# Patient Record
Sex: Male | Born: 1935 | Race: Black or African American | Hispanic: No | Marital: Married | Smoking: Former smoker
Health system: Southern US, Community
[De-identification: ages and names within clinical notes are randomized; demographics above are authoritative.]

## PROBLEM LIST (undated history)

## (undated) DIAGNOSIS — F329 Major depressive disorder, single episode, unspecified: Secondary | ICD-10-CM

## (undated) DIAGNOSIS — K219 Gastro-esophageal reflux disease without esophagitis: Secondary | ICD-10-CM

## (undated) DIAGNOSIS — F32A Depression, unspecified: Secondary | ICD-10-CM

## (undated) DIAGNOSIS — S0990XA Unspecified injury of head, initial encounter: Secondary | ICD-10-CM

## (undated) DIAGNOSIS — K08109 Complete loss of teeth, unspecified cause, unspecified class: Secondary | ICD-10-CM

## (undated) DIAGNOSIS — I10 Essential (primary) hypertension: Secondary | ICD-10-CM

## (undated) DIAGNOSIS — E7800 Pure hypercholesterolemia, unspecified: Secondary | ICD-10-CM

## (undated) DIAGNOSIS — E119 Type 2 diabetes mellitus without complications: Secondary | ICD-10-CM

## (undated) DIAGNOSIS — F419 Anxiety disorder, unspecified: Secondary | ICD-10-CM

## (undated) DIAGNOSIS — F0390 Unspecified dementia without behavioral disturbance: Secondary | ICD-10-CM

## (undated) DIAGNOSIS — R561 Post traumatic seizures: Secondary | ICD-10-CM

## (undated) DIAGNOSIS — D649 Anemia, unspecified: Secondary | ICD-10-CM

## (undated) DIAGNOSIS — Z972 Presence of dental prosthetic device (complete) (partial): Secondary | ICD-10-CM

## (undated) HISTORY — PX: JOINT REPLACEMENT: SHX530

## (undated) HISTORY — PX: APPENDECTOMY: SHX54

---

## 1961-04-30 HISTORY — PX: BACK SURGERY: SHX140

## 2005-08-07 ENCOUNTER — Ambulatory Visit: Payer: Self-pay | Admitting: Unknown Physician Specialty

## 2008-11-02 ENCOUNTER — Ambulatory Visit: Payer: Self-pay | Admitting: Unknown Physician Specialty

## 2008-12-14 ENCOUNTER — Ambulatory Visit: Payer: Self-pay | Admitting: Unknown Physician Specialty

## 2009-09-06 ENCOUNTER — Ambulatory Visit: Payer: Self-pay | Admitting: Ophthalmology

## 2012-03-24 DIAGNOSIS — N401 Enlarged prostate with lower urinary tract symptoms: Secondary | ICD-10-CM | POA: Insufficient documentation

## 2012-03-24 DIAGNOSIS — N400 Enlarged prostate without lower urinary tract symptoms: Secondary | ICD-10-CM | POA: Insufficient documentation

## 2012-03-24 DIAGNOSIS — N529 Male erectile dysfunction, unspecified: Secondary | ICD-10-CM | POA: Insufficient documentation

## 2012-10-15 ENCOUNTER — Ambulatory Visit: Payer: Self-pay

## 2013-04-30 DIAGNOSIS — S065XAA Traumatic subdural hemorrhage with loss of consciousness status unknown, initial encounter: Secondary | ICD-10-CM

## 2013-04-30 HISTORY — DX: Traumatic subdural hemorrhage with loss of consciousness status unknown, initial encounter: S06.5XAA

## 2013-07-07 ENCOUNTER — Emergency Department: Payer: Self-pay | Admitting: Emergency Medicine

## 2013-07-07 DIAGNOSIS — S065X9A Traumatic subdural hemorrhage with loss of consciousness of unspecified duration, initial encounter: Secondary | ICD-10-CM | POA: Insufficient documentation

## 2013-07-07 DIAGNOSIS — R569 Unspecified convulsions: Secondary | ICD-10-CM | POA: Insufficient documentation

## 2013-07-07 DIAGNOSIS — S065XAA Traumatic subdural hemorrhage with loss of consciousness status unknown, initial encounter: Secondary | ICD-10-CM | POA: Insufficient documentation

## 2013-07-07 LAB — CBC WITH DIFFERENTIAL/PLATELET
BASOS PCT: 0.7 %
Basophil #: 0.1 10*3/uL (ref 0.0–0.1)
EOS PCT: 2.5 %
Eosinophil #: 0.2 10*3/uL (ref 0.0–0.7)
HCT: 34.4 % — ABNORMAL LOW (ref 40.0–52.0)
HGB: 11.1 g/dL — ABNORMAL LOW (ref 13.0–18.0)
LYMPHS ABS: 1.7 10*3/uL (ref 1.0–3.6)
Lymphocyte %: 21.7 %
MCH: 30.8 pg (ref 26.0–34.0)
MCHC: 32.3 g/dL (ref 32.0–36.0)
MCV: 96 fL (ref 80–100)
MONO ABS: 0.8 x10 3/mm (ref 0.2–1.0)
Monocyte %: 9.9 %
Neutrophil #: 5.2 10*3/uL (ref 1.4–6.5)
Neutrophil %: 65.2 %
PLATELETS: 196 10*3/uL (ref 150–440)
RBC: 3.6 10*6/uL — AB (ref 4.40–5.90)
RDW: 13.5 % (ref 11.5–14.5)
WBC: 8 10*3/uL (ref 3.8–10.6)

## 2013-07-07 LAB — COMPREHENSIVE METABOLIC PANEL
ALBUMIN: 3.1 g/dL — AB (ref 3.4–5.0)
AST: 12 U/L — AB (ref 15–37)
Alkaline Phosphatase: 86 U/L
Anion Gap: 4 — ABNORMAL LOW (ref 7–16)
BUN: 15 mg/dL (ref 7–18)
Bilirubin,Total: 0.4 mg/dL (ref 0.2–1.0)
CHLORIDE: 103 mmol/L (ref 98–107)
Calcium, Total: 9.1 mg/dL (ref 8.5–10.1)
Co2: 33 mmol/L — ABNORMAL HIGH (ref 21–32)
Creatinine: 0.88 mg/dL (ref 0.60–1.30)
EGFR (African American): >60
EGFR (Non-African Amer.): >60
GLUCOSE: 104 mg/dL — AB (ref 65–99)
Osmolality: 281 (ref 275–301)
Potassium: 3 mmol/L — ABNORMAL LOW (ref 3.5–5.1)
SGPT (ALT): 14 U/L (ref 12–78)
Sodium: 140 mmol/L (ref 136–145)
Total Protein: 6.8 g/dL (ref 6.4–8.2)

## 2013-07-07 LAB — PROTIME-INR
INR: 1.1
Prothrombin Time: 13.6 secs (ref 11.5–14.7)

## 2013-07-09 DIAGNOSIS — M898X5 Other specified disorders of bone, thigh: Secondary | ICD-10-CM | POA: Insufficient documentation

## 2013-09-11 DIAGNOSIS — M169 Osteoarthritis of hip, unspecified: Secondary | ICD-10-CM | POA: Insufficient documentation

## 2014-01-25 DIAGNOSIS — Z87438 Personal history of other diseases of male genital organs: Secondary | ICD-10-CM | POA: Insufficient documentation

## 2014-01-25 DIAGNOSIS — R7302 Impaired glucose tolerance (oral): Secondary | ICD-10-CM | POA: Insufficient documentation

## 2014-01-25 DIAGNOSIS — K30 Functional dyspepsia: Secondary | ICD-10-CM | POA: Insufficient documentation

## 2014-09-08 ENCOUNTER — Encounter: Payer: Self-pay | Admitting: *Deleted

## 2014-09-13 NOTE — Discharge Instructions (Signed)
Follow-Up Appointment is: Thursday, May 19 @ 10:50 am   Cataract Surgery Care After Refer to this sheet in the next few weeks. These instructions provide you with information on caring for yourself after your procedure. Your caregiver may also give you more specific instructions. Your treatment has been planned according to current medical practices, but problems sometimes occur. Call your caregiver if you have any problems or questions after your procedure.  HOME CARE INSTRUCTIONS   Avoid strenuous activities as directed by your caregiver.  Ask your caregiver when you can resume driving.  Use eyedrops or other medicines to help healing and control pressure inside your eye as directed by your caregiver.  Only take over-the-counter or prescription medicines for pain, discomfort, or fever as directed by your caregiver.  Do not to touch or rub your eyes.  You may be instructed to use a protective shield during the first few days and nights after surgery. If not, wear sunglasses to protect your eyes. This is to protect the eye from pressure or from being accidentally bumped.  Keep the area around your eye clean and dry. Avoid swimming or allowing water to hit you directly in the face while showering. Keep soap and shampoo out of your eyes.  Do not bend or lift heavy objects. Bending increases pressure in the eye. You can walk, climb stairs, and do light household chores.  Do not put a contact lens into the eye that had surgery until your caregiver says it is okay to do so.  Ask your doctor when you can return to work. This will depend on the kind of work that you do. If you work in a dusty environment, you may be advised to wear protective eyewear for a period of time.  Ask your caregiver when it will be safe to engage in sexual activity.  Continue with your regular eye exams as directed by your caregiver. What to expect:  It is normal to feel itching and mild discomfort for a few days  after cataract surgery. Some fluid discharge is also common, and your eye may be sensitive to light and touch.  After 1 to 2 days, even moderate discomfort should disappear. In most cases, healing will take about 6 weeks.  If you received an intraocular lens (IOL), you may notice that colors are very bright or have a blue tinge. Also, if you have been in bright sunlight, everything may appear reddish for a few hours. If you see these color tinges, it is because your lens is clear and no longer cloudy. Within a few months after receiving an IOL, these extra colors should go away. When you have healed, you will probably need new glasses. SEEK MEDICAL CARE IF:   You have increased bruising around your eye.  You have discomfort not helped by medicine. SEEK IMMEDIATE MEDICAL CARE IF:   You have a fever.  You have a worsening or sudden vision loss.  You have redness, swelling, or increasing pain in the eye.  You have a thick discharge from the eye that had surgery. MAKE SURE YOU:  Understand these instructions.  Will watch your condition.  Will get help right away if you are not doing well or get worse. Document Released: 11/03/2004 Document Revised: 07/09/2011 Document Reviewed: 12/08/2010 [facility] Patient Information 2015 [place], [place]. This information is not intended to replace advice given to you by your health care provider. Make sure you discuss any questions you have with your health care provider. General Anesthesia,  Care After Refer to this sheet in the next few weeks. These instructions provide you with information on caring for yourself after your procedure. Your health care provider may also give you more specific instructions. Your treatment has been planned according to current medical practices, but problems sometimes occur. Call your health care provider if you have any problems or questions after your procedure. WHAT TO EXPECT AFTER THE PROCEDURE After the procedure, it  is typical to experience:  Sleepiness.  Nausea and vomiting. HOME CARE INSTRUCTIONS  For the first 24 hours after general anesthesia:  Have a responsible person with you.  Do not drive a car. If you are alone, do not take public transportation.  Do not drink alcohol.  Do not take medicine that has not been prescribed by your health care provider.  Do not sign important papers or make important decisions.  You may resume a normal diet and activities as directed by your health care provider.  Change bandages (dressings) as directed.  If you have questions or problems that seem related to general anesthesia, call the hospital and ask for the anesthetist or anesthesiologist on call. SEEK MEDICAL CARE IF:  You have nausea and vomiting that continue the day after anesthesia.  You develop a rash. SEEK IMMEDIATE MEDICAL CARE IF:   You have difficulty breathing.  You have chest pain.  You have any allergic problems. Document Released: 07/23/2000 Document Revised: 04/21/2013 Document Reviewed: 10/30/2012 [facility] Patient Information 2015 [place], [place]. This information is not intended to replace advice given to you by your health care provider. Make sure you discuss any questions you have with your health care provider.

## 2014-09-15 ENCOUNTER — Encounter
Admission: RE | Disposition: A | Discharge status: Home / Self Care | Payer: Self-pay | Source: Ambulatory Visit | Attending: Ophthalmology

## 2014-09-15 ENCOUNTER — Ambulatory Visit: Payer: Medicare PPO | Admitting: Anesthesiology

## 2014-09-15 ENCOUNTER — Ambulatory Visit
Admission: RE | Admit: 2014-09-15 | Discharge: 2014-09-15 | Disposition: A | Discharge status: Home / Self Care | Payer: Medicare PPO | Source: Ambulatory Visit | Attending: Ophthalmology | Admitting: Ophthalmology

## 2014-09-15 ENCOUNTER — Encounter: Payer: Self-pay | Admitting: Anesthesiology

## 2014-09-15 DIAGNOSIS — R569 Unspecified convulsions: Secondary | ICD-10-CM | POA: Insufficient documentation

## 2014-09-15 DIAGNOSIS — Z79899 Other long term (current) drug therapy: Secondary | ICD-10-CM | POA: Insufficient documentation

## 2014-09-15 DIAGNOSIS — I10 Essential (primary) hypertension: Secondary | ICD-10-CM | POA: Insufficient documentation

## 2014-09-15 DIAGNOSIS — K219 Gastro-esophageal reflux disease without esophagitis: Secondary | ICD-10-CM | POA: Diagnosis not present

## 2014-09-15 DIAGNOSIS — F329 Major depressive disorder, single episode, unspecified: Secondary | ICD-10-CM | POA: Diagnosis not present

## 2014-09-15 DIAGNOSIS — Z79891 Long term (current) use of opiate analgesic: Secondary | ICD-10-CM | POA: Insufficient documentation

## 2014-09-15 DIAGNOSIS — E780 Pure hypercholesterolemia: Secondary | ICD-10-CM | POA: Insufficient documentation

## 2014-09-15 DIAGNOSIS — H2512 Age-related nuclear cataract, left eye: Secondary | ICD-10-CM | POA: Insufficient documentation

## 2014-09-15 DIAGNOSIS — Z96652 Presence of left artificial knee joint: Secondary | ICD-10-CM | POA: Insufficient documentation

## 2014-09-15 DIAGNOSIS — M1990 Unspecified osteoarthritis, unspecified site: Secondary | ICD-10-CM | POA: Diagnosis not present

## 2014-09-15 DIAGNOSIS — Z86010 Personal history of colonic polyps: Secondary | ICD-10-CM | POA: Insufficient documentation

## 2014-09-15 DIAGNOSIS — H269 Unspecified cataract: Secondary | ICD-10-CM | POA: Diagnosis present

## 2014-09-15 DIAGNOSIS — Z87820 Personal history of traumatic brain injury: Secondary | ICD-10-CM | POA: Insufficient documentation

## 2014-09-15 DIAGNOSIS — E119 Type 2 diabetes mellitus without complications: Secondary | ICD-10-CM | POA: Insufficient documentation

## 2014-09-15 DIAGNOSIS — Z87891 Personal history of nicotine dependence: Secondary | ICD-10-CM | POA: Diagnosis not present

## 2014-09-15 HISTORY — DX: Presence of dental prosthetic device (complete) (partial): Z97.2

## 2014-09-15 HISTORY — DX: Essential (primary) hypertension: I10

## 2014-09-15 HISTORY — DX: Unspecified dementia, unspecified severity, without behavioral disturbance, psychotic disturbance, mood disturbance, and anxiety: F03.90

## 2014-09-15 HISTORY — DX: Anemia, unspecified: D64.9

## 2014-09-15 HISTORY — PX: CATARACT EXTRACTION W/PHACO: SHX586

## 2014-09-15 HISTORY — DX: Post traumatic seizures: R56.1

## 2014-09-15 HISTORY — DX: Complete loss of teeth, unspecified cause, unspecified class: K08.109

## 2014-09-15 HISTORY — DX: Anxiety disorder, unspecified: F41.9

## 2014-09-15 HISTORY — DX: Unspecified injury of head, initial encounter: S09.90XA

## 2014-09-15 LAB — GLUCOSE, CAPILLARY: GLUCOSE-CAPILLARY: 164 mg/dL — AB (ref 65–99)

## 2014-09-15 SURGERY — PHACOEMULSIFICATION, CATARACT, WITH IOL INSERTION
Anesthesia: Monitor Anesthesia Care | Laterality: Left | Wound class: Clean

## 2014-09-15 MED ORDER — CEFUROXIME OPHTHALMIC INJECTION 1 MG/0.1 ML
INJECTION | OPHTHALMIC | Status: DC | PRN
  Administered 2014-09-15: .3 mL via INTRACAMERAL

## 2014-09-15 MED ORDER — BSS IO SOLN
INTRAOCULAR | Status: DC | PRN
  Administered 2014-09-15: 15 mL via INTRAOCULAR
  Administered 2014-09-15: 500 mL via INTRAOCULAR

## 2014-09-15 MED ORDER — MIDAZOLAM HCL 2 MG/2ML IJ SOLN
INTRAMUSCULAR | Status: DC | PRN
  Administered 2014-09-15: 1 mg via INTRAVENOUS

## 2014-09-15 MED ORDER — CARBACHOL 0.01 % IO SOLN
INTRAOCULAR | Status: DC | PRN
  Administered 2014-09-15: .2 mL via INTRAOCULAR

## 2014-09-15 MED ORDER — TIMOLOL MALEATE 0.5 % OP SOLN
OPHTHALMIC | Status: DC | PRN
  Administered 2014-09-15: 1 [drp] via OPHTHALMIC

## 2014-09-15 MED ORDER — TETRACAINE HCL 0.5 % OP SOLN
1.0000 [drp] | Freq: Once | OPHTHALMIC | Status: AC
  Administered 2014-09-15: 1 [drp] via OPHTHALMIC

## 2014-09-15 MED ORDER — NA HYALUR & NA CHOND-NA HYALUR 0.4-0.35 ML IO KIT
PACK | INTRAOCULAR | Status: DC | PRN
  Administered 2014-09-15: 1 mL via INTRAOCULAR

## 2014-09-15 MED ORDER — ARMC OPHTHALMIC DILATING GEL
1.0000 "application " | OPHTHALMIC | Status: DC | PRN
  Administered 2014-09-15 (×2): 1 via OPHTHALMIC

## 2014-09-15 MED ORDER — EPINEPHRINE HCL 1 MG/ML IJ SOLN
INTRAMUSCULAR | Status: DC | PRN
  Administered 2014-09-15: 1 mg

## 2014-09-15 MED ORDER — ACETAMINOPHEN 325 MG PO TABS: 325.0000 mg | ORAL_TABLET | ORAL | Status: DC | PRN

## 2014-09-15 MED ORDER — FENTANYL CITRATE (PF) 100 MCG/2ML IJ SOLN
INTRAMUSCULAR | Status: DC | PRN
  Administered 2014-09-15: 50 ug via INTRAVENOUS

## 2014-09-15 MED ORDER — BRIMONIDINE TARTRATE 0.2 % OP SOLN
OPHTHALMIC | Status: DC | PRN
  Administered 2014-09-15: 1 [drp] via OPHTHALMIC

## 2014-09-15 MED ORDER — ACETAMINOPHEN 160 MG/5ML PO SOLN: 325.0000 mg | ORAL | Status: DC | PRN

## 2014-09-15 MED ORDER — POVIDONE-IODINE 5 % OP SOLN
1.0000 "application " | Freq: Once | OPHTHALMIC | Status: AC
  Administered 2014-09-15: 1 via OPHTHALMIC

## 2014-09-15 SURGICAL SUPPLY — 27 items
CANNULA ANT/CHMB 27G (MISCELLANEOUS) ×1 IMPLANT
CANNULA ANT/CHMB 27GA (MISCELLANEOUS) ×6 IMPLANT
GLOVE SURG LX 7.5 STRW (GLOVE) ×2
GLOVE SURG LX STRL 7.5 STRW (GLOVE) ×1 IMPLANT
GLOVE SURG TRIUMPH 8.0 PF LTX (GLOVE) ×3 IMPLANT
GOWN STRL REUS W/ TWL LRG LVL3 (GOWN DISPOSABLE) ×2 IMPLANT
GOWN STRL REUS W/TWL LRG LVL3 (GOWN DISPOSABLE) ×6
LENS IOL ACRSF IQ PC 19.0 (Intraocular Lens) IMPLANT
LENS IOL ACRYSOF IQ POST 19.0 (Intraocular Lens) ×3 IMPLANT
MARKER SKIN SURG W/RULER VIO (MISCELLANEOUS) ×3 IMPLANT
NDL FILTER BLUNT 18X1 1/2 (NEEDLE) ×1 IMPLANT
NDL RETROBULBAR .5 NSTRL (NEEDLE) IMPLANT
NEEDLE FILTER BLUNT 18X 1/2SAF (NEEDLE) ×4
NEEDLE FILTER BLUNT 18X1 1/2 (NEEDLE) ×2 IMPLANT
PACK CATARACT BRASINGTON (MISCELLANEOUS) ×3 IMPLANT
PACK EYE AFTER SURG (MISCELLANEOUS) ×3 IMPLANT
PACK OPTHALMIC (MISCELLANEOUS) ×3 IMPLANT
RING MALYGIN 7.0 (MISCELLANEOUS) IMPLANT
SUT ETHILON 10-0 CS-B-6CS-B-6 (SUTURE)
SUT VICRYL  9 0 (SUTURE)
SUT VICRYL 9 0 (SUTURE) IMPLANT
SUTURE EHLN 10-0 CS-B-6CS-B-6 (SUTURE) IMPLANT
SYR 3ML LL SCALE MARK (SYRINGE) ×5 IMPLANT
SYR 5ML LL (SYRINGE) IMPLANT
SYR TB 1ML LUER SLIP (SYRINGE) ×3 IMPLANT
WATER STERILE IRR 500ML POUR (IV SOLUTION) ×3 IMPLANT
WIPE NON LINTING 3.25X3.25 (MISCELLANEOUS) ×3 IMPLANT

## 2014-09-15 NOTE — Anesthesia Postprocedure Evaluation (Signed)
  Anesthesia Post-op Note  Patient: Tim Henry, Tim Henry  Procedure(s) Performed: Procedure(s) with comments: CATARACT EXTRACTION PHACO AND INTRAOCULAR LENS PLACEMENT (IOC) (Left) - DIABETIC PT WOULD LIKE TO HAVE ARRIVAL TIME LATE AM  Anesthesia type:MAC  Patient location: PACU  Post pain: Pain level controlled  Post assessment: Post-op Vital signs reviewed, Patient's Cardiovascular Status Stable, Respiratory Function Stable, Patent Airway and No signs of Nausea or vomiting  Post vital signs: Reviewed and stable  Last Vitals:  Filed Vitals:   09/15/14 1214  BP:   Pulse:   Temp: 36.5 C  Resp:     Level of consciousness: awake, alert  and patient cooperative  Complications: No apparent anesthesia complications

## 2014-09-15 NOTE — H&P (Signed)
  The History and Physical notes were scanned in.  The patient remains stable and unchanged from the H&P.   Previous H&P reviewed, patient examined, and there are no changes.  Tim Henry 09/15/2014 10:37 AM

## 2014-09-15 NOTE — Transfer of Care (Signed)
Immediate Anesthesia Transfer of Care Note  Patient: Tim Henry  Procedure(s) Performed: Procedure(s) with comments: CATARACT EXTRACTION PHACO AND INTRAOCULAR LENS PLACEMENT (IOC) (Left) - DIABETIC PT WOULD LIKE TO HAVE ARRIVAL TIME LATE AM  Patient Location: PACU  Anesthesia Type: MAC  Level of Consciousness: awake, alert  and patient cooperative  Airway and Oxygen Therapy: Patient Spontanous Breathing and Patient connected to supplemental oxygen  Post-op Assessment: Post-op Vital signs reviewed, Patient's Cardiovascular Status Stable, Respiratory Function Stable, Patent Airway and No signs of Nausea or vomiting  Post-op Vital Signs: Reviewed and stable  Complications: No apparent anesthesia complications

## 2014-09-15 NOTE — Anesthesia Preprocedure Evaluation (Signed)
Anesthesia Evaluation  Patient identified by MRN, date of birth, ID band  Reviewed: Allergy & Precautions, H&P , NPO status , Patient's Chart, lab work & pertinent test results  Airway Mallampati: II  TM Distance: >3 FB Neck ROM: full    Dental no notable dental hx. (+) Upper Dentures, Lower Dentures   Pulmonary former smoker,    Pulmonary exam normal       Cardiovascular hypertension, Rhythm:regular Rate:Normal     Neuro/Psych Seizures -,     GI/Hepatic   Endo/Other    Renal/GU      Musculoskeletal   Abdominal   Peds  Hematology   Anesthesia Other Findings   Reproductive/Obstetrics                             Anesthesia Physical Anesthesia Plan  ASA: II  Anesthesia Plan: MAC   Post-op Pain Management:    Induction:   Airway Management Planned:   Additional Equipment:   Intra-op Plan:   Post-operative Plan:   Informed Consent: I have reviewed the patients History and Physical, chart, labs and discussed the procedure including the risks, benefits and alternatives for the proposed anesthesia with the patient or authorized representative who has indicated his/her understanding and acceptance.     Plan Discussed with: CRNA  Anesthesia Plan Comments:         Anesthesia Quick Evaluation

## 2014-09-15 NOTE — Op Note (Signed)
OPERATIVE NOTE  Tim Henry [id number] 09/15/2014   PREOPERATIVE DIAGNOSIS:  Nuclear sclerotic cataract left eye. H25.12   POSTOPERATIVE DIAGNOSIS:    Nuclear sclerotic cataract left eye.     PROCEDURE:  Phacoemusification with posterior chamber intraocular lens placement of the left eye   LENS:   Implant Name Type Inv. Item Serial No. Manufacturer Lot No. LRB No. Used  IMPLANT LENS - [id number] Intraocular Lens IMPLANT LENS [id number] 169 [employer]CON   Left 1     [id number] 22.0 diopter PCIOL   ULTRASOUND TIME: 13 of 1 minutes 20 seconds, CDE 10.8  SURGEON:  Deirdre Evenerhadwick R. Sweta Halseth, MD   ANESTHESIA:  Topical with tetracaine drops and 2% Xylocaine jelly.   COMPLICATIONS:  None.   DESCRIPTION OF PROCEDURE:  The patient was identified in the holding room and transported to the operating room and placed in the supine position under the operating microscope.  The left eye was identified as the operative eye and it was prepped and draped in the usual sterile ophthalmic fashion.   A 1 millimeter clear-corneal paracentesis was made at the 1:30 position.  The anterior chamber was filled with Viscoat viscoelastic.  A 2.4 millimeter keratome was used to make a near-clear corneal incision at the 10:30 position.  .  A curvilinear capsulorrhexis was made with a cystotome and capsulorrhexis forceps.  Balanced salt solution was used to hydrodissect and hydrodelineate the nucleus.   Phacoemulsification was then used in stop and chop fashion to remove the lens nucleus and epinucleus.  The remaining cortex was then removed using the irrigation and aspiration handpiece. Provisc was then placed into the capsular bag to distend it for lens placement.  A lens was then injected into the capsular bag.  The remaining viscoelastic was aspirated.   Wounds were hydrated with balanced salt solution.  The anterior chamber was inflated to a physiologic pressure with balanced salt solution. Miostat was placed into the anterior  chamber.  No wound leaks were noted. Cefuroxime 0.1 ml of a 10mg /ml solution was injected into the anterior chamber for a dose of 1 mg of intracameral antibiotic at the completion of the case.   Timolol and Brimonidine drops were applied to the eye.  The patient was taken to the recovery room in stable condition without complications of anesthesia or surgery.  [name] 09/15/2014, 12:11 PM

## 2014-09-16 ENCOUNTER — Encounter: Payer: Self-pay | Admitting: Ophthalmology

## 2015-01-11 IMAGING — CT CT HEAD WITHOUT CONTRAST
3 of 5 series · 14 of 47 positions shown, 16 images · non-contrast
Comparison: none

[Series 6: sagittal bone · sagittal · 0.37mm/px · 3 of 76 slices shown]
[im 26/76  brain]
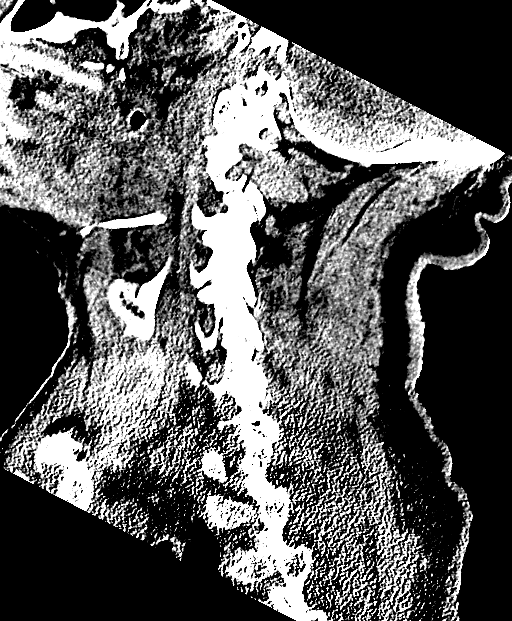
[im 38/76  brain]
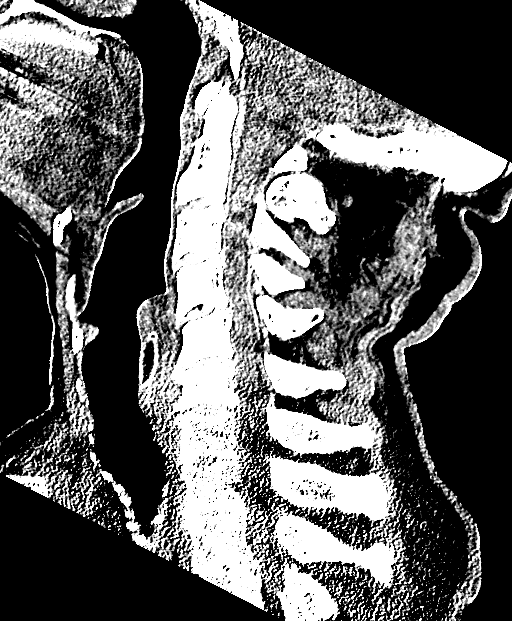
[im 51/76  brain]
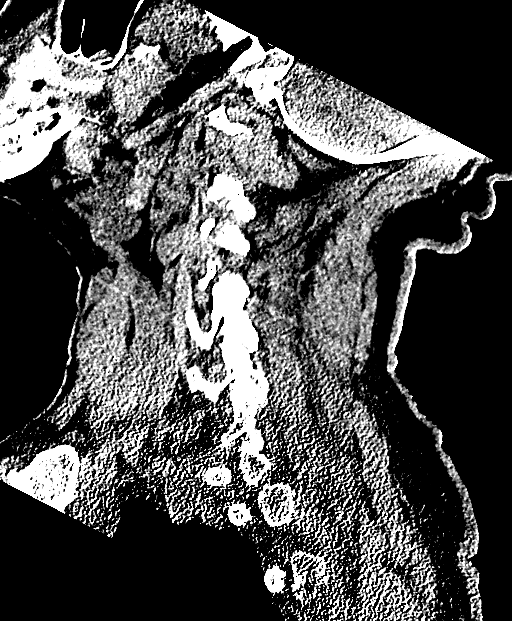

[Series 7: coronal bone · coronal · 0.37mm/px · 3 of 52 slices shown]
[im 18/52  brain]
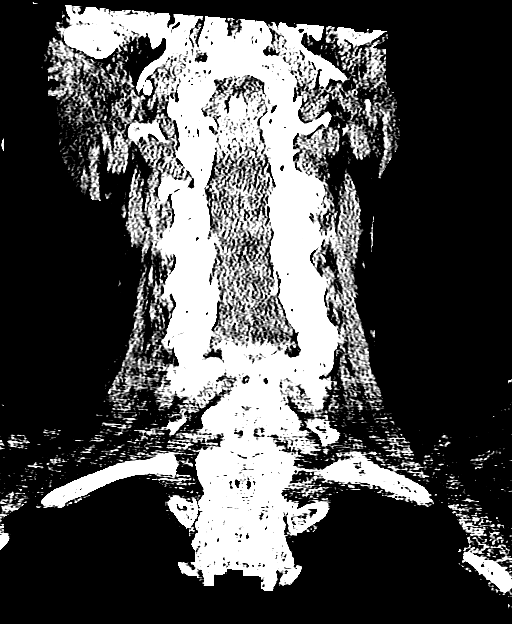
[im 23/52  brain]
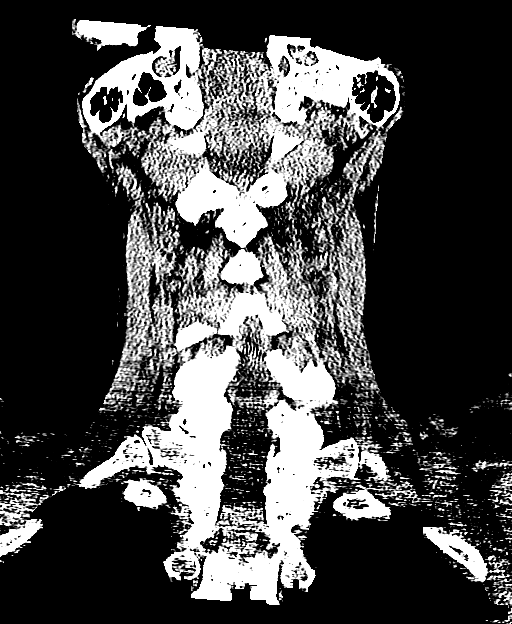
[im 29/52  brain]
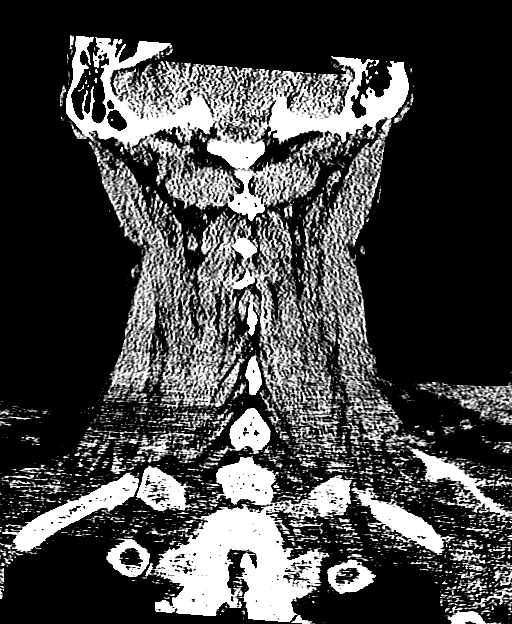

[Series 8: axial · axial · 0.31mm/px · z∈[-216,-62]mm · 8 of 105 slices shown, 10 images]
[im 9/105  brain]
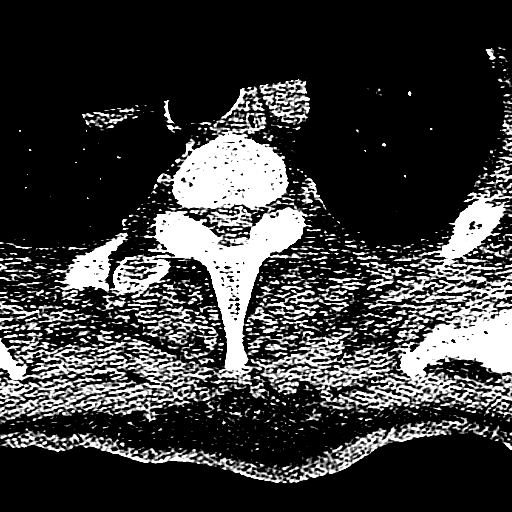
[im 9/105  bone]
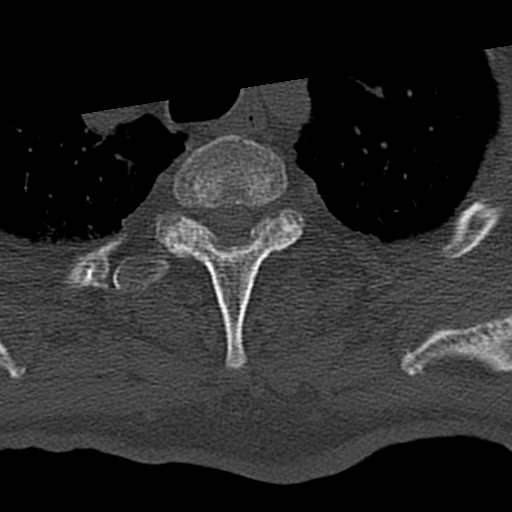
[im 25/105  brain]
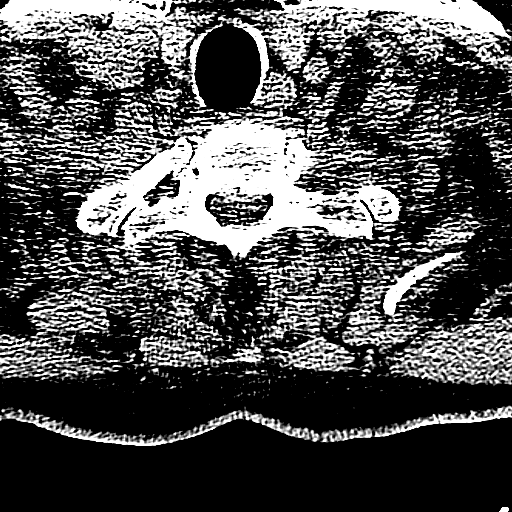
[im 33/105  brain]
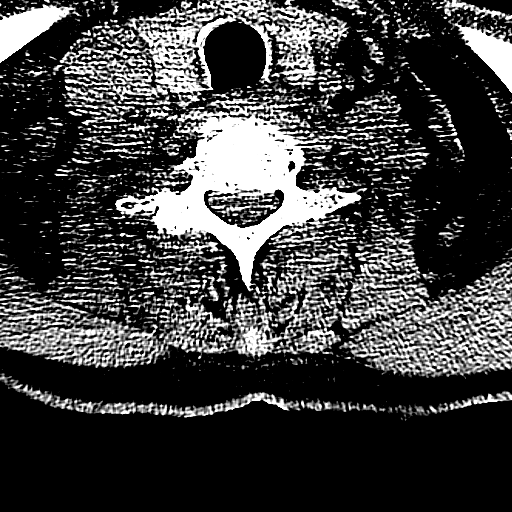
[im 49/105  brain]
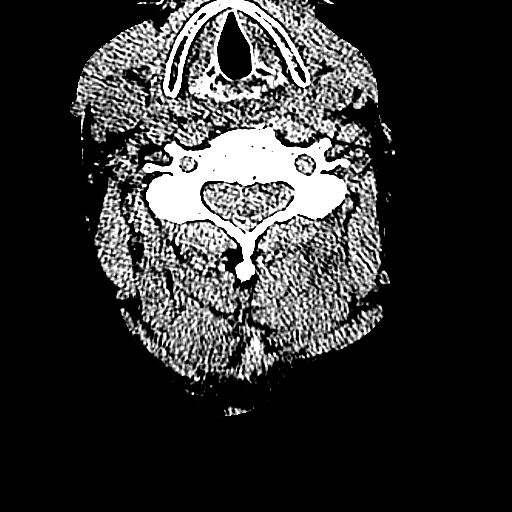
[im 57/105  brain]
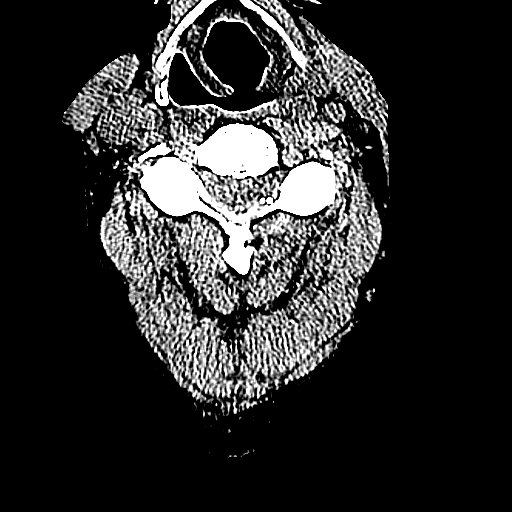
[im 57/105  bone]
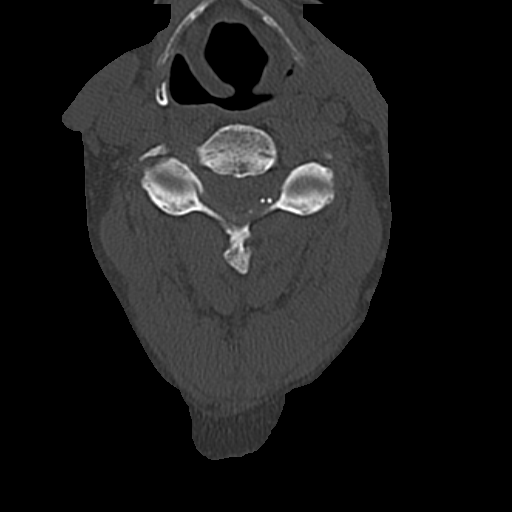
[im 73/105  brain]
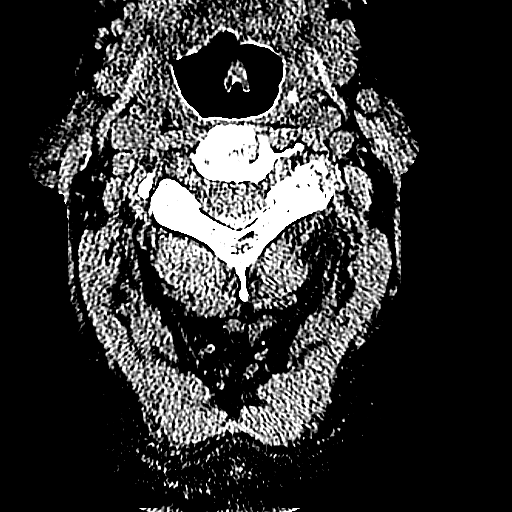
[im 81/105  brain]
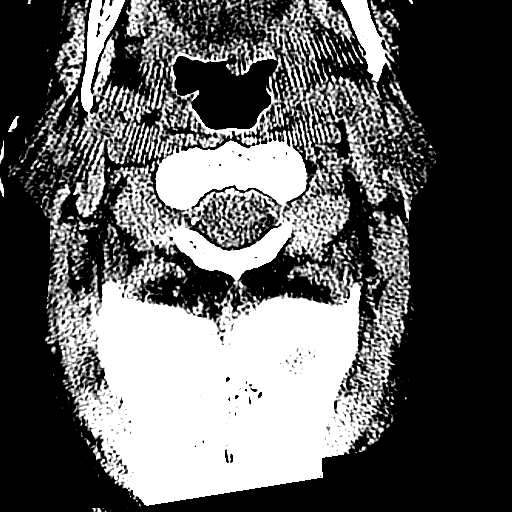
[im 97/105  brain]
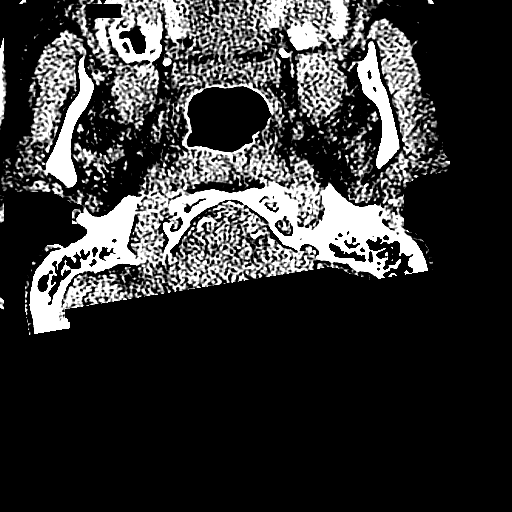

[14 of 47 positions shown; findings below may reference images not displayed]

CLINICAL DATA
Fall 3 weeks ago, healing head laceration, headache, neck pain

EXAM
CT HEAD WITHOUT CONTRAST

CT CERVICAL SPINE WITHOUT CONTRAST

TECHNIQUE
Multidetector CT imaging of the head and cervical spine was
performed following the standard protocol without intravenous
contrast. Multiplanar CT image reconstructions of the cervical spine
were also generated.

COMPARISON
None.

FINDINGS
CT HEAD FINDINGS

Moderate subacute to chronic subdural hematoma overlying the right
hemisphere, measuring up to 22 mm in thickness (series 2/image 15).

Underlying mass effect with 9 mm leftward midline shift.

No CT evidence of acute infarction.

Basal cisterns are patent.

Cerebral volume is within normal limits.  No ventriculomegaly.

The visualized paranasal sinuses are essentially clear. The mastoid
air cells are unopacified.

No evidence of calvarial fracture.

CT CERVICAL SPINE FINDINGS

Straightening of the cervical spine.

No evidence of fracture or dislocation. Vertebral body heights are
maintained. Dens appears intact.

No prevertebral soft tissue swelling.

Mild to moderate multilevel degenerative changes, most prominent at
C5-6.

Visualized thyroid is unremarkable.

Visualized lung apices are notable for paraseptal emphysematous
changes.

IMPRESSION
Moderate subacute to chronic subdural hematoma overlying the right
hemisphere, measuring up to 22 mm in thickness.

Underlying mass effect with 9 mm leftward midline shift. Basal
cisterns are patent.

No evidence of traumatic injury to the cervical spine. Mild to
moderate degenerative changes.

Critical value/emergent results were called by telephone at the time
of interpretation on 07/07/2013 at [DATE] to Dr. ELIE ROGER LISETTE , who
verbally acknowledged these results.

SIGNATURE

## 2015-11-19 DIAGNOSIS — R3129 Other microscopic hematuria: Secondary | ICD-10-CM | POA: Insufficient documentation

## 2016-01-18 ENCOUNTER — Encounter: Payer: Self-pay | Admitting: *Deleted

## 2016-01-20 NOTE — Discharge Instructions (Signed)

## 2016-01-23 ENCOUNTER — Encounter
Admission: RE | Disposition: A | Discharge status: Home / Self Care | Payer: Self-pay | Source: Ambulatory Visit | Attending: Ophthalmology

## 2016-01-23 ENCOUNTER — Ambulatory Visit: Payer: Medicare Other | Admitting: Anesthesiology

## 2016-01-23 ENCOUNTER — Ambulatory Visit
Admission: RE | Admit: 2016-01-23 | Discharge: 2016-01-23 | Disposition: A | Discharge status: Home / Self Care | Payer: Medicare Other | Source: Ambulatory Visit | Attending: Ophthalmology | Admitting: Ophthalmology

## 2016-01-23 ENCOUNTER — Encounter: Payer: Self-pay | Admitting: *Deleted

## 2016-01-23 DIAGNOSIS — Z79899 Other long term (current) drug therapy: Secondary | ICD-10-CM | POA: Insufficient documentation

## 2016-01-23 DIAGNOSIS — I10 Essential (primary) hypertension: Secondary | ICD-10-CM | POA: Diagnosis not present

## 2016-01-23 DIAGNOSIS — M1990 Unspecified osteoarthritis, unspecified site: Secondary | ICD-10-CM | POA: Insufficient documentation

## 2016-01-23 DIAGNOSIS — Z86010 Personal history of colonic polyps: Secondary | ICD-10-CM | POA: Diagnosis not present

## 2016-01-23 DIAGNOSIS — Z87820 Personal history of traumatic brain injury: Secondary | ICD-10-CM | POA: Diagnosis not present

## 2016-01-23 DIAGNOSIS — Z9049 Acquired absence of other specified parts of digestive tract: Secondary | ICD-10-CM | POA: Diagnosis not present

## 2016-01-23 DIAGNOSIS — F329 Major depressive disorder, single episode, unspecified: Secondary | ICD-10-CM | POA: Insufficient documentation

## 2016-01-23 DIAGNOSIS — K589 Irritable bowel syndrome without diarrhea: Secondary | ICD-10-CM | POA: Diagnosis not present

## 2016-01-23 DIAGNOSIS — Z9889 Other specified postprocedural states: Secondary | ICD-10-CM | POA: Insufficient documentation

## 2016-01-23 DIAGNOSIS — Z96652 Presence of left artificial knee joint: Secondary | ICD-10-CM | POA: Insufficient documentation

## 2016-01-23 DIAGNOSIS — K219 Gastro-esophageal reflux disease without esophagitis: Secondary | ICD-10-CM | POA: Diagnosis not present

## 2016-01-23 DIAGNOSIS — E7800 Pure hypercholesterolemia, unspecified: Secondary | ICD-10-CM | POA: Insufficient documentation

## 2016-01-23 DIAGNOSIS — Z87891 Personal history of nicotine dependence: Secondary | ICD-10-CM | POA: Diagnosis not present

## 2016-01-23 DIAGNOSIS — H401113 Primary open-angle glaucoma, right eye, severe stage: Secondary | ICD-10-CM | POA: Diagnosis present

## 2016-01-23 DIAGNOSIS — E119 Type 2 diabetes mellitus without complications: Secondary | ICD-10-CM | POA: Insufficient documentation

## 2016-01-23 HISTORY — DX: Gastro-esophageal reflux disease without esophagitis: K21.9

## 2016-01-23 HISTORY — DX: Major depressive disorder, single episode, unspecified: F32.9

## 2016-01-23 HISTORY — DX: Pure hypercholesterolemia, unspecified: E78.00

## 2016-01-23 HISTORY — DX: Depression, unspecified: F32.A

## 2016-01-23 HISTORY — PX: PHOTOCOAGULATION WITH LASER: SHX6027

## 2016-01-23 HISTORY — DX: Type 2 diabetes mellitus without complications: E11.9

## 2016-01-23 LAB — GLUCOSE, CAPILLARY
GLUCOSE-CAPILLARY: 154 mg/dL — AB (ref 65–99)
GLUCOSE-CAPILLARY: 172 mg/dL — AB (ref 65–99)

## 2016-01-23 SURGERY — PHOTOCOAGULATION, EYE, USING LASER
Anesthesia: Monitor Anesthesia Care | Laterality: Right | Wound class: Clean Contaminated

## 2016-01-23 MED ORDER — MIDAZOLAM HCL 2 MG/2ML IJ SOLN
INTRAMUSCULAR | Status: DC | PRN
  Administered 2016-01-23 (×2): 1 mg via INTRAVENOUS

## 2016-01-23 MED ORDER — ALFENTANIL 500 MCG/ML IJ INJ
INJECTION | INTRAMUSCULAR | Status: DC | PRN
  Administered 2016-01-23 (×2): 300 ug via INTRAVENOUS
  Administered 2016-01-23: 200 ug via INTRAVENOUS
  Administered 2016-01-23 (×4): 300 ug via INTRAVENOUS

## 2016-01-23 SURGICAL SUPPLY — 12 items
BANDAGE EYE OVAL (MISCELLANEOUS) ×6 IMPLANT
DEVICE G-PROBE SGL USE (Laser) IMPLANT
DEVICE MICRO PULS P3 SGL USE (Laser) ×2 IMPLANT
G-PROBE SGL USE (Laser)
GAUZE SPONGE 4X4 12PLY STRL (GAUZE/BANDAGES/DRESSINGS) ×3 IMPLANT
NDL FILTER BLUNT 18X1 1/2 (NEEDLE) ×1 IMPLANT
NDL RETROBULBAR .5 NSTRL (NEEDLE) ×3 IMPLANT
NEEDLE FILTER BLUNT 18X 1/2SAF (NEEDLE) ×2
NEEDLE FILTER BLUNT 18X1 1/2 (NEEDLE) ×1 IMPLANT
SYRINGE 10CC LL (SYRINGE) ×3 IMPLANT
WATER STERILE IRR 250ML POUR (IV SOLUTION) ×3 IMPLANT
WATER STERILE IRR 500ML POUR (IV SOLUTION) IMPLANT

## 2016-01-23 NOTE — Anesthesia Preprocedure Evaluation (Signed)
Anesthesia Evaluation  Patient identified by MRN, date of birth, ID band  Reviewed: Allergy & Precautions, H&P , NPO status , Patient's Chart, lab work & pertinent test results  Airway Mallampati: II  TM Distance: >3 FB Neck ROM: full    Dental no notable dental hx. (+) Upper Dentures, Lower Dentures   Pulmonary former smoker,    Pulmonary exam normal        Cardiovascular hypertension, Pt. on medications  Rhythm:regular Rate:Normal     Neuro/Psych Seizures -,     GI/Hepatic GERD  Medicated and Controlled,  Endo/Other  diabetes  Renal/GU      Musculoskeletal   Abdominal   Peds  Hematology   Anesthesia Other Findings   Reproductive/Obstetrics                             Anesthesia Physical  Anesthesia Plan  ASA: II  Anesthesia Plan: MAC   Post-op Pain Management:    Induction: Intravenous  Airway Management Planned: Natural Airway  Additional Equipment:   Intra-op Plan:   Post-operative Plan:   Informed Consent: I have reviewed the patients History and Physical, chart, labs and discussed the procedure including the risks, benefits and alternatives for the proposed anesthesia with the patient or authorized representative who has indicated his/her understanding and acceptance.     Plan Discussed with: CRNA  Anesthesia Plan Comments:         Anesthesia Quick Evaluation

## 2016-01-23 NOTE — H&P (Signed)
H+P reviewed and is up to date, please see paper chart.  

## 2016-01-23 NOTE — Anesthesia Postprocedure Evaluation (Signed)
Anesthesia Post Note  Patient: Tim Henry, Tim Henry  Procedure(s) Performed: Procedure(s) (LRB): PHOTOCOAGULATION WITH LASER (Right)  Patient location during evaluation: PACU Anesthesia Type: MAC Level of consciousness: awake and alert Pain management: pain level controlled Vital Signs Assessment: post-procedure vital signs reviewed and stable Respiratory status: spontaneous breathing, nonlabored ventilation, respiratory function stable and patient connected to nasal cannula oxygen Cardiovascular status: stable and blood pressure returned to baseline Anesthetic complications: no Comments: Family will have an extra person at home to help transfer pt from car to house. D/w surgeon, pt and pt's son. Pt's son comfortable taking pt home.    Tim Henry, Tim Henry

## 2016-01-23 NOTE — OR Nursing (Signed)
A micropulse probe was applied to each hemilimbus with the following settings: , 31.3% duty cycle, 120 seconds.Right eye.

## 2016-01-23 NOTE — Transfer of Care (Signed)
Immediate Anesthesia Transfer of Care Note  Patient: Tim Henry  Procedure(s) Performed: Procedure(s) with comments: PHOTOCOAGULATION WITH LASER (Right) - DIABETIC - oral meds RIGHT Requests arrival 10AM or after  Patient Location: PACU  Anesthesia Type: MAC  Level of Consciousness: awake, alert  and patient cooperative  Airway and Oxygen Therapy: Patient Spontanous Breathing and Patient connected to supplemental oxygen  Post-op Assessment: Post-op Vital signs reviewed, Patient's Cardiovascular Status Stable, Respiratory Function Stable, Patent Airway and No signs of Nausea or vomiting  Post-op Vital Signs: Reviewed and stable  Complications: No apparent anesthesia complications

## 2016-01-23 NOTE — Op Note (Signed)
DATE OF SURGERY: 01/23/2016  PREOPERATIVE DIAGNOSES: Severe stage primary open angle glaucoma, right eye  POSTOPERATIVE DIAGNOSES: Same  PROCEDURES PERFORMED: Transscleral diode cyclophotocoagulation, right eye  SURGEON: Devin GoingAnita P. Vin, M.D.  ANESTHESIA: MAC  COMPLICATIONS: None.  INDICATIONS FOR PROCEDURE: Tim Henry is a 82 y.o. year-old male with uncontrolled primary open angle glaucoma. The risks and benefits of glaucoma surgery were discussed with the patient, and he consented for a diode laser surgery.  PROCEDURE IN DETAIL: The eye for surgery was verified during the time-out procedure in the operating room. A micropulse probe was applied to each hemilimbus with the following settings: 2000mW, 31.3% duty cycle, 120 seconds. The eye was pressure patched closed. The patient tolerated the procedure well and was transferred to the Post-operative Care Unit in stable condition.

## 2016-01-25 ENCOUNTER — Encounter: Payer: Self-pay | Admitting: Ophthalmology

## 2017-02-07 DIAGNOSIS — R7303 Prediabetes: Secondary | ICD-10-CM | POA: Insufficient documentation

## 2017-02-13 DIAGNOSIS — R519 Headache, unspecified: Secondary | ICD-10-CM | POA: Insufficient documentation

## 2017-02-13 DIAGNOSIS — R2681 Unsteadiness on feet: Secondary | ICD-10-CM | POA: Insufficient documentation

## 2017-02-13 DIAGNOSIS — R51 Headache: Secondary | ICD-10-CM

## 2017-02-13 DIAGNOSIS — R413 Other amnesia: Secondary | ICD-10-CM | POA: Insufficient documentation

## 2017-03-01 ENCOUNTER — Encounter (INDEPENDENT_AMBULATORY_CARE_PROVIDER_SITE_OTHER): Payer: Self-pay | Admitting: Vascular Surgery

## 2017-03-01 ENCOUNTER — Ambulatory Visit (INDEPENDENT_AMBULATORY_CARE_PROVIDER_SITE_OTHER): Payer: Medicare Other | Admitting: Vascular Surgery

## 2017-03-01 VITALS — BP 148/74 | HR 87 | Resp 16 | Ht 71.0 in | Wt 242.0 lb

## 2017-03-01 DIAGNOSIS — I10 Essential (primary) hypertension: Secondary | ICD-10-CM | POA: Diagnosis not present

## 2017-03-01 DIAGNOSIS — R600 Localized edema: Secondary | ICD-10-CM | POA: Diagnosis not present

## 2017-03-01 DIAGNOSIS — M79604 Pain in right leg: Secondary | ICD-10-CM

## 2017-03-01 DIAGNOSIS — E785 Hyperlipidemia, unspecified: Secondary | ICD-10-CM

## 2017-03-01 DIAGNOSIS — E118 Type 2 diabetes mellitus with unspecified complications: Secondary | ICD-10-CM

## 2017-03-01 DIAGNOSIS — M79605 Pain in left leg: Secondary | ICD-10-CM

## 2017-03-01 DIAGNOSIS — L03119 Cellulitis of unspecified part of limb: Secondary | ICD-10-CM

## 2017-03-01 MED ORDER — DOXYCYCLINE HYCLATE 100 MG PO CAPS: 100.0000 mg | ORAL_CAPSULE | Freq: Every day | ORAL | 0 refills | Status: DC

## 2017-03-01 MED ORDER — DOXYCYCLINE HYCLATE 100 MG PO CAPS: 100.0000 mg | ORAL_CAPSULE | Freq: Every day | ORAL | 0 refills | Status: AC

## 2017-03-01 NOTE — Progress Notes (Signed)
Subjective:    Patient ID: Tim Henry, male    DOB: 1927-05-05, 83 y.o.   MRN: [id number] Chief Complaint  Patient presents with  . New Patient (Initial Visit)    leg pain and swelling   Presents as a new patient self-referred for evaluation of bilateral lower extremity edema. Patient with a long-standing history of edema to the bilateral lower extremities which has progressively worsened over the last few years. Seen with daughter. Patient with some dementia - history is obtained from both the patient and the daughter. The patient experiences worsening edema as the day progresses or with standing for long periods of time. The patient notes a discomfort associated with the edema. His discomfort has also progressed to the point he is unable to function on a daily basis without significant pain. The patient has been prone to recurrent cellulitis. The patient notes ulcer development to the lateral aspect of his right calf. This has been present for a few weeks and is slow to heal. At this time, the patient does not engage in conservative therapy. The patient denies any surgery or trauma to the lower extremity. Patient denies any DVT history. Patient denies any fever, nausea or vomiting.   Review of Systems  Constitutional: Negative.   HENT: Negative.   Eyes: Negative.   Respiratory: Negative.   Cardiovascular: Positive for leg swelling.       Lower extremity pain  Gastrointestinal: Negative.   Endocrine: Negative.   Genitourinary: Negative.   Musculoskeletal: Negative.   Skin: Positive for color change and wound.  Allergic/Immunologic: Negative.   Neurological: Negative.   Hematological: Negative.   Psychiatric/Behavioral: Negative.       Objective:   Physical Exam  Constitutional: He is oriented to person, place, and time. He appears well-developed and well-nourished.  Patient is very pleasant however he is forgetful at times  HENT:  Head: Normocephalic and atraumatic.  Eyes:  Pupils are equal, round, and reactive to light. Conjunctivae are normal.  Neck: Normal range of motion.  Cardiovascular: Normal rate, regular rhythm, normal heart sounds and intact distal pulses.   Pulses:      Radial pulses are 2+ on the right side, and 2+ on the left side.  Unable to palpate pedal pulses due to body habitus and edema  Pulmonary/Chest: Effort normal.  Musculoskeletal: Normal range of motion. He exhibits edema (moderate bilateral nonpitting edema noted).  Neurological: He is alert and oriented to person, place, and time.  Skin:     Right lower extremity: there are 2 shallow ulcerations noted to the lateral aspect of the right calf. One measuring 2cm x 2cm the other measuring 1cm x 2cm. They are noninfected. There is no drainage. The patient has cellulitis to the bilateral calves. There is moderate to severe stasis dermatitis noted to the bilateral calves. There are no skin changes.  Psychiatric: He has a normal mood and affect. His behavior is normal. Judgment and thought content normal.  Vitals reviewed.  BP (!) 148/74 (BP Location: Right Arm)   Pulse 87   Resp 16   Ht 5\' 11"  (1.803 m)   Wt 242 lb (109.8 kg)   BMI 33.75 kg/m   Past Medical History:  Diagnosis Date  . Anemia    in past  . Anxiety   . Dementia    able to sign own papers  . Depression   . Diabetes mellitus without complication (HCC)   . Full dentures    upper and lower  .  GERD (gastroesophageal reflux disease)   . Head trauma    admitted to [facility], "pint of blood removed from head", no deficits after  . Hypercholesteremia   . Hypertension   . 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 Marital status: Married    Spouse name: N/A  . Number of children: N/A  . Years of education: N/A   Occupational History  . Not on file.   Social History Main Topics  . Smoking status: Former Smoker    Quit date: 04/30/1966  . Smokeless  tobacco: Never Used  . Alcohol use No  . Drug use: Unknown  . Sexual activity: Not on file   Other Topics Concern  . Not on file   Social History Narrative  . No narrative on file   Past Surgical History:  Procedure Laterality Date  . APPENDECTOMY     childhood  . BACK SURGERY  1963  . CATARACT EXTRACTION W/PHACO Left 09/15/2014   Procedure: CATARACT EXTRACTION PHACO AND INTRAOCULAR LENS PLACEMENT (IOC);  Surgeon: Lockie Mola, MD;  Location: [facility];  Service: Ophthalmology;  Laterality: Left;  DIABETIC PT WOULD LIKE TO HAVE ARRIVAL TIME LATE AM  . JOINT REPLACEMENT Left    knee  . PHOTOCOAGULATION WITH LASER Right 01/23/2016   Procedure: PHOTOCOAGULATION WITH LASER;  Surgeon: Sherald Hess, MD;  Location: [facility];  Service: Ophthalmology;  Laterality: Right;  DIABETIC - oral meds RIGHT Requests arrival 10AM or after   No family history on file.  No Known Allergies     Assessment & Plan:  Presents as a new patient self-referred for evaluation of bilateral lower extremity edema. Patient with a long-standing history of edema to the bilateral lower extremities which has progressively worsened over the last few years. Seen with daughter. Patient with some dementia - history is obtained from both the patient and the daughter. The patient experiences worsening edema as the day progresses or with standing for long periods of time. The patient notes a discomfort associated with the edema. His discomfort has also progressed to the point he is unable to function on a daily basis without significant pain. The patient has been prone to recurrent cellulitis. The patient notes ulcer development to the lateral aspect of his right calf. This has been present for a few weeks and is slow to heal. At this time, the patient does not engage in conservative therapy. The patient denies any surgery or trauma to the lower extremity. Patient denies any DVT history.  Patient denies any fever, nausea or vomiting.  1. Bilateral lower extremity edema - New Due to the patient's diagnosis of mild dementia I recommend placing him in bilateral zinc oxide 3 layer Unna boots for approximately one month with weekly changes. Hopefully this will gain control of his edema and subsequently assist in healing with the right calf ulcerations. The patient will continue to be prone to recurrent cellulitis unless the edema to his lower extremity is controlled We had a long discussion about appropriate elevation as being heart level or higher The patient is to follow up in one month to assess his progress with Unna boot therapy.  - VAS [place] LOWER EXTREMITY VENOUS REFLUX; Future - Ambulatory referral to Home Health  2. Lower extremity pain, bilateral - New The patient presents today with a slow to heal ulceration to the right calf. The patient has multiple risk factors  for peripheral artery disease Hard to palpate pedal pulses on exam I will bring the patient back to undergo an bilateral ABI o assess for any contributing peripheral artery disease  - VAS [place] ABI WITH/WO TBI; Future - Ambulatory referral to Home Health  3. Cellulitis of lower extremity, unspecified laterality - New Patient presents today with bilateral cellulitis He denies any fever, nausea or vomiting. Doxycycline 100mg  one tab by mouth daily #10  If patient should experience any fever, nausea vomiting increased redness or pain to the lower extremity he is to seek medical attention  - Ambulatory referral to Home Health  4. Essential hypertension - Stable Encouraged good control as its slows the progression of atherosclerotic disease  5. Hyperlipidemia, unspecified hyperlipidemia type - Stable Encouraged good control as its slows the progression of atherosclerotic disease  6. Type 2 diabetes mellitus with complication, unspecified whether long term insulin use (HCC) - Stable Encouraged good control as  its slows the progression of atherosclerotic disease  Current Outpatient Prescriptions on File Prior to Visit  Medication Sig Dispense Refill  . ALPRAZolam (XANAX) 0.5 MG tablet Take 0.5 mg by mouth 2 (two) times daily. 1/2 tab, 1 PM    . Cholecalciferol (VITAMIN D-3 PO) Take by mouth daily. 10 AM    . furosemide (LASIX) 20 MG tablet Take 20 mg by mouth daily.    Marland Kitchen gabapentin (NEURONTIN) 300 MG capsule Take 300 mg by mouth 2 (two) times daily. 1 PM, 11 PM    . HYDROcodone-acetaminophen (NORCO/VICODIN) 5-325 MG per tablet Take 1 tablet by mouth every 6 (six) hours as needed for moderate pain. 1 PM, 11PM    . metFORMIN (GLUCOPHAGE) 500 MG tablet Take 1,000 mg by mouth 2 (two) times daily with a meal. 1 PM, 11 PM    . nortriptyline (PAMELOR) 25 MG capsule Take 25 mg by mouth at bedtime. 11 PM    . omeprazole (PRILOSEC) 20 MG capsule Take 20 mg by mouth daily. 6 PM    . potassium chloride (K-DUR) 10 MEQ tablet Take 10 mEq by mouth daily.    . sertraline (ZOLOFT) 100 MG tablet Take 100 mg by mouth daily. 6 PM    . simvastatin (ZOCOR) 10 MG tablet Take 10 mg by mouth daily. 10 AM    . sitaGLIPtin (JANUVIA) 100 MG tablet Take 100 mg by mouth daily.    . sucralfate (CARAFATE) 1 G tablet Take 1 g by mouth 2 (two) times daily. 10 AM, 11 PM    . tamsulosin (FLOMAX) 0.4 MG CAPS capsule Take 0.4 mg by mouth. 1 PM    . valsartan (DIOVAN) 40 MG tablet Take 40 mg by mouth daily. 10 AM    . verapamil (COVERA HS) 240 MG (CO) 24 hr tablet Take 240 mg by mouth daily. 6 PM     No current facility-administered medications on file prior to visit.    There are no Patient Instructions on file for this visit. No Follow-up on file.  Sojourner Behringer A Maor Meckel, PA-C

## 2017-03-05 ENCOUNTER — Encounter (INDEPENDENT_AMBULATORY_CARE_PROVIDER_SITE_OTHER): Payer: Self-pay | Admitting: Vascular Surgery

## 2017-04-10 ENCOUNTER — Ambulatory Visit (INDEPENDENT_AMBULATORY_CARE_PROVIDER_SITE_OTHER): Payer: Medicare Other | Admitting: Vascular Surgery

## 2017-04-10 ENCOUNTER — Ambulatory Visit (INDEPENDENT_AMBULATORY_CARE_PROVIDER_SITE_OTHER): Payer: Medicare Other

## 2017-04-10 ENCOUNTER — Encounter (INDEPENDENT_AMBULATORY_CARE_PROVIDER_SITE_OTHER): Payer: Medicare Other

## 2017-04-10 ENCOUNTER — Encounter (INDEPENDENT_AMBULATORY_CARE_PROVIDER_SITE_OTHER): Payer: Self-pay | Admitting: Vascular Surgery

## 2017-04-10 VITALS — BP 171/83 | HR 82 | Resp 16 | Wt 235.0 lb

## 2017-04-10 DIAGNOSIS — R600 Localized edema: Secondary | ICD-10-CM | POA: Diagnosis not present

## 2017-04-10 DIAGNOSIS — M79605 Pain in left leg: Secondary | ICD-10-CM | POA: Diagnosis not present

## 2017-04-10 DIAGNOSIS — L03119 Cellulitis of unspecified part of limb: Secondary | ICD-10-CM | POA: Diagnosis not present

## 2017-04-10 DIAGNOSIS — M79604 Pain in right leg: Secondary | ICD-10-CM | POA: Diagnosis not present

## 2017-04-10 DIAGNOSIS — I890 Lymphedema, not elsewhere classified: Secondary | ICD-10-CM

## 2017-04-10 NOTE — Progress Notes (Signed)
Subjective:    Patient ID: Tim Henry, male    DOB: 03/23/1928, 83 y.o.   MRN: [id number] Chief Complaint  Patient presents with  . Follow-up    1mth abi,bil reflux   Patient presents to review vascular studies.  The patient was last seen on March 01, 2017 and evaluation of bilateral lower extremity pain, swelling and cellulitis.  At the time, the patient was treated with bilateral Unna wraps which were placed by visiting nursing services.  The patient's Unna wraps have been stopped and he has transitioned into compression stockings.  The patient is seen with his daughter.  The patient notes his swelling has improved somewhat however he continues to experience edema and discomfort to the lower extremity.  His discomfort has progressed to the point he is unable to function on a daily basis and his symptoms have become lifestyle limiting.  The patient underwent a bilateral ABI which was notable for no significant lower extremity arterial disease.  The bilateral toe brachial indices are normal.  The patient underwent a bilateral lower extremity venous reflux exam which was notable for incompetence of the bilateral lower extremities.  No evidence of deep or superficial vein thrombosis in the bilateral lower extremity.  The patient denies any recurrent bouts of cellulitis since his last visit.  The patient denies any fever, nausea or vomiting.  The patient denies any recurrent ulceration or weeping since his last visit.   Review of Systems  Constitutional: Negative.   HENT: Negative.   Eyes: Negative.   Respiratory: Negative.   Cardiovascular: Positive for leg swelling.       Lower extremity pain  Gastrointestinal: Negative.   Endocrine: Negative.   Genitourinary: Negative.   Musculoskeletal: Negative.   Skin: Negative.   Allergic/Immunologic: Negative.   Neurological: Negative.   Hematological: Negative.   Psychiatric/Behavioral: Negative.       Objective:   Physical Exam    Constitutional: He is oriented to person, place, and time. He appears well-developed and well-nourished. No distress.  HENT:  Head: Normocephalic and atraumatic.  Eyes: Conjunctivae are normal. Pupils are equal, round, and reactive to light.  Neck: Normal range of motion.  Cardiovascular: Normal rate, regular rhythm, normal heart sounds and intact distal pulses.  Pulses:      Radial pulses are 2+ on the right side, and 2+ on the left side.  Unable to palpate bilateral pedal pulses however his bilateral feet are warm  Pulmonary/Chest: Effort normal and breath sounds normal.  Musculoskeletal: Normal range of motion. He exhibits edema (Moderate 1+ pitting edema noted bilaterally).  Neurological: He is alert and oriented to person, place, and time.  Skin: He is not diaphoretic.  The patient's cellulitis and ulcerations have resolved bilaterally.  There is skin thickening noted bilaterally.  There is moderate stasis dermatitis noted bilaterally  Psychiatric: He has a normal mood and affect. His behavior is normal. Judgment and thought content normal.  Vitals reviewed.  BP (!) 171/83 (BP Location: Left Arm)   Pulse 82   Resp 16   Wt 235 lb (106.6 kg)   BMI 32.78 kg/m   Past Medical History:  Diagnosis Date  . Anemia    in past  . Anxiety   . Dementia    able to sign own papers  . Depression   . Diabetes mellitus without complication (HCC)   . Full dentures    upper and lower  . GERD (gastroesophageal reflux disease)   . Head trauma  [facility], "pint of blood removed from head", no deficits after  . Hypercholesteremia   . Hypertension   . 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 Marital status: Married    Spouse name: Not on file  . Number of children: Not on file  . Years of education: Not on file  . Highest education level: Not on file  Social Needs  . Financial resource strain: Not on file  .  Food insecurity - worry: Not on file  . Food insecurity - inability: Not on file  . Transportation needs - medical: Not on file  . Transportation needs - non-medical: Not on file  Occupational History  . Not on file  Tobacco Use  . Smoking status: Former Smoker    Last attempt to quit: 04/30/1966    Years since quitting: 50.9  . Smokeless tobacco: Never Used  Substance and Sexual Activity  . Alcohol use: No  . Drug use: No  . Sexual activity: Not on file  Other Topics Concern  . Not on file  Social History Narrative  . Not on file   Past Surgical History:  Procedure Laterality Date  . APPENDECTOMY     childhood  . BACK SURGERY  1963  . CATARACT EXTRACTION W/PHACO Left 09/15/2014   Procedure: CATARACT EXTRACTION PHACO AND INTRAOCULAR LENS PLACEMENT (IOC);  Surgeon: Lockie Molahadwick Brasington, MD;  [facility];  Service: Ophthalmology;  Laterality: Left;  DIABETIC PT WOULD LIKE TO HAVE ARRIVAL TIME LATE AM  . JOINT REPLACEMENT Left    knee  . PHOTOCOAGULATION WITH LASER Right 01/23/2016   Procedure: PHOTOCOAGULATION WITH LASER;  Surgeon: Sherald HessAnita Prakash Vin-Parikh, MD;  [facility];  Service: Ophthalmology;  Laterality: Right;  DIABETIC - oral meds RIGHT Requests arrival 10AM or after   Family History  Problem Relation Age of Onset  . Heart attack Mother   . Diabetes Mother   . Heart attack Father   . Diabetes Sister    No Known Allergies     Assessment & Plan:  Patient presents to review vascular studies.  The patient was last seen on March 01, 2017 and evaluation of bilateral lower extremity pain, swelling and cellulitis.  At the time, the patient was treated with bilateral Unna wraps which were placed by visiting nursing services.  The patient's Unna wraps have been stopped and he has transitioned into compression stockings.  The patient is seen with his daughter.  The patient notes his swelling has improved somewhat however he continues to  experience edema and discomfort to the lower extremity.  His discomfort has progressed to the point he is unable to function on a daily basis and his symptoms have become lifestyle limiting.  The patient underwent a bilateral ABI which was notable for no significant lower extremity arterial disease.  The bilateral toe brachial indices are normal.  The patient underwent a bilateral lower extremity venous reflux exam which was notable for incompetence of the bilateral lower extremities.  No evidence of deep or superficial vein thrombosis in the bilateral lower extremity.  The patient denies any recurrent bouts of cellulitis since his last visit.  The patient denies any fever, nausea or vomiting.  The patient denies any recurrent ulceration or weeping since his last visit.  1. Lymphedema - New Despite conservative treatment including exercise, elevation, bilateral Unna boot therapy and class I compression stockings the patient  still presents with stage II lymphedema The patient would greatly benefit from the added therapy of a lymphedema pump The patient is to continue engaging in conservative therapy while applied for his lymphedema pump including wearing medical grade 1 compression stockings, elevation and exercise. The patient is to call our office if he notices a worsening in his bilateral lower extremity edema The daughter and the patient is in agreement  2. Cellulitis of lower extremity, unspecified laterality - Stable The patient's cellulitis diagnosed last visit has resolved The patient has not experienced another bout of cellulitis since his last visit. The patient understands if his lower extremity edema is not controlled he will be at risk for recurrent bouts of cellulitis He can prevent this by engaging in conservative therapy and we will apply for a lymphedema pump The patient and his daughter who was present during this examination are in agreement  3. Bilateral lower extremity edema -  Stable Patient has a past medical history of osteoarthritis and degenerative joint disease. The patient needs a right total knee replacement The patient had a normal ABI and I do not feel his bilateral lower extremity discomfort is solely from arterial or venous disease  Current Outpatient Medications on File Prior to Visit  Medication Sig Dispense Refill  . ALPRAZolam (XANAX) 0.5 MG tablet Take 0.5 mg by mouth 2 (two) times daily. 1/2 tab, 1 PM    . atropine 1 % ophthalmic solution atropine 1 % eye drops    . Cholecalciferol (VITAMIN D-3 PO) Take by mouth daily. 10 AM    . Difluprednate (DUREZOL) 0.05 % EMUL     . dorzolamide-timolol (COSOPT) 22.3-6.8 MG/ML ophthalmic solution dorzolamide 22.3 mg-timolol 6.8 mg/mL eye drops    . ferrous sulfate 325 (65 FE) MG tablet Take 325 mg by mouth daily with breakfast.    . furosemide (LASIX) 20 MG tablet Take 20 mg by mouth daily.    Marland Kitchen gabapentin (NEURONTIN) 300 MG capsule Take 300 mg by mouth 2 (two) times daily. 1 PM, 11 PM    . HYDROcodone-acetaminophen (NORCO/VICODIN) 5-325 MG per tablet Take 1 tablet by mouth every 6 (six) hours as needed for moderate pain. 1 PM, 11PM    . latanoprost (XALATAN) 0.005 % ophthalmic solution 1 drop at bedtime.    . metFORMIN (GLUCOPHAGE) 500 MG tablet Take 1,000 mg by mouth 2 (two) times daily with a meal. 1 PM, 11 PM    . Multiple Vitamin (MULTIVITAMIN) capsule Take 1 capsule by mouth daily.    . nefazodone (SERZONE) 100 MG tablet Take by mouth.    . nortriptyline (PAMELOR) 25 MG capsule Take 25 mg by mouth at bedtime. 11 PM    . omeprazole (PRILOSEC) 20 MG capsule Take 20 mg by mouth daily. 6 PM    . potassium chloride (K-DUR) 10 MEQ tablet Take 10 mEq by mouth daily.    . sertraline (ZOLOFT) 100 MG tablet Take 100 mg by mouth daily. 6 PM    . simvastatin (ZOCOR) 10 MG tablet Take 10 mg by mouth daily. 10 AM    . sitaGLIPtin (JANUVIA) 100 MG tablet Take 100 mg by mouth daily.    . sucralfate (CARAFATE) 1 G  tablet Take 1 g by mouth 2 (two) times daily. 10 AM, 11 PM    . tamsulosin (FLOMAX) 0.4 MG CAPS capsule Take 0.4 mg by mouth. 1 PM    . valsartan (DIOVAN) 40 MG tablet Take 40 mg by mouth daily. 10 AM    .  verapamil (COVERA HS) 240 MG (CO) 24 hr tablet Take 240 mg by mouth daily. 6 PM     No current facility-administered medications on file prior to visit.     There are no Patient Instructions on file for this visit. No Follow-up on file.   KIMBERLY A STEGMAYER, PA-C

## 2017-04-12 ENCOUNTER — Ambulatory Visit: Payer: Medicare Other | Admitting: Urology

## 2017-04-12 DIAGNOSIS — N401 Enlarged prostate with lower urinary tract symptoms: Secondary | ICD-10-CM | POA: Diagnosis not present

## 2017-04-12 MED ORDER — FINASTERIDE 5 MG PO TABS: 5.0000 mg | ORAL_TABLET | Freq: Every day | ORAL | 11 refills | Status: DC

## 2017-04-12 MED ORDER — TAMSULOSIN HCL 0.4 MG PO CAPS: 0.4000 mg | ORAL_CAPSULE | Freq: Every day | ORAL | 11 refills | Status: DC

## 2017-04-12 NOTE — Progress Notes (Signed)
04/12/2017 2:48 PM   Tim Henry [birth year] [id number]  Referring provider: Aniceto BossSettle, Paul C, MD [address] [place], [place] [id number]  Chief Complaint  Patient presents with  . Benign Prostatic Hypertrophy    HPI: 83 year old male presents for follow-up of BPH.  I have seen him since 2015 for lower urinary tract symptoms which significantly improved on tamsulosin.  [facility] and [facility] 2017 for microhematuria.  Renal ultrasound showed bilateral simple cysts.  Cystoscopy remarkable for BPH with trilobar enlargement and prominent hypervascularity.  Since his last visit he has stable lower urinary tract symptoms.  He does have periodic episodes of straining to urinate and urinary hesitancy along with frequency and urgency.  He denies dysuria or gross hematuria.  He has no flank, abdominal, pelvic or scrotal pain.   PMH: Past Medical History:  Diagnosis Date  . Anemia    in past  . Anxiety   . Dementia    able to sign own papers  . Depression   . Diabetes mellitus without complication (HCC)   . Full dentures    upper and lower  . GERD (gastroesophageal reflux disease)   . Head trauma    [facility], "pint of blood removed from head", no deficits after  . Hypercholesteremia   . Hypertension   . 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 APPENDECTOMY     childhood  . BACK SURGERY  1963  . CATARACT EXTRACTION W/PHACO Left 09/15/2014   Procedure: CATARACT EXTRACTION PHACO AND INTRAOCULAR LENS PLACEMENT (IOC);  Surgeon: Lockie Molahadwick Brasington, MD;  [facility];  Service: Ophthalmology;  Laterality: Left;  DIABETIC PT WOULD LIKE TO HAVE ARRIVAL TIME LATE AM  . JOINT REPLACEMENT Left    knee  . PHOTOCOAGULATION WITH LASER Right 01/23/2016   Procedure: PHOTOCOAGULATION WITH LASER;  Surgeon: Sherald HessAnita Prakash Vin-Parikh, MD;  [facility];  Service: Ophthalmology;  Laterality: Right;  DIABETIC - oral meds RIGHT Requests arrival 10AM or after    Home Medications:  Allergies as of 04/12/2017   No Known Allergies     Medication List        Accurate as of 04/12/17  2:48 PM. Always use your most recent med list.          ALPRAZolam 0.5 MG tablet Commonly known as:  XANAX Take 0.5 mg by mouth 2 (two) times daily. 1/2 tab, 1 PM   atropine 1 % ophthalmic solution atropine 1 % eye drops   dorzolamide-timolol 22.3-6.8 MG/ML ophthalmic solution Commonly known as:  COSOPT dorzolamide 22.3 mg-timolol 6.8 mg/mL eye drops   DUREZOL 0.05 % Emul Generic drug:  Difluprednate   ferrous sulfate 325 (65 FE) MG tablet Take 325 mg by mouth daily with breakfast.   furosemide 20 MG tablet Commonly known as:  LASIX Take 20 mg by mouth daily.   gabapentin 300 MG capsule Commonly known as:  NEURONTIN Take 300 mg by mouth 2 (two) times daily. 1 PM, 11 PM   HYDROcodone-acetaminophen 5-325 MG tablet Commonly known as:  NORCO/VICODIN Take 1 tablet by mouth every 6 (six) hours as needed for moderate pain. 1 PM, 11PM   latanoprost 0.005 % ophthalmic solution Commonly known as:  XALATAN 1 drop at bedtime.   metFORMIN 500 MG tablet Commonly known as:  GLUCOPHAGE Take 1,000 mg by mouth 2 (two)  times daily with a meal. 1 PM, 11 PM   multivitamin capsule Take 1 capsule by mouth daily.   nefazodone 100 MG tablet Commonly known as:  SERZONE Take by mouth.   nortriptyline 25 MG capsule Commonly known as:  PAMELOR Take 25 mg by mouth at bedtime. 11 PM   omeprazole 20 MG capsule Commonly known as:  PRILOSEC Take 20 mg by mouth daily. 6 PM   potassium chloride 10 MEQ tablet Commonly known as:  K-DUR Take 10 mEq by mouth daily.   sertraline 100 MG tablet Commonly known as:  ZOLOFT Take 100 mg by mouth daily. 6 PM   simvastatin 10 MG tablet Commonly known as:  ZOCOR Take 10 mg by mouth daily. 10 AM     sitaGLIPtin 100 MG tablet Commonly known as:  JANUVIA Take 100 mg by mouth daily.   sucralfate 1 g tablet Commonly known as:  CARAFATE Take 1 g by mouth 2 (two) times daily. 10 AM, 11 PM   tamsulosin 0.4 MG Caps capsule Commonly known as:  FLOMAX Take 0.4 mg by mouth. 1 PM   valsartan 40 MG tablet Commonly known as:  DIOVAN Take 40 mg by mouth daily. 10 AM   verapamil 240 MG (CO) 24 hr tablet Commonly known as:  COVERA HS Take 240 mg by mouth daily. 6 PM   VITAMIN D-3 PO Take by mouth daily. 10 AM       Allergies: No Known Allergies  Family History: Family History  Problem Relation Age of Onset  . Heart attack Mother   . Diabetes Mother   . Heart attack Father   . Diabetes Sister     Social History:  reports that he quit smoking about 50 years ago. he has never used smokeless tobacco. He reports that he does not drink alcohol or use drugs.  ROS: UROLOGY Frequent Urination?: Yes Hard to postpone urination?: Yes Burning/pain with urination?: No Get up at night to urinate?: Yes Leakage of urine?: No Urine stream starts and stops?: No Trouble starting stream?: Yes Do you have to strain to urinate?: Yes Blood in urine?: No Urinary tract infection?: No Sexually transmitted disease?: No Injury to kidneys or bladder?: No Painful intercourse?: No Weak stream?: Yes Erection problems?: Yes Penile pain?: No  Gastrointestinal Nausea?: No Vomiting?: No Indigestion/heartburn?: Yes Diarrhea?: No Constipation?: No  Constitutional Fever: No Night sweats?: No Weight loss?: No Fatigue?: No  Skin Skin rash/lesions?: Yes Itching?: Yes  Eyes Blurred vision?: No Double vision?: No  Ears/Nose/Throat Sore throat?: No Sinus problems?: No  Hematologic/Lymphatic Swollen glands?: No Easy bruising?: No  Cardiovascular Leg swelling?: Yes Chest pain?: No  Respiratory Cough?: Yes Shortness of breath?: No  Endocrine Excessive thirst?:  No  Musculoskeletal Back pain?: Yes Joint pain?: Yes  Neurological Headaches?: Yes Dizziness?: Yes  Psychologic Depression?: No Anxiety?: Yes  Physical Exam: There were no vitals taken for this visit.  Constitutional:  Alert and oriented, No acute distress. HEENT: [place] AT, moist mucus membranes.  Trachea midline, no masses. Cardiovascular: No clubbing, cyanosis, or edema. Respiratory: Normal respiratory effort, no increased work of breathing. GI: Abdomen is soft, nontender, nondistended, no abdominal masses GU: No CVA tenderness.  Skin: No rashes, bruises or suspicious lesions. Lymph: No cervical or inguinal adenopathy. Neurologic: Grossly intact, no focal deficits, moving all 4 extremities. Psychiatric: Normal mood and affect.  Laboratory Data: Lab Results  Component Value Date   WBC 8.0 07/07/2013   HGB 11.1 (L) 07/07/2013   HCT  34.4 (L) 07/07/2013   MCV 96 07/07/2013   PLT 196 07/07/2013     Assessment & Plan:    1. Benign prostatic hyperplasia with lower urinary tract symptoms, symptom details unspecified Stable lower urinary tract symptoms.  He has periodic increased symptoms of hesitancy, decreased stream and frequency/urgency.  Discussed adding a 5-ARI medication.  He did want to try this medication and was informed it will take at least 6 months to determine efficacy.  His tamsulosin was refilled and Rx finasteride sent to his pharmacy.    Riki AltesScott C Stoioff, MD  [facility] [address], [address] [place], [place] [id number] [phone]

## 2017-04-15 ENCOUNTER — Encounter (INDEPENDENT_AMBULATORY_CARE_PROVIDER_SITE_OTHER): Payer: Medicare Other

## 2017-04-15 ENCOUNTER — Ambulatory Visit (INDEPENDENT_AMBULATORY_CARE_PROVIDER_SITE_OTHER): Payer: Medicare Other | Admitting: Vascular Surgery

## 2017-07-10 ENCOUNTER — Ambulatory Visit (INDEPENDENT_AMBULATORY_CARE_PROVIDER_SITE_OTHER): Payer: Medicare Other | Admitting: Vascular Surgery

## 2017-11-14 ENCOUNTER — Encounter: Payer: Self-pay | Admitting: Urology

## 2017-11-14 ENCOUNTER — Other Ambulatory Visit: Payer: Self-pay | Admitting: Urology

## 2017-11-14 ENCOUNTER — Encounter

## 2017-11-14 ENCOUNTER — Ambulatory Visit: Payer: Medicare Other | Admitting: Urology

## 2017-11-14 VITALS — BP 122/70 | HR 83 | Resp 16 | Ht 71.0 in | Wt 218.6 lb

## 2017-11-14 DIAGNOSIS — R3915 Urgency of urination: Secondary | ICD-10-CM | POA: Diagnosis not present

## 2017-11-14 DIAGNOSIS — N401 Enlarged prostate with lower urinary tract symptoms: Secondary | ICD-10-CM

## 2017-11-14 DIAGNOSIS — R351 Nocturia: Secondary | ICD-10-CM

## 2017-11-14 DIAGNOSIS — R350 Frequency of micturition: Secondary | ICD-10-CM

## 2017-11-14 LAB — MICROSCOPIC EXAMINATION

## 2017-11-14 LAB — URINALYSIS, COMPLETE
BILIRUBIN UA: NEGATIVE
GLUCOSE, UA: NEGATIVE
Ketones, UA: NEGATIVE
Leukocytes, UA: NEGATIVE
Nitrite, UA: NEGATIVE
PH UA: 7 (ref 5.0–7.5)
Protein, UA: NEGATIVE
RBC UA: NEGATIVE
Specific Gravity, UA: 1.02 (ref 1.005–1.030)
UUROB: 0.2 mg/dL (ref 0.2–1.0)

## 2017-11-14 NOTE — Progress Notes (Signed)
11/14/2017 1:39 PM   Tim Henry 1927/06/03 [id number]  Referring provider: Aniceto Boss, MD [address] [place], [place] [address]  Chief Complaint  Patient presents with  . Urinary Incontinence    HPI: 84 year old male with a long history of BPH and lower urinary tract symptoms.  Refer to my prior note of 04/12/2017 for a clinical summary.  He had been on tamsulosin for several years and finasteride was added at that visit however he presents today with his daughter and she is not sure he is taking that medication.  Over the past few months he has had increased urinary frequency, urgency and nocturia x2-3.  He will have wetting accidents at nighttime.  He denies dysuria or gross hematuria.   PMH: Past Medical History:  Diagnosis Date  . Anemia    in past  . Anxiety   . Dementia    able to sign own papers  . Depression   . Diabetes mellitus without complication (HCC)   . Full dentures    upper and lower  . GERD (gastroesophageal reflux disease)   . Head trauma    admitted to [facility], "pint of blood removed from head", no deficits after  . Hypercholesteremia   . Hypertension   . 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 APPENDECTOMY     childhood  . BACK SURGERY  1963  . CATARACT EXTRACTION W/PHACO Left 09/15/2014   Procedure: CATARACT EXTRACTION PHACO AND INTRAOCULAR LENS PLACEMENT (IOC);  Surgeon: Lockie Mola, MD;  [facility];  Service: Ophthalmology;  Laterality: Left;  DIABETIC PT WOULD LIKE TO HAVE ARRIVAL TIME LATE AM  . JOINT REPLACEMENT Left    knee  . PHOTOCOAGULATION WITH LASER Right 01/23/2016   Procedure: PHOTOCOAGULATION WITH LASER;  Surgeon: Sherald Hess, MD;  [facility];  Service: Ophthalmology;  Laterality: Right;  DIABETIC - oral meds RIGHT Requests arrival 10AM or after    Home Medications:    Allergies as of 11/14/2017   No Known Allergies     Medication List        Accurate as of 11/14/17  1:39 PM. Always use your most recent med list.          ALPRAZolam 0.5 MG tablet Commonly known as:  XANAX Take 0.5 mg by mouth 2 (two) times daily. 1/2 tab, 1 PM   atropine 1 % ophthalmic solution atropine 1 % eye drops   dorzolamide-timolol 22.3-6.8 MG/ML ophthalmic solution Commonly known as:  COSOPT dorzolamide 22.3 mg-timolol 6.8 mg/mL eye drops   DUREZOL 0.05 % Emul Generic drug:  Difluprednate   ferrous sulfate 325 (65 FE) MG tablet Take 325 mg by mouth daily with breakfast.   finasteride 5 MG tablet Commonly known as:  PROSCAR Take 1 tablet (5 mg total) by mouth daily.   furosemide 20 MG tablet Commonly known as:  LASIX Take 20 mg by mouth daily.   gabapentin 300 MG capsule Commonly known as:  NEURONTIN Take 300 mg by mouth 2 (two) times daily. 1 PM, 11 PM   HYDROcodone-acetaminophen 5-325 MG tablet Commonly known as:  NORCO/VICODIN Take 1 tablet by mouth every 6 (six) hours as needed for moderate pain. 1 PM, 11PM   latanoprost 0.005 % ophthalmic solution Commonly known as:  XALATAN 1 drop at bedtime.   metFORMIN 500 MG tablet Commonly known as:  GLUCOPHAGE Take 1,000 mg by mouth 2 (two) times daily with a meal. 1 PM, 11 PM   multivitamin capsule Take 1 capsule by mouth daily.   nefazodone 100 MG tablet Commonly known as:  SERZONE Take by mouth.   nortriptyline 25 MG capsule Commonly known as:  PAMELOR Take 25 mg by mouth at bedtime. 11 PM   omeprazole 20 MG capsule Commonly known as:  PRILOSEC Take 20 mg by mouth daily. 6 PM   potassium chloride 10 MEQ tablet Commonly known as:  K-DUR Take 10 mEq by mouth daily.   sertraline 100 MG tablet Commonly known as:  ZOLOFT Take 100 mg by mouth daily. 6 PM   simvastatin 10 MG tablet Commonly known as:  ZOCOR Take 10 mg by mouth daily. 10 AM   sitaGLIPtin 100 MG tablet Commonly known as:   JANUVIA Take 100 mg by mouth daily.   sucralfate 1 g tablet Commonly known as:  CARAFATE Take 1 g by mouth 2 (two) times daily. 10 AM, 11 PM   tamsulosin 0.4 MG Caps capsule Commonly known as:  FLOMAX Take 1 capsule (0.4 mg total) by mouth daily after breakfast. 1 PM   valsartan 40 MG tablet Commonly known as:  DIOVAN Take 40 mg by mouth daily. 10 AM   verapamil 240 MG (CO) 24 hr tablet Commonly known as:  COVERA HS Take 240 mg by mouth daily. 6 PM   VITAMIN D-3 PO Take by mouth daily. 10 AM       Allergies: No Known Allergies  Family History: Family History  Problem Relation Age of Onset  . Heart attack Mother   . Diabetes Mother   . Heart attack Father   . Diabetes Sister     Social History:  reports that he quit smoking about 51 years ago. He has never used smokeless tobacco. He reports that he does not drink alcohol or use drugs.  ROS: UROLOGY Frequent Urination?: Yes Hard to postpone urination?: No Burning/pain with urination?: No Get up at night to urinate?: Yes Leakage of urine?: No Urine stream starts and stops?: No Trouble starting stream?: No Do you have to strain to urinate?: No Blood in urine?: No Urinary tract infection?: No Sexually transmitted disease?: No Injury to kidneys or bladder?: No Painful intercourse?: No Weak stream?: No Erection problems?: No Penile pain?: No  Gastrointestinal Nausea?: No Vomiting?: No Indigestion/heartburn?: No Diarrhea?: Yes Constipation?: No  Constitutional Fever: No Night sweats?: No Weight loss?: No Fatigue?: No  Skin Skin rash/lesions?: No Itching?: No  Eyes Blurred vision?: Yes Double vision?: No  Ears/Nose/Throat Sore throat?: No Sinus problems?: No  Hematologic/Lymphatic Swollen glands?: No Easy bruising?: No  Cardiovascular Leg swelling?: Yes Chest pain?: No  Respiratory Cough?: Yes Shortness of breath?: No  Endocrine Excessive thirst?: No  Musculoskeletal Back pain?:  Yes Joint pain?: No  Neurological Headaches?: Yes Dizziness?: No  Psychologic Depression?: No Anxiety?: Yes  Physical Exam: BP 122/70   Pulse 83   Resp 16   Ht 5\' 11"  (1.803 m)   Wt 218 lb 9.6 oz (99.2 kg)   SpO2 96%   BMI 30.49 kg/m   Constitutional:  Alert and oriented, No acute distress. HEENT: [place] AT, moist mucus membranes.  Trachea midline, no masses. Cardiovascular: No clubbing, cyanosis, or edema. Respiratory: Normal respiratory effort, no increased work of breathing. GI: Abdomen is soft, nontender, nondistended, no abdominal masses GU: No CVA tenderness Lymph: No cervical or inguinal lymphadenopathy. Skin: No rashes, bruises or  suspicious lesions. Neurologic: Grossly intact, no focal deficits, moving all 4 extremities. Psychiatric: Normal mood and affect.  Laboratory Data:  Urinalysis Dipstick/microscopy negative  Assessment & Plan:   84 year old male with lower urinary tract symptoms.  His daughter will check to see if he is taking finasteride.  He is most bothersome symptoms at present are storage related.  Will give a trial of Myrbetriq 25 mg daily.  He will call back regarding efficacy.  Follow-up 6 months.   Riki Altes, MD  [facility] [address], [address] [place], [place] [address] [phone]

## 2017-11-15 ENCOUNTER — Encounter: Payer: Self-pay | Admitting: Urology

## 2017-11-15 MED ORDER — MIRABEGRON ER 25 MG PO TB24: 25.0000 mg | ORAL_TABLET | Freq: Every day | ORAL | 0 refills | Status: DC

## 2017-12-13 ENCOUNTER — Encounter (INDEPENDENT_AMBULATORY_CARE_PROVIDER_SITE_OTHER): Payer: Self-pay | Admitting: Nurse Practitioner

## 2017-12-13 ENCOUNTER — Ambulatory Visit (INDEPENDENT_AMBULATORY_CARE_PROVIDER_SITE_OTHER): Payer: Medicare Other | Admitting: Nurse Practitioner

## 2017-12-13 VITALS — BP 134/73 | HR 88 | Resp 14 | Ht 70.0 in | Wt 218.0 lb

## 2017-12-13 DIAGNOSIS — E118 Type 2 diabetes mellitus with unspecified complications: Secondary | ICD-10-CM | POA: Diagnosis not present

## 2017-12-13 DIAGNOSIS — L97921 Non-pressure chronic ulcer of unspecified part of left lower leg limited to breakdown of skin: Secondary | ICD-10-CM

## 2017-12-13 DIAGNOSIS — I890 Lymphedema, not elsewhere classified: Secondary | ICD-10-CM

## 2017-12-13 DIAGNOSIS — L97911 Non-pressure chronic ulcer of unspecified part of right lower leg limited to breakdown of skin: Secondary | ICD-10-CM

## 2017-12-13 DIAGNOSIS — I10 Essential (primary) hypertension: Secondary | ICD-10-CM | POA: Diagnosis not present

## 2017-12-15 ENCOUNTER — Encounter (INDEPENDENT_AMBULATORY_CARE_PROVIDER_SITE_OTHER): Payer: Self-pay | Admitting: Nurse Practitioner

## 2017-12-15 DIAGNOSIS — L97921 Non-pressure chronic ulcer of unspecified part of left lower leg limited to breakdown of skin: Secondary | ICD-10-CM

## 2017-12-15 DIAGNOSIS — L97911 Non-pressure chronic ulcer of unspecified part of right lower leg limited to breakdown of skin: Secondary | ICD-10-CM | POA: Insufficient documentation

## 2017-12-15 NOTE — Progress Notes (Signed)
Subjective:    Patient ID: Tim Henry, male    DOB: 1927/05/03, 84 y.o.   MRN: [id number] Chief Complaint  Patient presents with  . Follow-up    Sores on both legs    HPI  Tim Henry is an 84 year old man who presents today for bilateral lower extremity swelling as well as multiple blisters on his legs.  In some areas there are weeping of the blisters.  Patient's daughter states that this happened within the last few weeks and that they have  Tried to use new skin bandages on his wounds.  The patient has a lymphedema pump, which he endorses using however he is not using compression socks when out of the lymphedema pump.  The swelling is better in the morning when the patient first wakes up and it begins to get progressively worse as the day goes on and the patient is in dependent positions, as well as the weeping from the wound continues to get worse as well.  Constitutional: [] Weight loss  [] Fever  [] Chills Cardiac: [] Chest pain   [] Chest pressure   [] Palpitations   [] Shortness of breath when laying flat   [] Shortness of breath with exertion. Vascular:  [] Pain in legs with walking   [] Pain in legs with standing  [] History of DVT   [] Phlebitis   [x] Swelling in legs   [] Varicose veins   [x] Non-healing ulcers Pulmonary:   [] Uses home oxygen   [] Productive cough   [] Hemoptysis   [] Wheeze  [] COPD   [] Asthma Neurologic:  [] Dizziness   [] Seizures   [] History of stroke   [] History of TIA  [] Aphasia   [] Vissual changes   [] Weakness or numbness in arm   [] Weakness or numbness in leg Musculoskeletal:   [] Joint swelling   [] Joint pain   [] Low back pain Hematologic:  [] Easy bruising  [] Easy bleeding   [] Hypercoagulable state   [] Anemic Gastrointestinal:  [] Diarrhea   [] Vomiting  [] Gastroesophageal reflux/heartburn   [] Difficulty swallowing. Genitourinary:  [] Chronic kidney disease   [] Difficult urination  [] Frequent urination   [] Blood in urine Skin:  [] Rashes   [x] Ulcers  Psychological:   [] History of anxiety   []  History of major depression.     Objective:   Physical Exam  BP 134/73 (BP Location: Right Arm, Patient Position: Sitting)   Pulse 88   Resp 14   Ht 5\' 10"  (1.778 m)   Wt 218 lb (98.9 kg)   BMI 31.28 kg/m   Past Medical History:  Diagnosis Date  . Anemia    in past  . Anxiety   . Dementia    able to sign own papers  . Depression   . Diabetes mellitus without complication (HCC)   . Full dentures    upper and lower  . GERD (gastroesophageal reflux disease)   . Head trauma    admitted to [facility], "pint of blood removed from head", no deficits after  . Hypercholesteremia   . Hypertension   . 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)    none since release from hosp     Gen: WD/WN, NAD Head: [place]/AT, No temporalis wasting.  Ear/Nose/Throat: Hearing grossly intact, nares w/o erythema or drainage Eyes: PER, EOMI, sclera nonicteric.  Neck: Supple, no masses.  No bruit or JVD.  Pulmonary:  Good air movement, clear to auscultation bilaterally, no use of accessory muscles.  Cardiac: RRR Vascular:  Multiple blisters with small ulcerations on bilateral lower extremities.  2+ pitting edema bilaterally Vessel Right Left  PT palpable palpable  Gastrointestinal: soft, non-distended. No guarding/no peritoneal signs.  Musculoskeletal: M/S 5/5 throughout.  No deformity or atrophy.  Neurologic: Pain and light touch intact in extremities.  Symmetrical.  Speech is fluent. Motor exam as listed above. Psychiatric: Judgment intact, Mood & affect appropriate for pt's clinical situation. Dermatologic: No Venous rashes but ulcers noted.  No changes consistent with cellulitis. Lymph : No Cervical lymphadenopathy, lichenification and skin changes of chronic lymphedema.   Social History   Socioeconomic History  . Marital status: Married    Spouse name: Not on file  . Number of children: Not on file  . Years of education: Not on file  . Highest education level: Not on file    Occupational History  . Not on file  Social Needs  . Financial resource strain: Not on file  . Food insecurity:    Worry: Not on file    Inability: Not on file  . Transportation needs:    Medical: Not on file    Non-medical: Not on file  Tobacco Use  . Smoking status: Former Smoker    Last attempt to quit: 04/30/1966    Years since quitting: 51.6  . Smokeless tobacco: Never Used  Substance and Sexual Activity  . Alcohol use: No  . Drug use: No  . Sexual activity: Not on file  Lifestyle  . Physical activity:    Days per week: Not on file    Minutes per session: Not on file  . Stress: Not on file  Relationships  . Social connections:    Talks on phone: Not on file    Gets together: Not on file    Attends religious service: Not on file    Active member of club or organization: Not on file    Attends meetings of clubs or organizations: Not on file    Relationship status: Not on file  . Intimate partner violence:    Fear of current or ex partner: Not on file    Emotionally abused: Not on file    Physically abused: Not on file    Forced sexual activity: Not on file  Other Topics Concern  . Not on file  Social History Narrative  . Not on file    Past Surgical History:  Procedure Laterality Date  . APPENDECTOMY     childhood  . BACK SURGERY  1963  . CATARACT EXTRACTION W/PHACO Left 09/15/2014   Procedure: CATARACT EXTRACTION PHACO AND INTRAOCULAR LENS PLACEMENT (IOC);  Surgeon: Lockie Molahadwick Brasington, MD;  [facility];  Service: Ophthalmology;  Laterality: Left;  DIABETIC PT WOULD LIKE TO HAVE ARRIVAL TIME LATE AM  . JOINT REPLACEMENT Left    knee  . PHOTOCOAGULATION WITH LASER Right 01/23/2016   Procedure: PHOTOCOAGULATION WITH LASER;  Surgeon: Sherald HessAnita Prakash Vin-Parikh, MD;  [facility];  Service: Ophthalmology;  Laterality: Right;  DIABETIC - oral meds RIGHT Requests arrival 10AM or after    Family History  Problem Relation Age of  Onset  . Heart attack Mother   . Diabetes Mother   . Heart attack Father   . Diabetes Sister     No Known Allergies     Assessment & Plan:  1. Lymphedema  No surgery or intervention at this point in time.    I have had a long discussion with the patient regarding venous insufficiency and why it  causes symptoms, specifically venous ulceration . I have discussed with the patient the chronic skin changes that accompany venous insufficiency and  the long term sequela such as infection and recurring  ulceration.  Patient will be placed in [employer] which will be changed weekly drainage permitting.  Tim Henry and his daughter have already arranged for home health to come and change his Unna boots weekly.  They will follow-up with [place] in 4 weeks to assess whether or not he can transition to medical grade 1 compression stockings.  In addition, behavioral modification including several periods of elevation of the lower extremities during the day will be continued. Achieving a position with the ankles at heart level was stressed to the patient  The patient is instructed to begin routine exercise, especially walking on a daily basis.  The use of the lymphedema pump is also stressed.  The patient should use it at least 1 to 2 hours daily, while in [employer].  Once the patient transitions to medical grade 1 compression stockings he should continue to wear those stockings as well as use the lymphedema pump.  He was also stressed that he should not sleep in the compression stockings.     2. Ulcers of both lower extremities, limited to breakdown of skin (HCC) See above  3. Essential hypertension Continue antihypertensive medications as already ordered, these medications have been reviewed and there are no changes at this time.   4. Type 2 diabetes mellitus with complication, unspecified whether long term insulin use (HCC) Continue hypoglycemic medications as already ordered, these medications have  been reviewed and there are no changes at this time.  Hgb A1C to be monitored as already arranged by primary service    Current Outpatient Medications on File Prior to Visit  Medication Sig Dispense Refill  . ALPRAZolam (XANAX) 0.5 MG tablet Take 0.5 mg by mouth 2 (two) times daily. 1/2 tab, 1 PM    . atropine 1 % ophthalmic solution atropine 1 % eye drops    . carvedilol (COREG) 6.25 MG tablet   10  . Cholecalciferol (VITAMIN D-3 PO) Take by mouth daily. 10 AM    . Difluprednate (DUREZOL) 0.05 % EMUL     . dorzolamide-timolol (COSOPT) 22.3-6.8 MG/ML ophthalmic solution dorzolamide 22.3 mg-timolol 6.8 mg/mL eye drops    . ferrous sulfate 325 (65 FE) MG tablet Take 325 mg by mouth daily with breakfast.    . finasteride (PROSCAR) 5 MG tablet Take 1 tablet (5 mg total) by mouth daily. 30 tablet 11  . furosemide (LASIX) 20 MG tablet Take 20 mg by mouth daily.    Marland Kitchen gabapentin (NEURONTIN) 300 MG capsule Take 300 mg by mouth 2 (two) times daily. 1 PM, 11 PM    . HYDROcodone-acetaminophen (NORCO/VICODIN) 5-325 MG per tablet Take 1 tablet by mouth every 6 (six) hours as needed for moderate pain. 1 PM, 11PM    . latanoprost (XALATAN) 0.005 % ophthalmic solution 1 drop at bedtime.    . metFORMIN (GLUCOPHAGE) 500 MG tablet Take 1,000 mg by mouth 2 (two) times daily with a meal. 1 PM, 11 PM    . mirabegron ER (MYRBETRIQ) 25 MG TB24 tablet Take 1 tablet (25 mg total) by mouth daily for 28 days. 28 tablet 0  . Multiple Vitamin (MULTIVITAMIN) capsule Take 1 capsule by mouth daily.    . nefazodone (SERZONE) 100 MG tablet Take by mouth.    . nortriptyline (PAMELOR) 25 MG capsule Take 25 mg by mouth at bedtime. 11 PM    . omeprazole (PRILOSEC) 20 MG capsule Take 20 mg by mouth daily. 6 PM    .  potassium chloride (K-DUR) 10 MEQ tablet Take 10 mEq by mouth daily.    . sertraline (ZOLOFT) 100 MG tablet Take 100 mg by mouth daily. 6 PM    . simvastatin (ZOCOR) 10 MG tablet Take 10 mg by mouth daily. 10 AM    .  sitaGLIPtin (JANUVIA) 100 MG tablet Take 100 mg by mouth daily.    . sucralfate (CARAFATE) 1 G tablet Take 1 g by mouth 2 (two) times daily. 10 AM, 11 PM    . tamsulosin (FLOMAX) 0.4 MG CAPS capsule Take 1 capsule (0.4 mg total) by mouth daily after breakfast. 1 PM 30 capsule 11  . telmisartan (MICARDIS) 40 MG tablet   5  . triamcinolone ointment (KENALOG) 0.1 % triamcinolone acetonide 0.1 % topical ointment    . valsartan (DIOVAN) 40 MG tablet Take 40 mg by mouth daily. 10 AM    . verapamil (COVERA HS) 240 MG (CO) 24 hr tablet Take 240 mg by mouth daily. 6 PM     No current facility-administered medications on file prior to visit.     There are no Patient Instructions on file for this visit. No follow-ups on file.   Georgiana SpinnerFallon E Wretha Laris, NP

## 2017-12-24 ENCOUNTER — Telehealth (INDEPENDENT_AMBULATORY_CARE_PROVIDER_SITE_OTHER): Payer: Self-pay | Admitting: Vascular Surgery

## 2017-12-24 NOTE — Telephone Encounter (Signed)
I left a message for Tim Henry to return my call pertaining to the pt order

## 2018-01-10 ENCOUNTER — Ambulatory Visit (INDEPENDENT_AMBULATORY_CARE_PROVIDER_SITE_OTHER): Payer: Medicare Other | Admitting: Vascular Surgery

## 2018-01-10 ENCOUNTER — Encounter (INDEPENDENT_AMBULATORY_CARE_PROVIDER_SITE_OTHER): Payer: Self-pay | Admitting: Vascular Surgery

## 2018-01-10 VITALS — BP 126/72 | HR 99 | Resp 15 | Ht 70.0 in | Wt 222.0 lb

## 2018-01-10 DIAGNOSIS — E118 Type 2 diabetes mellitus with unspecified complications: Secondary | ICD-10-CM | POA: Diagnosis not present

## 2018-01-10 DIAGNOSIS — I10 Essential (primary) hypertension: Secondary | ICD-10-CM

## 2018-01-10 DIAGNOSIS — E785 Hyperlipidemia, unspecified: Secondary | ICD-10-CM | POA: Diagnosis not present

## 2018-01-10 DIAGNOSIS — I890 Lymphedema, not elsewhere classified: Secondary | ICD-10-CM | POA: Diagnosis not present

## 2018-01-10 NOTE — Assessment & Plan Note (Signed)
blood pressure control important in reducing the progression of atherosclerotic disease. On appropriate oral medications.  

## 2018-01-10 NOTE — Progress Notes (Signed)
MRN : [id number]  Tim Henry is a 84 y.o. ([birth year]) male who presents with chief complaint of  Chief Complaint  Patient presents with  . Follow-up    4 week Unna boot check  .  History of Present Illness: Patient returns today in follow up of leg swelling.  His leg swelling is better but he still has persistent ulcerations bilaterally.  It looks like the home health nurse had only a single layer of zinc oxide on this week and that was a bit dry.  His legs are not hurting.  No fevers or chills.  Current Outpatient Medications  Medication Sig Dispense Refill  . ALPRAZolam (XANAX) 0.5 MG tablet Take 0.5 mg by mouth 2 (two) times daily. 1/2 tab, 1 PM    . atropine 1 % ophthalmic solution atropine 1 % eye drops    . carvedilol (COREG) 6.25 MG tablet   10  . Cholecalciferol (VITAMIN D-3 PO) Take by mouth daily. 10 AM    . Difluprednate (DUREZOL) 0.05 % EMUL     . dorzolamide-timolol (COSOPT) 22.3-6.8 MG/ML ophthalmic solution dorzolamide 22.3 mg-timolol 6.8 mg/mL eye drops    . ferrous sulfate 325 (65 FE) MG tablet Take 325 mg by mouth daily with breakfast.    . finasteride (PROSCAR) 5 MG tablet Take 1 tablet (5 mg total) by mouth daily. 30 tablet 11  . furosemide (LASIX) 20 MG tablet Take 20 mg by mouth daily.    Marland Kitchen. gabapentin (NEURONTIN) 300 MG capsule Take 300 mg by mouth 2 (two) times daily. 1 PM, 11 PM    . HYDROcodone-acetaminophen (NORCO/VICODIN) 5-325 MG per tablet Take 1 tablet by mouth every 6 (six) hours as needed for moderate pain. 1 PM, 11PM    . latanoprost (XALATAN) 0.005 % ophthalmic solution 1 drop at bedtime.    . metFORMIN (GLUCOPHAGE) 500 MG tablet Take 1,000 mg by mouth 2 (two) times daily with a meal. 1 PM, 11 PM    . mirabegron ER (MYRBETRIQ) 25 MG TB24 tablet Take 1 tablet (25 mg total) by mouth daily for 28 days. 28 tablet 0  . Multiple Vitamin (MULTIVITAMIN) capsule Take 1 capsule by mouth daily.    . nefazodone (SERZONE) 100 MG tablet Take by mouth.    .  nortriptyline (PAMELOR) 25 MG capsule Take 25 mg by mouth at bedtime. 11 PM    . omeprazole (PRILOSEC) 20 MG capsule Take 20 mg by mouth daily. 6 PM    . potassium chloride (K-DUR) 10 MEQ tablet Take 10 mEq by mouth daily.    . sertraline (ZOLOFT) 100 MG tablet Take 100 mg by mouth daily. 6 PM    . simvastatin (ZOCOR) 10 MG tablet Take 10 mg by mouth daily. 10 AM    . sitaGLIPtin (JANUVIA) 100 MG tablet Take 100 mg by mouth daily.    . sucralfate (CARAFATE) 1 G tablet Take 1 g by mouth 2 (two) times daily. 10 AM, 11 PM    . tamsulosin (FLOMAX) 0.4 MG CAPS capsule Take 1 capsule (0.4 mg total) by mouth daily after breakfast. 1 PM 30 capsule 11  . telmisartan (MICARDIS) 40 MG tablet   5  . triamcinolone ointment (KENALOG) 0.1 % triamcinolone acetonide 0.1 % topical ointment    . valsartan (DIOVAN) 40 MG tablet Take 40 mg by mouth daily. 10 AM    . verapamil (COVERA HS) 240 MG (CO) 24 hr tablet Take 240 mg by mouth daily. 6 PM  No current facility-administered medications for this visit.     Past Medical History:  Diagnosis Date  . Anemia    in past  . Anxiety   . Dementia    able to sign own papers  . Depression   . Diabetes mellitus without complication (HCC)   . Full dentures    upper and lower  . GERD (gastroesophageal reflux disease)   . Head trauma    admitted to [facility], "pint of blood removed from head", no deficits after  . Hypercholesteremia   . Hypertension   . 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 APPENDECTOMY     childhood  . BACK SURGERY  1963  . CATARACT EXTRACTION W/PHACO Left 09/15/2014   Procedure: CATARACT EXTRACTION PHACO AND INTRAOCULAR LENS PLACEMENT (IOC);  Surgeon: Lockie Mola, MD;  Location: [facility];  Service: Ophthalmology;  Laterality: Left;  DIABETIC PT WOULD LIKE TO HAVE ARRIVAL TIME LATE AM  . JOINT REPLACEMENT Left    knee  . PHOTOCOAGULATION  WITH LASER Right 01/23/2016   Procedure: PHOTOCOAGULATION WITH LASER;  Surgeon: Sherald Hess, MD;  [facility];  Service: Ophthalmology;  Laterality: Right;  DIABETIC - oral meds RIGHT Requests arrival 10AM or after    Social History Social History   Tobacco Use  . Smoking status: Former Smoker    Last attempt to quit: 04/30/1966    Years since quitting: 51.7  . Smokeless tobacco: Never Used  Substance Use Topics  . Alcohol use: No  . Drug use: No     Family History Family History  Problem Relation Age of Onset  . Heart attack Mother   . Diabetes Mother   . Heart attack Father   . Diabetes Sister      No Known Allergies   REVIEW OF SYSTEMS (Negative unless checked)  Constitutional: [] Weight loss  [] Fever  [] Chills Cardiac: [] Chest pain   [] Chest pressure   [] Palpitations   [] Shortness of breath when laying flat   [] Shortness of breath at rest   [] Shortness of breath with exertion. Vascular:  [] Pain in legs with walking   [] Pain in legs at rest   [] Pain in legs when laying flat   [] Claudication   [] Pain in feet when walking  [] Pain in feet at rest  [] Pain in feet when laying flat   [] History of DVT   [] Phlebitis   [x] Swelling in legs   [] Varicose veins   [x] Non-healing ulcers Pulmonary:   [] Uses home oxygen   [] Productive cough   [] Hemoptysis   [] Wheeze  [] COPD   [] Asthma Neurologic:  [] Dizziness  [] Blackouts   [] Seizures   [] History of stroke   [] History of TIA  [] Aphasia   [] Temporary blindness   [] Dysphagia   [] Weakness or numbness in arms   [] Weakness or numbness in legs Musculoskeletal:  [x] Arthritis   [] Joint swelling   [] Joint pain   [] Low back pain Hematologic:  [] Easy bruising  [] Easy bleeding   [] Hypercoagulable state   [] Anemic   Gastrointestinal:  [] Blood in stool   [] Vomiting blood  [] Gastroesophageal reflux/heartburn   [] Abdominal pain Genitourinary:  [x] Chronic kidney disease   [] Difficult urination  [] Frequent urination  [] Burning  with urination   [] Hematuria Skin:  [] Rashes   [x] Ulcers   [x] Wounds Psychological:  [] History of anxiety   []  History of major depression.  Physical Examination  BP 126/72 (BP Location: Right  Arm, Patient Position: Sitting)   Pulse 99   Resp 15   Ht 5\' 10"  (1.778 m)   Wt 222 lb (100.7 kg)   BMI 31.85 kg/m  Gen:  WD/WN, NAD.  Appears younger than stated age Head: [place]/AT, No temporalis wasting. Ear/Nose/Throat: Hearing grossly intact, nares w/o erythema or drainage Eyes: Conjunctiva clear. Sclera non-icteric Neck: Supple.  Trachea midline Pulmonary:  Good air movement, no use of accessory muscles.  Cardiac: RRR, no JVD Vascular:  Vessel Right Left  Radial Palpable Palpable                          PT  1+ palpable  1+ palpable  DP Palpable  1+ palpable    Musculoskeletal: M/S 5/5 throughout.  No deformity or atrophy.  Mild bilateral lower extremity edema. Neurologic: Sensation grossly intact in extremities.  Symmetrical.  Speech is fluent.  Psychiatric: Judgment intact, Mood & affect appropriate for pt's clinical situation. Dermatologic: Several small superficial ulcerations are present in the calves bilaterally.       Labs Recent Results (from the past 2160 hour(s))  Urinalysis, Complete     Status: None   Collection Time: 11/14/17 12:00 AM  Result Value Ref Range   Specific Gravity, UA 1.020 1.005 - 1.030   pH, UA 7.0 5.0 - 7.5   Color, UA Yellow Yellow   Appearance Ur Clear Clear   Leukocytes, UA Negative Negative   Protein, UA Negative Negative/Trace   Glucose, UA Negative Negative   Ketones, UA Negative Negative   RBC, UA Negative Negative   Bilirubin, UA Negative Negative   Urobilinogen, Ur 0.2 0.2 - 1.0 mg/dL   Nitrite, UA Negative Negative   Microscopic Examination See below:   Microscopic Examination     Status: Abnormal   Collection Time: 11/14/17 12:00 AM  Result Value Ref Range   WBC, UA 0-5 0 - 5 /hpf   RBC, UA 0-2 0 - 2 /hpf   Epithelial  Cells (non renal) 0-10 0 - 10 /hpf   Renal Epithel, UA 0-10 None seen /hpf   Bacteria, UA Few (A) None seen/Few    Radiology No results found.  Assessment/Plan  Essential hypertension blood pressure control important in reducing the progression of atherosclerotic disease. On appropriate oral medications.   Type 2 diabetes mellitus with complication (HCC) blood glucose control important in reducing the progression of atherosclerotic disease. Also, involved in wound healing. On appropriate medications.   Hyperlipidemia lipid control important in reducing the progression of atherosclerotic disease. Continue statin therapy   Lymphedema Continue weekly Unna boots until the skin is healed.  The swelling is under better control.  These are being done by home health.  3 layer Unna boot was placed bilaterally today.  I will see him back in about a month.    Festus Barren, MD  01/10/2018 3:54 PM    This note was created with Dragon medical transcription system.  Any errors from dictation are purely unintentional

## 2018-01-10 NOTE — Assessment & Plan Note (Signed)
blood glucose control important in reducing the progression of atherosclerotic disease. Also, involved in wound healing. On appropriate medications.  

## 2018-01-10 NOTE — Assessment & Plan Note (Signed)
Continue weekly Unna boots until the skin is healed.  The swelling is under better control.  These are being done by home health.  3 layer Unna boot was placed bilaterally today.  I will see him back in about a month.

## 2018-01-10 NOTE — Assessment & Plan Note (Signed)
lipid control important in reducing the progression of atherosclerotic disease. Continue statin therapy  

## 2018-02-07 ENCOUNTER — Ambulatory Visit (INDEPENDENT_AMBULATORY_CARE_PROVIDER_SITE_OTHER): Payer: Medicare Other | Admitting: Vascular Surgery

## 2018-02-07 ENCOUNTER — Encounter (INDEPENDENT_AMBULATORY_CARE_PROVIDER_SITE_OTHER): Payer: Self-pay | Admitting: Vascular Surgery

## 2018-02-07 VITALS — BP 139/72 | HR 87 | Resp 17 | Ht 72.0 in | Wt 224.0 lb

## 2018-02-07 DIAGNOSIS — I10 Essential (primary) hypertension: Secondary | ICD-10-CM

## 2018-02-07 DIAGNOSIS — E785 Hyperlipidemia, unspecified: Secondary | ICD-10-CM | POA: Diagnosis not present

## 2018-02-07 DIAGNOSIS — L97911 Non-pressure chronic ulcer of unspecified part of right lower leg limited to breakdown of skin: Secondary | ICD-10-CM

## 2018-02-07 DIAGNOSIS — I890 Lymphedema, not elsewhere classified: Secondary | ICD-10-CM | POA: Diagnosis not present

## 2018-02-07 DIAGNOSIS — L97921 Non-pressure chronic ulcer of unspecified part of left lower leg limited to breakdown of skin: Secondary | ICD-10-CM

## 2018-02-07 DIAGNOSIS — E118 Type 2 diabetes mellitus with unspecified complications: Secondary | ICD-10-CM | POA: Diagnosis not present

## 2018-02-07 DIAGNOSIS — Z87891 Personal history of nicotine dependence: Secondary | ICD-10-CM

## 2018-02-07 NOTE — Assessment & Plan Note (Signed)
The patient has persistent ulcerations.  Although they are improving, the skin is not yet intact and I do not think that coming out of the Unna boots is a good idea.  As such, 3 layer Unna boot's were placed bilaterally today.  These will be changed weekly by home health.  We will plan on checking a venous reflux study when he returns in 3 to 4 weeks.

## 2018-02-07 NOTE — Progress Notes (Signed)
MRN : [id number]  Tim Henry is a 84 y.o. (25-Dec-1927) male who presents with chief complaint of  Chief Complaint  Patient presents with  . Follow-up    4 week f/u  .  History of Present Illness: Patient returns today in follow up of leg swelling and ulceration.  The swelling and ulceration have gradually improved although he still has some shallow ulcerations on the right medial calf and lower leg.  The left leg has some scabs that are almost healed.  The swelling is down some mild in each lower extremity.  No fevers or chills.  No signs of infection.  Current Outpatient Medications  Medication Sig Dispense Refill  . ALPRAZolam (XANAX) 0.5 MG tablet Take 0.5 mg by mouth 2 (two) times daily. 1/2 tab, 1 PM    . atropine 1 % ophthalmic solution atropine 1 % eye drops    . carvedilol (COREG) 6.25 MG tablet   10  . Cholecalciferol (VITAMIN D-3 PO) Take by mouth daily. 10 AM    . Difluprednate (DUREZOL) 0.05 % EMUL     . dorzolamide-timolol (COSOPT) 22.3-6.8 MG/ML ophthalmic solution dorzolamide 22.3 mg-timolol 6.8 mg/mL eye drops    . ferrous sulfate 325 (65 FE) MG tablet Take 325 mg by mouth daily with breakfast.    . finasteride (PROSCAR) 5 MG tablet Take 1 tablet (5 mg total) by mouth daily. 30 tablet 11  . furosemide (LASIX) 20 MG tablet Take 20 mg by mouth daily.    Marland Kitchen gabapentin (NEURONTIN) 300 MG capsule Take 300 mg by mouth 2 (two) times daily. 1 PM, 11 PM    . HYDROcodone-acetaminophen (NORCO/VICODIN) 5-325 MG per tablet Take 1 tablet by mouth every 6 (six) hours as needed for moderate pain. 1 PM, 11PM    . latanoprost (XALATAN) 0.005 % ophthalmic solution 1 drop at bedtime.    . metFORMIN (GLUCOPHAGE) 500 MG tablet Take 1,000 mg by mouth 2 (two) times daily with a meal. 1 PM, 11 PM    . mirabegron ER (MYRBETRIQ) 25 MG TB24 tablet Take 1 tablet (25 mg total) by mouth daily for 28 days. 28 tablet 0  . Multiple Vitamin (MULTIVITAMIN) capsule Take 1 capsule by mouth daily.    .  nefazodone (SERZONE) 100 MG tablet Take by mouth.    . nortriptyline (PAMELOR) 25 MG capsule Take 25 mg by mouth at bedtime. 11 PM    . omeprazole (PRILOSEC) 20 MG capsule Take 20 mg by mouth daily. 6 PM    . potassium chloride (K-DUR) 10 MEQ tablet Take 10 mEq by mouth daily.    . sertraline (ZOLOFT) 100 MG tablet Take 100 mg by mouth daily. 6 PM    . simvastatin (ZOCOR) 10 MG tablet Take 10 mg by mouth daily. 10 AM    . sitaGLIPtin (JANUVIA) 100 MG tablet Take 100 mg by mouth daily.    . sucralfate (CARAFATE) 1 G tablet Take 1 g by mouth 2 (two) times daily. 10 AM, 11 PM    . tamsulosin (FLOMAX) 0.4 MG CAPS capsule Take 1 capsule (0.4 mg total) by mouth daily after breakfast. 1 PM 30 capsule 11  . telmisartan (MICARDIS) 40 MG tablet   5  . triamcinolone ointment (KENALOG) 0.1 % triamcinolone acetonide 0.1 % topical ointment    . valsartan (DIOVAN) 40 MG tablet Take 40 mg by mouth daily. 10 AM    . verapamil (COVERA HS) 240 MG (CO) 24 hr tablet Take 240 mg  by mouth daily. 6 PM     No current facility-administered medications for this visit.     Past Medical History:  Diagnosis Date  . Anemia    in past  . Anxiety   . Dementia (HCC)    able to sign own papers  . Depression   . Diabetes mellitus without complication (HCC)   . Full dentures    upper and lower  . GERD (gastroesophageal reflux disease)   . Head trauma    admitted to [facility], "pint of blood removed from head", no deficits after  . Hypercholesteremia   . Hypertension   . 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 APPENDECTOMY     childhood  . BACK SURGERY  1963  . CATARACT EXTRACTION W/PHACO Left 09/15/2014   Procedure: CATARACT EXTRACTION PHACO AND INTRAOCULAR LENS PLACEMENT (IOC);  Surgeon: Lockie Mola, MD;  Location: [facility];  Service: Ophthalmology;  Laterality: Left;  DIABETIC PT WOULD LIKE TO HAVE ARRIVAL TIME LATE  AM  . JOINT REPLACEMENT Left    knee  . PHOTOCOAGULATION WITH LASER Right 01/23/2016   Procedure: PHOTOCOAGULATION WITH LASER;  Surgeon: Sherald Hess, MD;  Location: [facility];  Service: Ophthalmology;  Laterality: Right;  DIABETIC - oral meds RIGHT Requests arrival 10AM or after    Social History        Tobacco Use  . Smoking status: Former Smoker    Last attempt to quit: 04/30/1966    Years since quitting: 51.7  . Smokeless tobacco: Never Used  Substance Use Topics  . Alcohol use: No  . Drug use: No     Family History      Family History  Problem Relation Age of Onset  . Heart attack Mother   . Diabetes Mother   . Heart attack Father   . Diabetes Sister      No Known Allergies   REVIEW OF SYSTEMS (Negative unless checked)  Constitutional: [] Weight loss  [] Fever  [] Chills Cardiac: [] Chest pain   [] Chest pressure   [] Palpitations   [] Shortness of breath when laying flat   [] Shortness of breath at rest   [] Shortness of breath with exertion. Vascular:  [] Pain in legs with walking   [] Pain in legs at rest   [] Pain in legs when laying flat   [] Claudication   [] Pain in feet when walking  [] Pain in feet at rest  [] Pain in feet when laying flat   [] History of DVT   [] Phlebitis   [x] Swelling in legs   [] Varicose veins   [x] Non-healing ulcers Pulmonary:   [] Uses home oxygen   [] Productive cough   [] Hemoptysis   [] Wheeze  [] COPD   [] Asthma Neurologic:  [] Dizziness  [] Blackouts   [] Seizures   [] History of stroke   [] History of TIA  [] Aphasia   [] Temporary blindness   [] Dysphagia   [] Weakness or numbness in arms   [] Weakness or numbness in legs Musculoskeletal:  [x] Arthritis   [] Joint swelling   [] Joint pain   [] Low back pain Hematologic:  [] Easy bruising  [] Easy bleeding   [] Hypercoagulable state   [] Anemic   Gastrointestinal:  [] Blood in stool   [] Vomiting blood  [] Gastroesophageal reflux/heartburn   [] Abdominal pain Genitourinary:  [x] Chronic  kidney disease   [] Difficult urination  [] Frequent urination  [] Burning with urination   [] Hematuria Skin:  [] Rashes   [x] Ulcers   [x] Wounds Psychological:  [] History  of anxiety   []  History of major depression.    Physical Examination  BP 139/72 (BP Location: Right Arm, Patient Position: Sitting)   Pulse 87   Resp 17   Ht 6' (1.829 m)   Wt 224 lb (101.6 kg)   BMI 30.38 kg/m  Gen:  WD/WN, NAD.  Appears younger than stated age Head: [place]/AT, No temporalis wasting. Ear/Nose/Throat: Hearing grossly intact, nares w/o erythema or drainage Eyes: Conjunctiva clear. Sclera non-icteric Neck: Supple.  Trachea midline Pulmonary:  Good air movement, no use of accessory muscles.  Cardiac: RRR, no JVD Vascular:  Vessel Right Left  Radial Palpable Palpable                                    Musculoskeletal: M/S 5/5 throughout.  No deformity or atrophy.  Shallow ulceration larger on the right medial calf and lower leg area with only scabs present now on the left.  Mild bilateral lower extremity edema. Neurologic: Sensation grossly intact in extremities.  Symmetrical.  Speech is fluent.  Psychiatric: Judgment intact, Mood & affect appropriate for pt's clinical situation. Dermatologic: Calf and ankle ulcerations as above.       Labs Recent Results (from the past 2160 hour(s))  Urinalysis, Complete     Status: None   Collection Time: 11/14/17 12:00 AM  Result Value Ref Range   Specific Gravity, UA 1.020 1.005 - 1.030   pH, UA 7.0 5.0 - 7.5   Color, UA Yellow Yellow   Appearance Ur Clear Clear   Leukocytes, UA Negative Negative   Protein, UA Negative Negative/Trace   Glucose, UA Negative Negative   Ketones, UA Negative Negative   RBC, UA Negative Negative   Bilirubin, UA Negative Negative   Urobilinogen, Ur 0.2 0.2 - 1.0 mg/dL   Nitrite, UA Negative Negative   Microscopic Examination See below:   Microscopic Examination     Status: Abnormal   Collection Time: 11/14/17 12:00  AM  Result Value Ref Range   WBC, UA 0-5 0 - 5 /hpf   RBC, UA 0-2 0 - 2 /hpf   Epithelial Cells (non renal) 0-10 0 - 10 /hpf   Renal Epithel, UA 0-10 None seen /hpf   Bacteria, UA Few (A) None seen/Few    Radiology No results found.  Assessment/Plan Essential hypertension blood pressure control important in reducing the progression of atherosclerotic disease. On appropriate oral medications.   Type 2 diabetes mellitus with complication (HCC) blood glucose control important in reducing the progression of atherosclerotic disease. Also, involved in wound healing. On appropriate medications.   Hyperlipidemia lipid control important in reducing the progression of atherosclerotic disease. Continue statin therapy  Lymphedema From chronic scarring of lymphatic channels. Compression and elevation.   Ulcers of both lower extremities, limited to breakdown of skin [facility]) The patient has persistent ulcerations.  Although they are improving, the skin is not yet intact and I do not think that coming out of the Unna boots is a good idea.  As such, 3 layer Unna boot's were placed bilaterally today.  These will be changed weekly by home health.  We will plan on checking a venous reflux study when he returns in 3 to 4 weeks.    Festus Barren, MD  02/07/2018 4:50 PM    This note was created with Dragon medical transcription system.  Any errors from dictation are purely unintentional

## 2018-02-07 NOTE — Assessment & Plan Note (Signed)
From chronic scarring of lymphatic channels. Compression and elevation.

## 2018-02-11 ENCOUNTER — Other Ambulatory Visit: Payer: Self-pay | Admitting: Family Medicine

## 2018-02-11 MED ORDER — FINASTERIDE 5 MG PO TABS: 5.0000 mg | ORAL_TABLET | Freq: Every day | ORAL | 11 refills | Status: DC

## 2018-02-28 ENCOUNTER — Ambulatory Visit (INDEPENDENT_AMBULATORY_CARE_PROVIDER_SITE_OTHER): Payer: Medicare Other | Admitting: Nurse Practitioner

## 2018-02-28 ENCOUNTER — Ambulatory Visit (INDEPENDENT_AMBULATORY_CARE_PROVIDER_SITE_OTHER): Payer: Medicare Other

## 2018-02-28 ENCOUNTER — Encounter (INDEPENDENT_AMBULATORY_CARE_PROVIDER_SITE_OTHER): Payer: Self-pay | Admitting: Nurse Practitioner

## 2018-02-28 VITALS — BP 150/83 | HR 86 | Resp 16 | Ht 72.0 in | Wt 226.0 lb

## 2018-02-28 DIAGNOSIS — L97911 Non-pressure chronic ulcer of unspecified part of right lower leg limited to breakdown of skin: Secondary | ICD-10-CM

## 2018-02-28 DIAGNOSIS — L03119 Cellulitis of unspecified part of limb: Secondary | ICD-10-CM | POA: Diagnosis not present

## 2018-02-28 DIAGNOSIS — E118 Type 2 diabetes mellitus with unspecified complications: Secondary | ICD-10-CM

## 2018-02-28 DIAGNOSIS — L97921 Non-pressure chronic ulcer of unspecified part of left lower leg limited to breakdown of skin: Secondary | ICD-10-CM

## 2018-02-28 DIAGNOSIS — I10 Essential (primary) hypertension: Secondary | ICD-10-CM

## 2018-02-28 DIAGNOSIS — I890 Lymphedema, not elsewhere classified: Secondary | ICD-10-CM

## 2018-02-28 DIAGNOSIS — Z87891 Personal history of nicotine dependence: Secondary | ICD-10-CM

## 2018-02-28 MED ORDER — DOXYCYCLINE HYCLATE 100 MG PO CAPS: 100.0000 mg | ORAL_CAPSULE | Freq: Two times a day (BID) | ORAL | 0 refills | Status: DC

## 2018-03-04 ENCOUNTER — Encounter (INDEPENDENT_AMBULATORY_CARE_PROVIDER_SITE_OTHER): Payer: Self-pay | Admitting: Nurse Practitioner

## 2018-03-04 NOTE — Progress Notes (Signed)
Subjective:    Patient ID: Tim Henry, male    DOB: 1928-01-06, 84 y.o.   MRN: [id number] Chief Complaint  Patient presents with  . Follow-up    3-4wk bil venous reflux     HPI  Tim Henry is a 84 y.o. male Patient is seen for follow up evaluation of leg pain and swelling associated with venous ulceration. The patient was recently seen here and started on Unna boot therapy.  The swelling abruptly became much worse bilaterally and is associated with pain and discoloration. The pain and swelling worsens with prolonged dependency and improves with elevation.  The patient notes that in the morning the legs are better but the leg symptoms worsened throughout the course of the day. The patient has also noted a progressive worsening of the discoloration in the ankle and shin area.   The patient notes that an ulcer has developed acutely without specific trauma and since it occurred it has been very slow to heal.  There is a moderate amount of drainage associated with the open area.  The wound is also very painful.   The patient states that they have been elevating as much as possible. The patient denies any recent changes in medications.  The patient denies a history of DVT or PE. There is no prior history of phlebitis. There is no history of primary lymphedema.  No SOB or increased cough.  No sputum production.  No recent episodes of CHF exacerbation.  Today Tim Henry underwent a bilateral lower extremity venous reflux study which found reflux in the common femoral vein in the right lower extremity as well as in the distal right saphenous vein of the right lower extremity.  The left lower extremity had reflux of the great saphenous vein.  There is no evidence of DVT bilaterally. .  There is evidence consisting with a chronic superficial venous thrombosis in these right small saphenous vein. Past Medical History:  Diagnosis Date  . Anemia    in past  . Anxiety   . Dementia (HCC)      able to sign own papers  . Depression   . Diabetes mellitus without complication (HCC)   . Full dentures    upper and lower  . GERD (gastroesophageal reflux disease)   . Head trauma    admitted to [facility], "pint of blood removed from head", no deficits after  . Hypercholesteremia   . Hypertension   . 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 APPENDECTOMY     childhood  . BACK SURGERY  1963  . CATARACT EXTRACTION W/PHACO Left 09/15/2014   Procedure: CATARACT EXTRACTION PHACO AND INTRAOCULAR LENS PLACEMENT (IOC);  Surgeon: Lockie Mola, MD;  Location: [facility];  Service: Ophthalmology;  Laterality: Left;  DIABETIC PT WOULD LIKE TO HAVE ARRIVAL TIME LATE AM  . JOINT REPLACEMENT Left    knee  . PHOTOCOAGULATION WITH LASER Right 01/23/2016   Procedure: PHOTOCOAGULATION WITH LASER;  Surgeon: Sherald Hess, MD;  Location: [facility];  Service: Ophthalmology;  Laterality: Right;  DIABETIC - oral meds RIGHT Requests arrival 10AM or after    Social History   Socioeconomic History  . Marital status: Married    Spouse name: Not on file  . Number of children: Not on file  . Years of education: Not on file  . Highest education level: Not on file  Occupational History  . Not on file  Social Needs  . Financial resource strain: Not on file  . Food insecurity:    Worry: Not on file    Inability: Not on file  . Transportation needs:    Medical: Not on file    Non-medical: Not on file  Tobacco Use  . Smoking status: Former Smoker    Last attempt to quit: 04/30/1966    Years since quitting: 51.8  . Smokeless tobacco: Never Used  Substance and Sexual Activity  . Alcohol use: No  . Drug use: No  . Sexual activity: Not on file  Lifestyle  . Physical activity:    Days per week: Not on file    Minutes per session: Not on file  . Stress: Not on file  Relationships  .  Social connections:    Talks on phone: Not on file    Gets together: Not on file    Attends religious service: Not on file    Active member of club or organization: Not on file    Attends meetings of clubs or organizations: Not on file    Relationship status: Not on file  . Intimate partner violence:    Fear of current or ex partner: Not on file    Emotionally abused: Not on file    Physically abused: Not on file    Forced sexual activity: Not on file  Other Topics Concern  . Not on file  Social History Narrative  . Not on file    Family History  Problem Relation Age of Onset  . Heart attack Mother   . Diabetes Mother   . Heart attack Father   . Diabetes Sister     No Known Allergies   Review of Systems   Review of Systems: Negative Unless Checked Constitutional: [] Weight loss  [] Fever  [] Chills Cardiac: [] Chest pain   []  Atrial Fibrillation  [] Palpitations   [] Shortness of breath when laying flat   [] Shortness of breath with exertion. Vascular:  [] Pain in legs with walking   [] Pain in legs with standing  [] History of DVT   [] Phlebitis   [] Swelling in legs   [] Varicose veins   [x] Non-healing ulcers Pulmonary:   [] Uses home oxygen   [] Productive cough   [] Hemoptysis   [] Wheeze  [] COPD   [] Asthma Neurologic:  [] Dizziness   [] Seizures   [] History of stroke   [] History of TIA  [] Aphasia   [] Vissual changes   [] Weakness or numbness in arm   [] Weakness or numbness in leg Musculoskeletal:   [] Joint swelling   [] Joint pain   [x] Low back pain  []  History of Knee Replacement Hematologic:  [] Easy bruising  [] Easy bleeding   [] Hypercoagulable state   [] Anemic Gastrointestinal:  [] Diarrhea   [] Vomiting  [] Gastroesophageal reflux/heartburn   [] Difficulty swallowing. Genitourinary:  [x] Chronic kidney disease   [] Difficult urination  [] Anuric   [] Blood in urine Skin:  [] Rashes   [x] Ulcers  Psychological:  [] History of anxiety   []  History of major depression  []  Memory Difficulties       Objective:   Physical Exam  BP (!) 150/83 (BP Location: Right Arm)   Pulse 86   Resp 16   Ht 6' (1.829 m)   Wt 226 lb (102.5 kg)   BMI 30.65 kg/m   Gen: WD/WN, NAD Head: [place]/AT, No temporalis wasting.  Ear/Nose/Throat: Hearing grossly intact, nares w/o erythema or drainage Eyes: PER, EOMI, sclera nonicteric.  Neck: Supple, no masses.  No JVD.  Pulmonary:  Good air movement, no use of accessory muscles.  Cardiac: RRR Vascular:  Bilateral ulcerations.  Erythematous bilaterally. Vessel Right Left  Radial Palpable Palpable   Gastrointestinal: soft, non-distended. No guarding/no peritoneal signs.  Musculoskeletal: M/S 5/5 throughout.  No deformity or atrophy.  Neurologic: Pain and light touch intact in extremities.  Symmetrical.  Speech is fluent. Motor exam as listed above. Psychiatric: Judgment intact, Mood & affect appropriate for pt's clinical situation. Dermatologic: No Venous rashes.  Ulcerations of calf and ankle bilaterally. No changes consistent with cellulitis. Lymph : No Cervical lymphadenopathy, no lichenification or skin changes of chronic lymphedema.      Assessment & Plan:   1. Lymphedema Today Tim Henry underwent a bilateral lower extremity venous reflux study which found reflux in the common femoral vein in the right lower extremity as well as in the distal right saphenous vein of the right lower extremity.  The left lower extremity had reflux of the great saphenous vein.  There is no evidence of DVT bilaterally. .  There is evidence consisting with a chronic superficial venous thrombosis in these right small saphenous vein.  Recommend  I have reviewed my previous  discussion with the patient regarding  varicose veins and why they cause symptoms. Patient will continue  wearing graduated compression stockings class 1 on a daily basis, beginning first thing in the morning and removing them in the evening.    In addition, behavioral modification including elevation  during the day was again discussed and this will continue.  The patient has utilized over the counter pain medications and has been exercising.  However, at this time conservative therapy has not alleviated the patient's symptoms of leg pain and swelling  Recommend: laser ablation of the right and  left great saphenous veins to eliminate the symptoms of pain and swelling of the lower extremities caused by the severe superficial venous reflux disease.   Endovenous laser ablation of the left lower extremity should take place first due to extensive venous ulceration.  2. Cellulitis of lower extremity, unspecified laterality Patient's bilateral lower extremities appear very erythematous as well as painful to the touch.  Will do short course of doxycycline to treat possible recurrent cellulitis.  - doxycycline (VIBRAMYCIN) 100 MG capsule; Take 1 capsule (100 mg total) by mouth 2 (two) times daily.  Dispense: 20 capsule; Refill: 0  3. Ulcers of both lower extremities, limited to breakdown of skin (HCC) Despite Unna wraps the patient continues to have ulcerations of both lower extremities.  This also may be a component of method of wrapping due to it being done by home health.  We will contact the home health agency to ensure that they have the correct materials as well as the correct technique.  We will continue to have him placed in in wraps.  4. Essential hypertension Continue antihypertensive medications as already ordered, these medications have been reviewed and there are no changes at this time.   5. Type 2 diabetes mellitus with complication (HCC) Continue hypoglycemic medications as already ordered, these medications have been reviewed and there are no changes at this time.  Hgb A1C to be monitored as already arranged by primary service      Current Outpatient Medications on File Prior to Visit  Medication Sig Dispense Refill  . ALPRAZolam (XANAX) 0.5 MG tablet Take 0.5 mg by mouth  2 (two) times daily. 1/2 tab, 1 PM    . atropine 1 % ophthalmic solution atropine 1 % eye drops    .  carvedilol (COREG) 6.25 MG tablet   10  . Cholecalciferol (VITAMIN D-3 PO) Take by mouth daily. 10 AM    . Difluprednate (DUREZOL) 0.05 % EMUL     . dorzolamide-timolol (COSOPT) 22.3-6.8 MG/ML ophthalmic solution dorzolamide 22.3 mg-timolol 6.8 mg/mL eye drops    . ferrous sulfate 325 (65 FE) MG tablet Take 325 mg by mouth daily with breakfast.    . finasteride (PROSCAR) 5 MG tablet Take 1 tablet (5 mg total) by mouth daily. 30 tablet 11  . furosemide (LASIX) 20 MG tablet Take 20 mg by mouth daily.    Marland Kitchen gabapentin (NEURONTIN) 300 MG capsule Take 300 mg by mouth 2 (two) times daily. 1 PM, 11 PM    . HYDROcodone-acetaminophen (NORCO/VICODIN) 5-325 MG per tablet Take 1 tablet by mouth every 6 (six) hours as needed for moderate pain. 1 PM, 11PM    . latanoprost (XALATAN) 0.005 % ophthalmic solution 1 drop at bedtime.    . metFORMIN (GLUCOPHAGE) 500 MG tablet Take 1,000 mg by mouth 2 (two) times daily with a meal. 1 PM, 11 PM    . Multiple Vitamin (MULTIVITAMIN) capsule Take 1 capsule by mouth daily.    . nefazodone (SERZONE) 100 MG tablet Take by mouth.    . nortriptyline (PAMELOR) 25 MG capsule Take 25 mg by mouth at bedtime. 11 PM    . omeprazole (PRILOSEC) 20 MG capsule Take 20 mg by mouth daily. 6 PM    . potassium chloride (K-DUR) 10 MEQ tablet Take 10 mEq by mouth daily.    . sertraline (ZOLOFT) 100 MG tablet Take 100 mg by mouth daily. 6 PM    . simvastatin (ZOCOR) 10 MG tablet Take 10 mg by mouth daily. 10 AM    . sitaGLIPtin (JANUVIA) 100 MG tablet Take 100 mg by mouth daily.    . sucralfate (CARAFATE) 1 G tablet Take 1 g by mouth 2 (two) times daily. 10 AM, 11 PM    . tamsulosin (FLOMAX) 0.4 MG CAPS capsule Take 1 capsule (0.4 mg total) by mouth daily after breakfast. 1 PM 30 capsule 11  . telmisartan (MICARDIS) 40 MG tablet   5  . triamcinolone ointment (KENALOG) 0.1 % triamcinolone  acetonide 0.1 % topical ointment    . valsartan (DIOVAN) 40 MG tablet Take 40 mg by mouth daily. 10 AM    . verapamil (COVERA HS) 240 MG (CO) 24 hr tablet Take 240 mg by mouth daily. 6 PM    . mirabegron ER (MYRBETRIQ) 25 MG TB24 tablet Take 1 tablet (25 mg total) by mouth daily for 28 days. 28 tablet 0   No current facility-administered medications on file prior to visit.     There are no Patient Instructions on file for this visit. No follow-ups on file.   Georgiana Spinner, NP  This note was completed with Office manager.  Any errors are purely unintentional.

## 2018-03-07 ENCOUNTER — Telehealth (INDEPENDENT_AMBULATORY_CARE_PROVIDER_SITE_OTHER): Payer: Self-pay

## 2018-03-07 NOTE — Telephone Encounter (Signed)
Nurse called from [facility] to ask if you would like to proceed with doing the Unna boots on this patient, as she was told by the son that his last ultrasound showed that he has decreased blood flow to his legs and that this causes skin tears?

## 2018-03-07 NOTE — Telephone Encounter (Signed)
Called the nurse back to let her know to continue the Unna boot per Dr. Wyn Quaker. She is going to call the patient to have him to make a follow appointment one month form the last visit that he had here, since he is not scheduled at this time.

## 2018-04-15 ENCOUNTER — Ambulatory Visit: Payer: Medicare Other | Admitting: Urology

## 2018-04-15 ENCOUNTER — Encounter: Payer: Self-pay | Admitting: Urology

## 2018-04-15 VITALS — BP 122/69 | HR 86 | Ht 71.0 in | Wt 224.5 lb

## 2018-04-15 DIAGNOSIS — M76829 Posterior tibial tendinitis, unspecified leg: Secondary | ICD-10-CM | POA: Insufficient documentation

## 2018-04-15 DIAGNOSIS — R350 Frequency of micturition: Secondary | ICD-10-CM | POA: Diagnosis not present

## 2018-04-15 DIAGNOSIS — E1142 Type 2 diabetes mellitus with diabetic polyneuropathy: Secondary | ICD-10-CM | POA: Insufficient documentation

## 2018-04-15 DIAGNOSIS — N401 Enlarged prostate with lower urinary tract symptoms: Secondary | ICD-10-CM | POA: Diagnosis not present

## 2018-04-15 DIAGNOSIS — M2010 Hallux valgus (acquired), unspecified foot: Secondary | ICD-10-CM | POA: Insufficient documentation

## 2018-04-15 DIAGNOSIS — B351 Tinea unguium: Secondary | ICD-10-CM | POA: Insufficient documentation

## 2018-04-15 NOTE — Progress Notes (Signed)
04/15/2018 3:55 PM   Tim Henry 12-Nov-1927 [id number]  Referring provider: Aniceto Boss, MD [address] [place], [place] [address]  Chief Complaint  Patient presents with  . Benign Prostatic Hypertrophy   Urologic history: 1.  BPH with lower urinary tract symptoms  -Followed since 2015; initially treated with tamsulosin with significant improvement in his symptoms  -Finasteride added for worsening LUTS  -Myrbetriq 25 mg added July 2019 for storage related symptoms  2.  Microhematuria evaluation September 2017  -Renal ultrasound bilateral simple cysts  -Cystoscopy with trilobar enlargement/prominent hypervascularity   HPI: 84 year old male presents for a 28-month follow-up.  He was last seen July 2019.  He still complains of urgency with occasional episodes of urge incontinence but does feel the Myrbetriq has helped the symptoms.  He is tolerating this medication without side effects.  Denies dysuria, gross hematuria.  His only other complaint was laxity of his scrotum   PMH: Past Medical History:  Diagnosis Date  . Anemia    in past  . Anxiety   . Dementia (HCC)    able to sign own papers  . Depression   . Diabetes mellitus without complication (HCC)   . Full dentures    upper and lower  . GERD (gastroesophageal reflux disease)   . Head trauma    admitted to [facility], "pint of blood removed from head", no deficits after  . Hypercholesteremia   . Hypertension   . 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 APPENDECTOMY     childhood  . BACK SURGERY  1963  . CATARACT EXTRACTION W/PHACO Left 09/15/2014   Procedure: CATARACT EXTRACTION PHACO AND INTRAOCULAR LENS PLACEMENT (IOC);  Surgeon: Lockie Mola, MD;  Location: [facility];  Service: Ophthalmology;  Laterality: Left;  DIABETIC PT WOULD LIKE TO HAVE ARRIVAL TIME LATE AM  . JOINT REPLACEMENT Left    knee  . PHOTOCOAGULATION WITH LASER Right 01/23/2016   Procedure: PHOTOCOAGULATION WITH LASER;  Surgeon: Sherald Hess, MD;  Location: [facility];  Service: Ophthalmology;  Laterality: Right;  DIABETIC - oral meds RIGHT Requests arrival 10AM or after    Home Medications:  Allergies as of 04/15/2018   No Known Allergies     Medication List       Accurate as of April 15, 2018  3:55 PM. Always use your most recent med list.        ALPRAZolam 0.5 MG tablet Commonly known as:  XANAX Take 0.5 mg by mouth 2 (two) times daily. 1/2 tab, 1 PM   atropine 1 % ophthalmic solution atropine 1 % eye drops   carvedilol 6.25 MG tablet Commonly known as:  COREG   dorzolamide-timolol 22.3-6.8 MG/ML ophthalmic solution Commonly known as:  COSOPT dorzolamide 22.3 mg-timolol 6.8 mg/mL eye drops   doxycycline 100 MG capsule Commonly known as:  VIBRAMYCIN Take 1 capsule (100 mg total) by mouth 2 (two) times daily.   DUREZOL 0.05 % Emul Generic drug:  Difluprednate   ferrous sulfate 325 (65 FE) MG tablet Take 325 mg by mouth daily with breakfast.   finasteride 5 MG tablet Commonly known as:  PROSCAR Take 1 tablet (5 mg total) by mouth daily.   furosemide 20 MG tablet Commonly known as:  LASIX Take 20 mg by mouth daily.   gabapentin 300 MG capsule Commonly known as:  NEURONTIN Take 300 mg  by mouth 2 (two) times daily. 1 PM, 11 PM   HYDROcodone-acetaminophen 5-325 MG tablet Commonly known as:  NORCO/VICODIN Take 1 tablet by mouth every 6 (six) hours as needed for moderate pain. 1 PM, 11PM   latanoprost 0.005 % ophthalmic solution Commonly known as:  XALATAN 1 drop at bedtime.   metFORMIN 500 MG tablet Commonly known as:  GLUCOPHAGE Take 1,000 mg by mouth 2 (two) times daily with a meal. 1 PM, 11 PM   mirabegron ER 25 MG Tb24 tablet Commonly known as:  MYRBETRIQ Take 1 tablet (25 mg total) by mouth daily for 28 days.   multivitamin capsule Take 1  capsule by mouth daily.   nefazodone 100 MG tablet Commonly known as:  SERZONE Take by mouth.   omeprazole 40 MG capsule Commonly known as:  PRILOSEC   potassium chloride 10 MEQ tablet Commonly known as:  K-DUR Take 10 mEq by mouth daily.   sertraline 100 MG tablet Commonly known as:  ZOLOFT Take 100 mg by mouth daily. 6 PM   simvastatin 10 MG tablet Commonly known as:  ZOCOR Take 10 mg by mouth daily. 10 AM   sitaGLIPtin 100 MG tablet Commonly known as:  JANUVIA Take 100 mg by mouth daily.   sucralfate 1 g tablet Commonly known as:  CARAFATE Take 1 g by mouth 2 (two) times daily. 10 AM, 11 PM   tamsulosin 0.4 MG Caps capsule Commonly known as:  FLOMAX Take 1 capsule (0.4 mg total) by mouth daily after breakfast. 1 PM   telmisartan 40 MG tablet Commonly known as:  MICARDIS   triamcinolone ointment 0.1 % Commonly known as:  KENALOG triamcinolone acetonide 0.1 % topical ointment   verapamil 240 MG (CO) 24 hr tablet Commonly known as:  COVERA HS Take 240 mg by mouth daily. 6 PM   Vitamin D (Ergocalciferol) 1.25 MG (50000 UT) Caps capsule Commonly known as:  DRISDOL   VITAMIN D-3 PO Take by mouth daily. 10 AM       Allergies: No Known Allergies  Family History: Family History  Problem Relation Age of Onset  . Heart attack Mother   . Diabetes Mother   . Heart attack Father   . Diabetes Sister     Social History:  reports that he quit smoking about 51 years ago. He has never used smokeless tobacco. He reports that he does not drink alcohol or use drugs.  ROS: UROLOGY Frequent Urination?: Yes Hard to postpone urination?: Yes Burning/pain with urination?: No Get up at night to urinate?: Yes Leakage of urine?: Yes Urine stream starts and stops?: No Trouble starting stream?: No Do you have to strain to urinate?: No Blood in urine?: No Urinary tract infection?: No Sexually transmitted disease?: No Injury to kidneys or bladder?: No Painful  intercourse?: No Weak stream?: No Erection problems?: Yes Penile pain?: No  Gastrointestinal Nausea?: No Vomiting?: No Indigestion/heartburn?: No Diarrhea?: Yes Constipation?: No  Constitutional Fever: No Night sweats?: No Weight loss?: No Fatigue?: No  Skin Skin rash/lesions?: No Itching?: No  Eyes Blurred vision?: No Double vision?: No  Ears/Nose/Throat Sore throat?: No Sinus problems?: No  Hematologic/Lymphatic Swollen glands?: No Easy bruising?: No  Cardiovascular Leg swelling?: Yes Chest pain?: No  Respiratory Cough?: No Shortness of breath?: No  Endocrine Excessive thirst?: No  Musculoskeletal Back pain?: Yes Joint pain?: No  Neurological Headaches?: No Dizziness?: No  Psychologic Depression?: No Anxiety?: Yes  Physical Exam: BP 122/69 (BP Location: Left Arm, Patient Position: Sitting,  Cuff Size: Large)   Pulse 86   Ht 5\' 11"  (1.803 m)   Wt 224 lb 8 oz (101.8 kg)   BMI 31.31 kg/m   Constitutional:  Alert and oriented, No acute distress. HEENT: [place] AT, moist mucus membranes.  Trachea midline, no masses. Cardiovascular: No clubbing, cyanosis, or edema. Respiratory: Normal respiratory effort, no increased work of breathing. GI: Abdomen is soft, nontender, nondistended, no abdominal masses GU: No CVA tenderness Lymph: No cervical or inguinal lymphadenopathy. Skin: No rashes, bruises or suspicious lesions. Neurologic: Grossly intact, no focal deficits, moving all 4 extremities. Psychiatric: Normal mood and affect.  Assessment & Plan:   Stable lower urinary tract symptoms on tamsulosin and finasteride.  His storage related symptoms have improved on Myrbetriq 25 mg however still persist.  Will increase to 25 mg.  Recommended scrotal support or supportive underwear for his scrotal complaints.   Return in about 1 year (around 04/16/2019) for Recheck.  Riki Altes, MD  [facility] [address] [place], [place] [address] [phone]

## 2018-04-16 ENCOUNTER — Encounter: Payer: Self-pay | Admitting: Urology

## 2018-04-18 ENCOUNTER — Other Ambulatory Visit: Payer: Self-pay | Admitting: Urology

## 2018-04-18 MED ORDER — MIRABEGRON ER 25 MG PO TB24: 25.0000 mg | ORAL_TABLET | Freq: Every day | ORAL | 0 refills | Status: DC

## 2018-04-18 MED ORDER — TAMSULOSIN HCL 0.4 MG PO CAPS: 0.4000 mg | ORAL_CAPSULE | Freq: Every day | ORAL | 11 refills | Status: DC

## 2018-04-18 NOTE — Telephone Encounter (Signed)
Pt was seen Tuesday, daughter had to leave to go to her appt, pt stated he was supposed to get refills but the pharmacy doesn't have anything. Please [facility]. Please advise.

## 2018-05-13 DIAGNOSIS — R200 Anesthesia of skin: Secondary | ICD-10-CM | POA: Insufficient documentation

## 2018-05-19 ENCOUNTER — Encounter (INDEPENDENT_AMBULATORY_CARE_PROVIDER_SITE_OTHER): Payer: Self-pay | Admitting: Nurse Practitioner

## 2018-05-19 ENCOUNTER — Other Ambulatory Visit: Payer: Self-pay

## 2018-05-19 ENCOUNTER — Ambulatory Visit (INDEPENDENT_AMBULATORY_CARE_PROVIDER_SITE_OTHER): Payer: Medicare Other | Admitting: Nurse Practitioner

## 2018-05-19 VITALS — BP 129/76 | HR 87 | Resp 17 | Ht 71.0 in | Wt 224.0 lb

## 2018-05-19 DIAGNOSIS — E118 Type 2 diabetes mellitus with unspecified complications: Secondary | ICD-10-CM

## 2018-05-19 DIAGNOSIS — I890 Lymphedema, not elsewhere classified: Secondary | ICD-10-CM | POA: Diagnosis not present

## 2018-05-19 DIAGNOSIS — I10 Essential (primary) hypertension: Secondary | ICD-10-CM

## 2018-05-23 ENCOUNTER — Encounter (INDEPENDENT_AMBULATORY_CARE_PROVIDER_SITE_OTHER): Payer: Self-pay | Admitting: Nurse Practitioner

## 2018-05-23 NOTE — Progress Notes (Signed)
Subjective:    Patient ID: Tim Henry, male    DOB: [birth year], 85 y.o.   MRN: [id number] Chief Complaint  Patient presents with  . Follow-up    leakage on right LE    HPI  Tim Henry is a 85 y.o. male that presents today for concern for leakage on his right lower extremity.  He appears to have several small areas that appear to be more like a skin tear is rather than ulcerations.  He has minimal swelling at this time.  He does not really appear to have leaking that is present.  He has been getting weekly Unna wraps done by home health.  His son is concerned about the skin tear-like wounds.  He feels that the wound wraps are making them worse.  He thinks that a air with topical treatment may be better for these.  He would like to try to go without the wound wraps for some time.  The patient denies any fever, chills, nausea, vomiting or diarrhea.  He denies any chest pain or shortness of breath.  He denies any TIA-like symptoms.  He has an upcoming endovenous laser procedure on 05/30/2017.  Past Medical History:  Diagnosis Date  . Anemia    in past  . Anxiety   . Dementia (HCC)    able to sign own papers  . Depression   . Diabetes mellitus without complication (HCC)   . Full dentures    upper and lower  . GERD (gastroesophageal reflux disease)   . Head trauma    admitted to [facility], "pint of blood removed from head", no deficits after  . Hypercholesteremia   . Hypertension   . 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 APPENDECTOMY     childhood  . BACK SURGERY  1963  . CATARACT EXTRACTION W/PHACO Left 09/15/2014   Procedure: CATARACT EXTRACTION PHACO AND INTRAOCULAR LENS PLACEMENT (IOC);  Surgeon: Lockie Mola, MD;  Location: [facility];  Service: Ophthalmology;  Laterality: Left;  DIABETIC PT WOULD LIKE TO HAVE ARRIVAL TIME LATE AM  . JOINT REPLACEMENT Left    knee  .  PHOTOCOAGULATION WITH LASER Right 01/23/2016   Procedure: PHOTOCOAGULATION WITH LASER;  Surgeon: Sherald Hess, MD;  Location: [facility];  Service: Ophthalmology;  Laterality: Right;  DIABETIC - oral meds RIGHT Requests arrival 10AM or after    Social History   Socioeconomic History  . Marital status: Married    Spouse name: Not on file  . Number of children: Not on file  . Years of education: Not on file  . Highest education level: Not on file  Occupational History  . Not on file  Social Needs  . Financial resource strain: Not on file  . Food insecurity:    Worry: Not on file    Inability: Not on file  . Transportation needs:    Medical: Not on file    Non-medical: Not on file  Tobacco Use  . Smoking status: Former Smoker    Last attempt to quit: 04/30/1966    Years since quitting: 52.0  . Smokeless tobacco: Never Used  Substance and Sexual Activity  . Alcohol use: No  . Drug use: No  . Sexual activity: Not on file  Lifestyle  . Physical activity:    Days per week: Not on file    Minutes per session: Not  on file  . Stress: Not on file  Relationships  . Social connections:    Talks on phone: Not on file    Gets together: Not on file    Attends religious service: Not on file    Active member of club or organization: Not on file    Attends meetings of clubs or organizations: Not on file    Relationship status: Not on file  . Intimate partner violence:    Fear of current or ex partner: Not on file    Emotionally abused: Not on file    Physically abused: Not on file    Forced sexual activity: Not on file  Other Topics Concern  . Not on file  Social History Narrative  . Not on file    Family History  Problem Relation Age of Onset  . Heart attack Mother   . Diabetes Mother   . Heart attack Father   . Diabetes Sister     No Known Allergies   Review of Systems   Review of Systems: Negative Unless Checked Constitutional: [] Weight loss   [] Fever  [] Chills Cardiac: [] Chest pain   []  Atrial Fibrillation  [] Palpitations   [] Shortness of breath when laying flat   [] Shortness of breath with exertion. [] Shortness of breath at rest Vascular:  [] Pain in legs with walking   [] Pain in legs with standing [] Pain in legs when laying flat   [] Claudication    [] Pain in feet when laying flat    [] History of DVT   [] Phlebitis   [x] Swelling in legs   [] Varicose veins   [] Non-healing ulcers Pulmonary:   [] Uses home oxygen   [] Productive cough   [] Hemoptysis   [] Wheeze  [] COPD   [] Asthma Neurologic:  [] Dizziness   [] Seizures  [] Blackouts [] History of stroke   [] History of TIA  [] Aphasia   [] Temporary Blindness   [] Weakness or numbness in arm   [] Weakness or numbness in leg Musculoskeletal:   [x] Joint swelling   [] Joint pain   [] Low back pain  []  History of Knee Replacement [] Arthritis [] back Surgeries  []  Spinal Stenosis    Hematologic:  [] Easy bruising  [] Easy bleeding   [] Hypercoagulable state   [] Anemic Gastrointestinal:  [] Diarrhea   [] Vomiting  [] Gastroesophageal reflux/heartburn   [] Difficulty swallowing. [] Abdominal pain Genitourinary:  [] Chronic kidney disease   [] Difficult urination  [] Anuric   [] Blood in urine [x] Frequent urination  [] Burning with urination   [] Hematuria Skin:  [] Rashes   [] Ulcers [x] Wounds Psychological:  [] History of anxiety   []  History of major depression  []  Memory Difficulties     Objective:   Physical Exam  BP 129/76 (BP Location: Right Arm)   Pulse 87   Resp 17   Ht 5\' 11"  (1.803 m)   Wt 224 lb (101.6 kg)   BMI 31.24 kg/m   Gen: WD/WN, NAD Head: [place]/AT, No temporalis wasting.  Ear/Nose/Throat: Hearing grossly intact, nares w/o erythema or drainage Eyes: PER, EOMI, sclera nonicteric.  Neck: Supple, no masses.  No JVD.  Pulmonary:  Good air movement, no use of accessory muscles.  Cardiac: RRR Vascular:  1+ soft edema Vessel Right Left  Radial Palpable Palpable   Gastrointestinal: soft, non-distended. No  guarding/no peritoneal signs.  Musculoskeletal: M/S 5/5 throughout.  No deformity or atrophy.  Neurologic: Pain and light touch intact in extremities.  Symmetrical.  Speech is fluent. Motor exam as listed above. Psychiatric: Judgment intact, Mood & affect appropriate for pt's clinical situation. Dermatologic:  Multiple small skin tear-like wounds  on his bilateral lower extremities.  Extensive skin flaking No changes consistent with cellulitis. Lymph : No Cervical lymphadenopathy, bilateral lichenification      Assessment & Plan:   1. Lymphedema Today the patient swelling appears greatly improved, however there is still small open areas on his bilateral lower extremities.  After discussion with the patient's son, they would like to try to resolve these skin tear like wounds by leaving them open to air and using more topical treatment options.  I spoke extensively with patient and his son regarding the fact that while this is an acceptable option, the patient will need to be very diligent about elevating his lower extremities.  The patient has a longstanding history of multiple ulcerations on his lower extremities.  I advised the patient and his son that if he stays dependent for long periods of time the likelihood that he will swell and re-ulcerate is very high.  The patient states that they would be very diligent while his wounds are still healing.  The patient has an upcoming procedure office on 05/30/2018, we will check his lower extremity wounds at that time determine if Unna wrap therapy is necessary or if the patient can continue to utilize other conservative options.  2. Essential hypertension Continue antihypertensive medications as already ordered, these medications have been reviewed and there are no changes at this time.   3. Type 2 diabetes mellitus with complication (HCC) Continue hypoglycemic medications as already ordered, these medications have been reviewed and there are no  changes at this time.  Hgb A1C to be monitored as already arranged by primary service    Current Outpatient Medications on File Prior to Visit  Medication Sig Dispense Refill  . ALPRAZolam (XANAX) 0.5 MG tablet Take 0.5 mg by mouth 2 (two) times daily. 1/2 tab, 1 PM    . atropine 1 % ophthalmic solution atropine 1 % eye drops    . carvedilol (COREG) 6.25 MG tablet   10  . Cholecalciferol (VITAMIN D-3 PO) Take by mouth daily. 10 AM    . Difluprednate (DUREZOL) 0.05 % EMUL     . dorzolamide-timolol (COSOPT) 22.3-6.8 MG/ML ophthalmic solution dorzolamide 22.3 mg-timolol 6.8 mg/mL eye drops    . doxycycline (VIBRAMYCIN) 100 MG capsule Take 1 capsule (100 mg total) by mouth 2 (two) times daily. 20 capsule 0  . ferrous sulfate 325 (65 FE) MG tablet Take 325 mg by mouth daily with breakfast.    . finasteride (PROSCAR) 5 MG tablet Take 1 tablet (5 mg total) by mouth daily. 30 tablet 11  . furosemide (LASIX) 20 MG tablet Take 20 mg by mouth daily.    Marland Kitchen. gabapentin (NEURONTIN) 300 MG capsule Take 300 mg by mouth 2 (two) times daily. 1 PM, 11 PM    . HYDROcodone-acetaminophen (NORCO/VICODIN) 5-325 MG per tablet Take 1 tablet by mouth every 6 (six) hours as needed for moderate pain. 1 PM, 11PM    . latanoprost (XALATAN) 0.005 % ophthalmic solution 1 drop at bedtime.    . metFORMIN (GLUCOPHAGE) 500 MG tablet Take 1,000 mg by mouth 2 (two) times daily with a meal. 1 PM, 11 PM    . Multiple Vitamin (MULTIVITAMIN) capsule Take 1 capsule by mouth daily.    . nefazodone (SERZONE) 100 MG tablet Take by mouth.    Marland Kitchen. omeprazole (PRILOSEC) 40 MG capsule   5  . potassium chloride (K-DUR) 10 MEQ tablet Take 10 mEq by mouth daily.    . sertraline (ZOLOFT) 100  MG tablet Take 100 mg by mouth daily. 6 PM    . simvastatin (ZOCOR) 10 MG tablet Take 10 mg by mouth daily. 10 AM    . sitaGLIPtin (JANUVIA) 100 MG tablet Take 100 mg by mouth daily.    . sucralfate (CARAFATE) 1 G tablet Take 1 g by mouth 2 (two) times daily.  10 AM, 11 PM    . tamsulosin (FLOMAX) 0.4 MG CAPS capsule Take 1 capsule (0.4 mg total) by mouth daily after breakfast. 1 PM 30 capsule 11  . telmisartan (MICARDIS) 40 MG tablet   5  . triamcinolone ointment (KENALOG) 0.1 % triamcinolone acetonide 0.1 % topical ointment    . verapamil (COVERA HS) 240 MG (CO) 24 hr tablet Take 240 mg by mouth daily. 6 PM    . Vitamin D, Ergocalciferol, (DRISDOL) 1.25 MG (50000 UT) CAPS capsule   5  . mirabegron ER (MYRBETRIQ) 25 MG TB24 tablet Take 1 tablet (25 mg total) by mouth daily for 28 days. 28 tablet 0   No current facility-administered medications on file prior to visit.     There are no Patient Instructions on file for this visit. No follow-ups on file.   Georgiana Spinner, NP  This note was completed with Office manager.  Any errors are purely unintentional.

## 2018-05-30 ENCOUNTER — Ambulatory Visit (INDEPENDENT_AMBULATORY_CARE_PROVIDER_SITE_OTHER): Payer: Medicare Other | Admitting: Vascular Surgery

## 2018-05-30 DIAGNOSIS — I83022 Varicose veins of left lower extremity with ulcer of calf: Secondary | ICD-10-CM

## 2018-05-30 DIAGNOSIS — I83009 Varicose veins of unspecified lower extremity with ulcer of unspecified site: Secondary | ICD-10-CM | POA: Insufficient documentation

## 2018-05-30 DIAGNOSIS — L97221 Non-pressure chronic ulcer of left calf limited to breakdown of skin: Secondary | ICD-10-CM

## 2018-05-30 DIAGNOSIS — L97909 Non-pressure chronic ulcer of unspecified part of unspecified lower leg with unspecified severity: Secondary | ICD-10-CM

## 2018-05-30 NOTE — Progress Notes (Signed)
Tim Henry is a 85 y.o. male who presents with symptomatic venous reflux  Past Medical History:  Diagnosis Date  . Anemia    in past  . Anxiety   . Dementia (HCC)    able to sign own papers  . Depression   . Diabetes mellitus without complication (HCC)   . Full dentures    upper and lower  . GERD (gastroesophageal reflux disease)   . Head trauma    admitted to [facility], "pint of blood removed from head", no deficits after  . Hypercholesteremia   . Hypertension   . 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 APPENDECTOMY     childhood  . BACK SURGERY  1963  . CATARACT EXTRACTION W/PHACO Left 09/15/2014   Procedure: CATARACT EXTRACTION PHACO AND INTRAOCULAR LENS PLACEMENT (IOC);  Surgeon: Lockie Mola, MD;  Location: [facility];  Service: Ophthalmology;  Laterality: Left;  DIABETIC PT WOULD LIKE TO HAVE ARRIVAL TIME LATE AM  . JOINT REPLACEMENT Left    knee  . PHOTOCOAGULATION WITH LASER Right 01/23/2016   Procedure: PHOTOCOAGULATION WITH LASER;  Surgeon: Sherald Hess, MD;  Location: [facility];  Service: Ophthalmology;  Laterality: Right;  DIABETIC - oral meds RIGHT Requests arrival 10AM or after     Current Outpatient Medications:  .  ALPRAZolam (XANAX) 0.5 MG tablet, Take 0.5 mg by mouth 2 (two) times daily. 1/2 tab, 1 PM, Disp: , Rfl:  .  atropine 1 % ophthalmic solution, atropine 1 % eye drops, Disp: , Rfl:  .  carvedilol (COREG) 6.25 MG tablet, , Disp: , Rfl: 10 .  Cholecalciferol (VITAMIN D-3 PO), Take by mouth daily. 10 AM, Disp: , Rfl:  .  Difluprednate (DUREZOL) 0.05 % EMUL, , Disp: , Rfl:  .  dorzolamide-timolol (COSOPT) 22.3-6.8 MG/ML ophthalmic solution, dorzolamide 22.3 mg-timolol 6.8 mg/mL eye drops, Disp: , Rfl:  .  doxycycline (VIBRAMYCIN) 100 MG capsule, Take 1 capsule (100 mg total) by mouth 2 (two) times daily., Disp: 20 capsule, Rfl: 0 .   ferrous sulfate 325 (65 FE) MG tablet, Take 325 mg by mouth daily with breakfast., Disp: , Rfl:  .  finasteride (PROSCAR) 5 MG tablet, Take 1 tablet (5 mg total) by mouth daily., Disp: 30 tablet, Rfl: 11 .  furosemide (LASIX) 20 MG tablet, Take 20 mg by mouth daily., Disp: , Rfl:  .  gabapentin (NEURONTIN) 300 MG capsule, Take 300 mg by mouth 2 (two) times daily. 1 PM, 11 PM, Disp: , Rfl:  .  HYDROcodone-acetaminophen (NORCO/VICODIN) 5-325 MG per tablet, Take 1 tablet by mouth every 6 (six) hours as needed for moderate pain. 1 PM, 11PM, Disp: , Rfl:  .  latanoprost (XALATAN) 0.005 % ophthalmic solution, 1 drop at bedtime., Disp: , Rfl:  .  metFORMIN (GLUCOPHAGE) 500 MG tablet, Take 1,000 mg by mouth 2 (two) times daily with a meal. 1 PM, 11 PM, Disp: , Rfl:  .  mirabegron ER (MYRBETRIQ) 25 MG TB24 tablet, Take 1 tablet (25 mg total) by mouth daily for 28 days., Disp: 28 tablet, Rfl: 0 .  Multiple Vitamin (MULTIVITAMIN) capsule, Take 1 capsule by mouth daily., Disp: , Rfl:  .  nefazodone (SERZONE) 100 MG tablet, Take by mouth., Disp: , Rfl:  .  omeprazole (PRILOSEC) 40 MG capsule, , Disp: , Rfl: 5 .  potassium chloride (K-DUR) 10 MEQ tablet, Take 10  mEq by mouth daily., Disp: , Rfl:  .  sertraline (ZOLOFT) 100 MG tablet, Take 100 mg by mouth daily. 6 PM, Disp: , Rfl:  .  simvastatin (ZOCOR) 10 MG tablet, Take 10 mg by mouth daily. 10 AM, Disp: , Rfl:  .  sitaGLIPtin (JANUVIA) 100 MG tablet, Take 100 mg by mouth daily., Disp: , Rfl:  .  sucralfate (CARAFATE) 1 G tablet, Take 1 g by mouth 2 (two) times daily. 10 AM, 11 PM, Disp: , Rfl:  .  tamsulosin (FLOMAX) 0.4 MG CAPS capsule, Take 1 capsule (0.4 mg total) by mouth daily after breakfast. 1 PM, Disp: 30 capsule, Rfl: 11 .  telmisartan (MICARDIS) 40 MG tablet, , Disp: , Rfl: 5 .  triamcinolone ointment (KENALOG) 0.1 %, triamcinolone acetonide 0.1 % topical ointment, Disp: , Rfl:  .  verapamil (COVERA HS) 240 MG (CO) 24 hr tablet, Take 240 mg by  mouth daily. 6 PM, Disp: , Rfl:  .  Vitamin D, Ergocalciferol, (DRISDOL) 1.25 MG (50000 UT) CAPS capsule, , Disp: , Rfl: 5  No Known Allergies   No problem-specific Assessment & Plan notes found for this encounter.   PLAN: The patient's left lower extremity was sterilely prepped and draped. The ultrasound machine was used to visualize the saphenous vein throughout its course. A segment in the in the mid to upper calf was selected for access. The saphenous vein was accessed without difficulty using ultrasound guidance with a micropuncture needle. A 0.018 wire was then placed beyond the saphenofemoral junction and the needle was removed. The 65 cm sheath was then placed over the wire and the wire and dilator were removed. The laser fiber was then placed through the sheath and its tip was placed approximately 4-5 centimeters below the saphenofemoral junction. Tumescent anesthesia was then created with a dilute lidocaine solution. Laser energy was then delivered with constant withdrawal of the sheath and laser fiber. Approximately 1387 joules of energy were delivered over a length of 41 centimeters using a 1470 Hz VenaCure machine at 7 W. Sterile dressings were placed. The patient tolerated the procedure well without obvious complications.   Follow-up in 1 week with post-laser duplex.

## 2018-06-02 ENCOUNTER — Ambulatory Visit (INDEPENDENT_AMBULATORY_CARE_PROVIDER_SITE_OTHER): Payer: Medicare Other

## 2018-06-02 DIAGNOSIS — L97221 Non-pressure chronic ulcer of left calf limited to breakdown of skin: Secondary | ICD-10-CM | POA: Diagnosis not present

## 2018-06-02 DIAGNOSIS — I83022 Varicose veins of left lower extremity with ulcer of calf: Secondary | ICD-10-CM | POA: Diagnosis not present

## 2018-06-09 ENCOUNTER — Telehealth (INDEPENDENT_AMBULATORY_CARE_PROVIDER_SITE_OTHER): Payer: Self-pay | Admitting: Nurse Practitioner

## 2018-06-09 NOTE — Telephone Encounter (Signed)
Tim Henry was informed to do spot treatment on tears.

## 2018-06-09 NOTE — Telephone Encounter (Signed)
Patient requested to not be in [facility], can do spot treatment for skin tears

## 2018-06-19 ENCOUNTER — Telehealth (INDEPENDENT_AMBULATORY_CARE_PROVIDER_SITE_OTHER): Payer: Self-pay

## 2018-06-30 ENCOUNTER — Other Ambulatory Visit: Payer: Self-pay

## 2018-06-30 ENCOUNTER — Encounter (INDEPENDENT_AMBULATORY_CARE_PROVIDER_SITE_OTHER): Payer: Self-pay | Admitting: Nurse Practitioner

## 2018-06-30 ENCOUNTER — Ambulatory Visit (INDEPENDENT_AMBULATORY_CARE_PROVIDER_SITE_OTHER): Payer: Medicare Other | Admitting: Nurse Practitioner

## 2018-06-30 ENCOUNTER — Telehealth (INDEPENDENT_AMBULATORY_CARE_PROVIDER_SITE_OTHER): Payer: Self-pay | Admitting: Nurse Practitioner

## 2018-06-30 VITALS — BP 148/81 | HR 78 | Resp 12 | Ht 71.0 in | Wt 230.0 lb

## 2018-06-30 DIAGNOSIS — I83022 Varicose veins of left lower extremity with ulcer of calf: Secondary | ICD-10-CM | POA: Diagnosis not present

## 2018-06-30 DIAGNOSIS — L97221 Non-pressure chronic ulcer of left calf limited to breakdown of skin: Secondary | ICD-10-CM

## 2018-06-30 DIAGNOSIS — I890 Lymphedema, not elsewhere classified: Secondary | ICD-10-CM | POA: Diagnosis not present

## 2018-06-30 DIAGNOSIS — E118 Type 2 diabetes mellitus with unspecified complications: Secondary | ICD-10-CM | POA: Diagnosis not present

## 2018-06-30 DIAGNOSIS — Z7984 Long term (current) use of oral hypoglycemic drugs: Secondary | ICD-10-CM

## 2018-06-30 DIAGNOSIS — E785 Hyperlipidemia, unspecified: Secondary | ICD-10-CM

## 2018-06-30 DIAGNOSIS — Z79899 Other long term (current) drug therapy: Secondary | ICD-10-CM

## 2018-06-30 NOTE — Progress Notes (Addendum)
SUBJECTIVE:  Patient ID: Tim Henry, male    DOB: 1928-01-28, 85 y.o.   MRN: [id number] Chief Complaint  Patient presents with  . Follow-up    HPI  Tim Henry is a 85 y.o. male The patient returns to the office for followup status post laser ablation of the left great saphenous vein on 05/30/2018 . The patient notes multiple residual varicosities bilaterally which continued to hurt with dependent positions and remained tender to palpation. The patient's swelling is unchanged from preoperative status. The patient continues to wear graduated compression stockings on a daily basis but these are not eliminating the pain and discomfort. The patient continues to use over-the-counter anti-inflammatory medications to treat the pain and related symptoms but this has not given the patient relief. The patient notes the pain in the lower extremities is causing problems with daily exercise, problems at work and even with household activities such as preparing meals and doing dishes.  The patient is otherwise done well and there have been no complications related to the laser procedure or interval changes in the patient's overall   Venous ultrasound post laser shows successful laser ablation of the left lower extremity, no DVT identified.  Past Medical History:  Diagnosis Date  . Anemia    in past  . Anxiety   . Dementia (HCC)    able to sign own papers  . Depression   . Diabetes mellitus without complication (HCC)   . Full dentures    upper and lower  . GERD (gastroesophageal reflux disease)   . Head trauma    admitted to [facility], "pint of blood removed from head", no deficits after  . Hypercholesteremia   . Hypertension   . 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 APPENDECTOMY     childhood  . BACK SURGERY  1963  . CATARACT EXTRACTION W/PHACO Left 09/15/2014   Procedure: CATARACT EXTRACTION PHACO AND  INTRAOCULAR LENS PLACEMENT (IOC);  Surgeon: Lockie Mola, MD;  Location: [facility];  Service: Ophthalmology;  Laterality: Left;  DIABETIC PT WOULD LIKE TO HAVE ARRIVAL TIME LATE AM  . JOINT REPLACEMENT Left    knee  . PHOTOCOAGULATION WITH LASER Right 01/23/2016   Procedure: PHOTOCOAGULATION WITH LASER;  Surgeon: Sherald Hess, MD;  Location: [facility];  Service: Ophthalmology;  Laterality: Right;  DIABETIC - oral meds RIGHT Requests arrival 10AM or after    Social History   Socioeconomic History  . Marital status: Married    Spouse name: Not on file  . Number of children: Not on file  . Years of education: Not on file  . Highest education level: Not on file  Occupational History  . Not on file  Social Needs  . Financial resource strain: Not on file  . Food insecurity:    Worry: Not on file    Inability: Not on file  . Transportation needs:    Medical: Not on file    Non-medical: Not on file  Tobacco Use  . Smoking status: Former Smoker    Last attempt to quit: 04/30/1966    Years since quitting: 52.2  . Smokeless tobacco: Never Used  Substance and Sexual Activity  . Alcohol use: No  . Drug use: No  . Sexual activity: Not on file  Lifestyle  . Physical activity:    Days per week: Not on file  Minutes per session: Not on file  . Stress: Not on file  Relationships  . Social connections:    Talks on phone: Not on file    Gets together: Not on file    Attends religious service: Not on file    Active member of club or organization: Not on file    Attends meetings of clubs or organizations: Not on file    Relationship status: Not on file  . Intimate partner violence:    Fear of current or ex partner: Not on file    Emotionally abused: Not on file    Physically abused: Not on file    Forced sexual activity: Not on file  Other Topics Concern  . Not on file  Social History Narrative  . Not on file    Family History  Problem  Relation Age of Onset  . Heart attack Mother   . Diabetes Mother   . Heart attack Father   . Diabetes Sister     No Known Allergies   Review of Systems   Review of Systems: Negative Unless Checked Constitutional: [] Weight loss  [] Fever  [] Chills Cardiac: [] Chest pain   []  Atrial Fibrillation  [] Palpitations   [] Shortness of breath when laying flat   [] Shortness of breath with exertion. [] Shortness of breath at rest Vascular:  [] Pain in legs with walking   [] Pain in legs with standing [] Pain in legs when laying flat   [] Claudication    [] Pain in feet when laying flat    [] History of DVT   [] Phlebitis   [x] Swelling in legs   [] Varicose veins   [] Non-healing ulcers Pulmonary:   [] Uses home oxygen   [] Productive cough   [] Hemoptysis   [] Wheeze  [] COPD   [] Asthma Neurologic:  [] Dizziness   [] Seizures  [] Blackouts [] History of stroke   [] History of TIA  [] Aphasia   [] Temporary Blindness   [] Weakness or numbness in arm   [x] Weakness or numbness in leg Musculoskeletal:   [] Joint swelling   [] Joint pain   [] Low back pain  []  History of Knee Replacement [x] Arthritis [] back Surgeries  []  Spinal Stenosis    Hematologic:  [] Easy bruising  [] Easy bleeding   [] Hypercoagulable state   [] Anemic Gastrointestinal:  [] Diarrhea   [] Vomiting  [] Gastroesophageal reflux/heartburn   [] Difficulty swallowing. [] Abdominal pain Genitourinary:  [] Chronic kidney disease   [] Difficult urination  [] Anuric   [] Blood in urine [] Frequent urination  [] Burning with urination   [] Hematuria Skin:  [] Rashes   [] Ulcers [] Wounds Psychological:  [] History of anxiety   []  History of major depression  []  Memory Difficulties      OBJECTIVE:   Physical Exam  BP (!) 148/81 (BP Location: Right Arm, Patient Position: Sitting, Cuff Size: Large)   Pulse 78   Resp 12   Ht 5\' 11"  (1.803 m)   Wt 230 lb (104.3 kg)   BMI 32.08 kg/m   Gen: WD/WN, NAD Head: [place]/AT, No temporalis wasting.  Ear/Nose/Throat: Hearing grossly intact, nares  w/o erythema or drainage Eyes: PER, EOMI, sclera nonicteric.  Neck: Supple, no masses.  No JVD.  Pulmonary:  Good air movement, no use of accessory muscles.  Cardiac: RRR Vascular: Left lower extremity swelling greatly decreased, right lower extremity 3+ edema  Vessel Right Left  Radial Palpable Palpable   Gastrointestinal: soft, non-distended. No guarding/no peritoneal signs.  Musculoskeletal: M/S 5/5 throughout.  No deformity or atrophy.  Neurologic: Pain and light touch intact in extremities.  Symmetrical.  Speech is fluent. Motor exam as  listed above. Psychiatric: Judgment intact, Mood & affect appropriate for pt's clinical situation. Dermatologic: No Venous rashes. No Ulcers Noted.  No changes consistent with cellulitis. Lymph : No Cervical lymphadenopathy, no lichenification or skin changes of chronic lymphedema.       ASSESSMENT AND PLAN:  1. 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 Patient will continue  wearing graduated compression stockings class 1 on a daily basis, beginning first thing in the morning and removing them in the evening.    In addition, behavioral modification including elevation during the day was again discussed and this will continue.  The patient has utilized over the counter pain medications and has been exercising.  However, at this time conservative therapy has not alleviated the patient's symptoms of leg pain and swelling  Recommend: laser ablation of the right great saphenous veins to eliminate the symptoms of pain and swelling of the lower extremities caused by the severe superficial venous reflux disease.   2. Type 2 diabetes mellitus with complication (HCC) Continue hypoglycemic medications as already ordered, these medications have been reviewed and there are no changes at  this time.  Hgb A1C to be monitored as already arranged by primary service   3. Hyperlipidemia, unspecified hyperlipidemia type Continue statin as ordered and reviewed, no changes at this time   4. Lymphedema No surgery or intervention at this point in time.    I have reviewed my discussion with the patient regarding venous insufficiency and secondary lymph edema and why it  causes symptoms. I have discussed with the patient the chronic skin changes that accompany these problems and the long term sequela such as ulceration and infection.  Patient will continue wearing graduated compression stockings class 1 (20-30 mmHg) on a daily basis a prescription was given to the patient to keep this updated. The patient will  put the stockings on first thing in the morning and removing them in the evening. The patient is instructed specifically not to sleep in the stockings.  In addition, behavioral modification including elevation during the day will be continued.  Diet and salt restriction was also discussed.  We will work with home health to obtain compression wraps for the patient.      Current Outpatient Medications on File Prior to Visit  Medication Sig Dispense Refill  . ALPRAZolam (XANAX) 0.5 MG tablet Take 0.5 mg by mouth 2 (two) times daily. 1/2 tab, 1 PM    . atropine 1 % ophthalmic solution atropine 1 % eye drops    . carvedilol (COREG) 6.25 MG tablet Take 6.25 mg by mouth 2 (two) times daily with a meal.   10  . Cholecalciferol (VITAMIN D-3 PO) Take by mouth daily. 10 AM    . dorzolamide-timolol (COSOPT) 22.3-6.8 MG/ML ophthalmic solution dorzolamide 22.3 mg-timolol 6.8 mg/mL eye drops    . finasteride (PROSCAR) 5 MG tablet Take 1 tablet (5 mg total) by mouth daily. 30 tablet 11  . furosemide (LASIX) 20 MG tablet Take 20 mg by mouth daily.    Marland Kitchen gabapentin (NEURONTIN) 300 MG capsule Take 300 mg by mouth 2 (two) times daily. 1 PM, 11 PM    . latanoprost (XALATAN) 0.005 % ophthalmic  solution 1 drop at bedtime.    . metFORMIN (GLUCOPHAGE) 500 MG tablet Take 1,000 mg by mouth 2 (two) times  daily with a meal. 1 PM, 11 PM    . Multiple Vitamin (MULTIVITAMIN) capsule Take 1 capsule by mouth daily.    . nefazodone (SERZONE) 100 MG tablet Take by mouth.    Marland Kitchen omeprazole (PRILOSEC) 40 MG capsule   5  . Difluprednate (DUREZOL) 0.05 % EMUL     . doxycycline (VIBRAMYCIN) 100 MG capsule Take 1 capsule (100 mg total) by mouth 2 (two) times daily. (Patient not taking: Reported on 06/30/2018) 20 capsule 0  . ferrous sulfate 325 (65 FE) MG tablet Take 325 mg by mouth daily with breakfast.    . HYDROcodone-acetaminophen (NORCO/VICODIN) 5-325 MG per tablet Take 1 tablet by mouth every 6 (six) hours as needed for moderate pain. 1 PM, 11PM    . mirabegron ER (MYRBETRIQ) 25 MG TB24 tablet Take 1 tablet (25 mg total) by mouth daily for 28 days. 28 tablet 0  . potassium chloride (K-DUR) 10 MEQ tablet Take 10 mEq by mouth daily.    . sertraline (ZOLOFT) 100 MG tablet Take 100 mg by mouth daily. 6 PM    . simvastatin (ZOCOR) 10 MG tablet Take 10 mg by mouth daily. 10 AM    . sitaGLIPtin (JANUVIA) 100 MG tablet Take 100 mg by mouth daily.    . sucralfate (CARAFATE) 1 G tablet Take 1 g by mouth 2 (two) times daily. 10 AM, 11 PM    . tamsulosin (FLOMAX) 0.4 MG CAPS capsule Take 1 capsule (0.4 mg total) by mouth daily after breakfast. 1 PM 30 capsule 11  . telmisartan (MICARDIS) 40 MG tablet   5  . triamcinolone ointment (KENALOG) 0.1 % triamcinolone acetonide 0.1 % topical ointment    . verapamil (COVERA HS) 240 MG (CO) 24 hr tablet Take 240 mg by mouth daily. 6 PM    . Vitamin D, Ergocalciferol, (DRISDOL) 1.25 MG (50000 UT) CAPS capsule   5   No current facility-administered medications on file prior to visit.     There are no Patient Instructions on file for this visit. No follow-ups on file.   Georgiana Spinner, NP  This note was completed with Office manager.  Any errors are purely  unintentional.

## 2018-06-30 NOTE — Telephone Encounter (Signed)
I spoke with Crystal from [facility] and she informed that the patient is not under there care.The patient is currently under [facility] care

## 2018-07-15 NOTE — Telephone Encounter (Signed)
Marisue Ivan from [facility] left a voicemail stating the daughter wanted to know when his next laser is schedule on and I left a message informing the nurse that the patient will be contacted to schedule next laser

## 2018-07-17 ENCOUNTER — Telehealth (INDEPENDENT_AMBULATORY_CARE_PROVIDER_SITE_OTHER): Payer: Self-pay | Admitting: Nurse Practitioner

## 2018-07-17 NOTE — Telephone Encounter (Signed)
Could you find out who he was using for home health before.  I remember his family saying that he had HH before

## 2018-07-17 NOTE — Telephone Encounter (Signed)
Britney with [facility] homehealth called back stating that patient address is in [place] and that they do not service that area.

## 2018-07-18 NOTE — Telephone Encounter (Signed)
Patient uses [facility] home [facility]. Order for [employer] faxed over to [facility] this morning. I also called Clydie Braun to let her know the order was coming. AS, CMA

## 2018-11-25 ENCOUNTER — Ambulatory Visit (INDEPENDENT_AMBULATORY_CARE_PROVIDER_SITE_OTHER): Payer: Medicare Other | Admitting: Vascular Surgery

## 2018-11-25 ENCOUNTER — Other Ambulatory Visit: Payer: Self-pay

## 2018-11-25 ENCOUNTER — Encounter (INDEPENDENT_AMBULATORY_CARE_PROVIDER_SITE_OTHER): Payer: Self-pay

## 2018-11-25 ENCOUNTER — Encounter (INDEPENDENT_AMBULATORY_CARE_PROVIDER_SITE_OTHER): Payer: Self-pay | Admitting: Vascular Surgery

## 2018-11-25 VITALS — BP 152/82 | HR 89 | Resp 16 | Wt 235.0 lb

## 2018-11-25 DIAGNOSIS — E118 Type 2 diabetes mellitus with unspecified complications: Secondary | ICD-10-CM

## 2018-11-25 DIAGNOSIS — L97221 Non-pressure chronic ulcer of left calf limited to breakdown of skin: Secondary | ICD-10-CM

## 2018-11-25 DIAGNOSIS — I83022 Varicose veins of left lower extremity with ulcer of calf: Secondary | ICD-10-CM | POA: Diagnosis not present

## 2018-11-25 DIAGNOSIS — I890 Lymphedema, not elsewhere classified: Secondary | ICD-10-CM | POA: Diagnosis not present

## 2018-11-25 DIAGNOSIS — I10 Essential (primary) hypertension: Secondary | ICD-10-CM

## 2018-11-25 DIAGNOSIS — Z7984 Long term (current) use of oral hypoglycemic drugs: Secondary | ICD-10-CM

## 2018-11-25 DIAGNOSIS — E785 Hyperlipidemia, unspecified: Secondary | ICD-10-CM

## 2018-11-25 DIAGNOSIS — Z79899 Other long term (current) drug therapy: Secondary | ICD-10-CM

## 2018-11-25 NOTE — Progress Notes (Signed)
MRN : [id number]  Tim Henry is a 85 y.o. ([birth year]) male who presents with chief complaint of  Chief Complaint  Patient presents with  . Follow-up    discuss laser  .  History of Present Illness: Patient returns today in follow up of his venous insufficiency.  Prior to the COVID issues, he underwent left lower extremity great saphenous vein laser ablation with successful vein closure.  His previous ulcerations on the left leg have healed.  His swelling is better and his legs are actually doing okay.  He still has a lot of stasis changes which are quite severe bilaterally.  He is no longer requiring [place] boots.  His right leg is more bothered by his arthritic knee pain and it is the swelling in that leg although he does still have a little more swelling on the right than the we had discussed laser ablation of the right great saphenous vein but that got put on hold by the COVID epidemic.  He is here today for a follow-up visit.  No new ulceration or infection.  He is doing a pretty good job of elevating his legs and walking around in his walker.  Current Outpatient Medications  Medication Sig Dispense Refill  . ALPRAZolam (XANAX) 0.5 MG tablet Take 0.5 mg by mouth 2 (two) times daily. 1/2 tab, 1 PM    . atropine 1 % ophthalmic solution atropine 1 % eye drops    . carvedilol (COREG) 6.25 MG tablet Take 6.25 mg by mouth 2 (two) times daily with a meal.   10  . Cholecalciferol (VITAMIN D-3 PO) Take by mouth daily. 10 AM    . Difluprednate (DUREZOL) 0.05 % EMUL     . dorzolamide-timolol (COSOPT) 22.3-6.8 MG/ML ophthalmic solution dorzolamide 22.3 mg-timolol 6.8 mg/mL eye drops    . ferrous sulfate 325 (65 FE) MG tablet Take 325 mg by mouth daily with breakfast.    . finasteride (PROSCAR) 5 MG tablet Take 1 tablet (5 mg total) by mouth daily. 30 tablet 11  . furosemide (LASIX) 20 MG tablet Take 20 mg by mouth daily.    Marland Kitchen. gabapentin (NEURONTIN) 300 MG capsule Take 300 mg by mouth 2 (two)  times daily. 1 PM, 11 PM    . HYDROcodone-acetaminophen (NORCO/VICODIN) 5-325 MG per tablet Take 1 tablet by mouth every 6 (six) hours as needed for moderate pain. 1 PM, 11PM    . latanoprost (XALATAN) 0.005 % ophthalmic solution 1 drop at bedtime.    . metFORMIN (GLUCOPHAGE) 500 MG tablet Take 1,000 mg by mouth 2 (two) times daily with a meal. 1 PM, 11 PM    . Multiple Vitamin (MULTIVITAMIN) capsule Take 1 capsule by mouth daily.    . nefazodone (SERZONE) 100 MG tablet Take by mouth.    Marland Kitchen. omeprazole (PRILOSEC) 40 MG capsule   5  . potassium chloride (K-DUR) 10 MEQ tablet Take 10 mEq by mouth daily.    . sertraline (ZOLOFT) 100 MG tablet Take 100 mg by mouth daily. 6 PM    . simvastatin (ZOCOR) 10 MG tablet Take 10 mg by mouth daily. 10 AM    . sitaGLIPtin (JANUVIA) 100 MG tablet Take 100 mg by mouth daily.    . sucralfate (CARAFATE) 1 G tablet Take 1 g by mouth 2 (two) times daily. 10 AM, 11 PM    . tamsulosin (FLOMAX) 0.4 MG CAPS capsule Take 1 capsule (0.4 mg total) by mouth daily after breakfast. 1 PM 30  capsule 11  . telmisartan (MICARDIS) 40 MG tablet   5  . triamcinolone ointment (KENALOG) 0.1 % triamcinolone acetonide 0.1 % topical ointment    . verapamil (COVERA HS) 240 MG (CO) 24 hr tablet Take 240 mg by mouth daily. 6 PM    . Vitamin D, Ergocalciferol, (DRISDOL) 1.25 MG (50000 UT) CAPS capsule   5  . doxycycline (VIBRAMYCIN) 100 MG capsule Take 1 capsule (100 mg total) by mouth 2 (two) times daily. (Patient not taking: Reported on 06/30/2018) 20 capsule 0  . mirabegron ER (MYRBETRIQ) 25 MG TB24 tablet Take 1 tablet (25 mg total) by mouth daily for 28 days. 28 tablet 0   No current facility-administered medications for this visit.     Past Medical History:  Diagnosis Date  . Anemia    in past  . Anxiety   . Dementia ([place])    able to sign own papers  . Depression   . Diabetes mellitus without complication ([place])   . Full dentures    upper and lower  . GERD (gastroesophageal  reflux disease)   . Head trauma    admitted to [facility], "pint of blood removed from head", no deficits after  . Hypercholesteremia   . Hypertension   . Seizure after head injury ([place])    none since release from hosp    Past Surgical History:  Procedure Laterality Date  . APPENDECTOMY     childhood  . [place]  . CATARACT EXTRACTION W/PHACO Left 09/15/2014   Procedure: CATARACT EXTRACTION PHACO AND INTRAOCULAR LENS PLACEMENT (IOC);  Surgeon: Leandrew Koyanagi, MD;  Location: [place];  Service: Ophthalmology;  Laterality: Left;  DIABETIC PT WOULD LIKE TO HAVE ARRIVAL TIME LATE AM  . JOINT REPLACEMENT Left    knee  . PHOTOCOAGULATION WITH LASER Right 01/23/2016   Procedure: PHOTOCOAGULATION WITH LASER;  Surgeon: Ronnell Freshwater, MD;  Location: [place];  Service: Ophthalmology;  Laterality: Right;  DIABETIC - oral meds RIGHT Requests arrival 10AM or after   Social History        Tobacco Use  . Smoking status: Former Smoker    Last attempt to quit: 04/30/1966    Years since quitting: 51.7  . Smokeless tobacco: Never Used  Substance Use Topics  . Alcohol use: No  . Drug use: No     Family History      Family History  Problem Relation Age of Onset  . Heart attack Mother   . Diabetes Mother   . Heart attack Father   . Diabetes Sister      No Known Allergies   REVIEW OF SYSTEMS(Negative unless checked)  Constitutional: [] ?Weight loss [] ?Fever [] ?Chills Cardiac: [] ?Chest pain [] ?Chest pressure [] ?Palpitations [] ?Shortness of breath when laying flat [] ?Shortness of breath at rest [] ?Shortness of breath with exertion. Vascular: [] ?Pain in legs with walking [] ?Pain in legs at rest [] ?Pain in legs when laying flat [] ?Claudication [] ?Pain in feet when walking [] ?Pain in feet at rest [] ?Pain in feet when laying flat [] ?History of DVT [] ?Phlebitis [x] ?Swelling in legs [] ?Varicose  veins [x] ?Non-healing ulcers Pulmonary: [] ?Uses home oxygen [] ?Productive cough [] ?Hemoptysis [] ?Wheeze [] ?COPD [] ?Asthma Neurologic: [] ?Dizziness [] ?Blackouts [] ?Seizures [] ?History of stroke [] ?History of TIA [] ?Aphasia [] ?Temporary blindness [] ?Dysphagia [] ?Weakness or numbness in arms [] ?Weakness or numbness in legs Musculoskeletal: [x] ?Arthritis [] ?Joint swelling [] ?Joint pain [] ?Low back pain Hematologic: [] ?Easy bruising [] ?Easy bleeding [] ?Hypercoagulable state [] ?Anemic  Gastrointestinal: [] ?Blood in stool [] ?Vomiting blood [] ?Gastroesophageal reflux/heartburn [] ?Abdominal pain Genitourinary: [x] ?Chronic kidney disease [] ?Difficult urination [] ?  Frequent urination [] ?Burning with urination [] ?Hematuria Skin: [] ?Rashes [x] ?Ulcers [x] ?Wounds Psychological: [] ?History of anxiety [] ?History of major depression.    Physical Examination  BP (!) 152/82 (BP Location: Right Arm)   Pulse 89   Resp 16   Wt 235 lb (106.6 kg)   BMI 32.78 kg/m  Gen:  WD/WN, NAD Head: [place]/AT, No temporalis wasting. Ear/Nose/Throat: Hearing grossly intact, nares w/o erythema or drainage Eyes: Conjunctiva clear. Sclera non-icteric Neck: Supple.  Trachea midline Pulmonary:  Good air movement, no use of accessory muscles.  Cardiac: Somewhat irregular Vascular:  Vessel Right Left  Radial Palpable Palpable                       Musculoskeletal: M/S 5/5 throughout.  No deformity or atrophy.  Severe stasis dermatitis changes present bilaterally.  1-2+ right lower extremity edema, 1+ left lower extremity edema.  Walks with a walker Neurologic: Sensation grossly intact in extremities.  Symmetrical.  Speech is fluent.  Psychiatric: Judgment intact, Mood & affect appropriate for pt's clinical situation. Dermatologic: No rashes or ulcers noted.  No cellulitis or open wounds.       Labs No results found for this or any previous visit (from  the past 2160 hour(s)).  Radiology No results found.  Assessment/Plan Essential hypertension blood pressure control important in reducing the progression of atherosclerotic disease. On appropriate oral medications.   Type 2 diabetes mellitus with complication (HCC) blood glucose control important in reducing the progression of atherosclerotic disease. Also, involved in wound healing. On appropriate medications.   Hyperlipidemia lipid control important in reducing the progression of atherosclerotic disease. Continue statin therapy  Lymphedema From chronic scarring of lymphatic channels. Compression and elevation.   Varicose veins of lower extremities with ulcer (HCC) Previous ulcers have healed.  Left leg great saphenous vein laser ablation seems to have helped although he still has marked stasis changes bilaterally.  At this point, with symptoms reasonably well-controlled and no infection or ulceration I think it would be prudent to hold on laser ablation of the right great saphenous vein.  Given his advanced age even a minor procedure could have risks.  At this point, we will plan to see him back in 3 to 4 months in follow-up.    Festus BarrenJason , MD  11/25/2018 2:26 PM    This note was created with Dragon medical transcription system.  Any errors from dictation are purely unintentional

## 2018-11-25 NOTE — Assessment & Plan Note (Signed)
Previous ulcers have healed.  Left leg great saphenous vein laser ablation seems to have helped although he still has marked stasis changes bilaterally.  At this point, with symptoms reasonably well-controlled and no infection or ulceration I think it would be prudent to hold on laser ablation of the right great saphenous vein.  Given his advanced age even a minor procedure could have risks.  At this point, we will plan to see him back in 3 to 4 months in follow-up.

## 2018-11-25 NOTE — Patient Instructions (Signed)

## 2018-12-22 DIAGNOSIS — L97309 Non-pressure chronic ulcer of unspecified ankle with unspecified severity: Secondary | ICD-10-CM | POA: Insufficient documentation

## 2019-01-14 ENCOUNTER — Other Ambulatory Visit: Payer: Self-pay

## 2019-01-14 DIAGNOSIS — N401 Enlarged prostate with lower urinary tract symptoms: Secondary | ICD-10-CM

## 2019-01-14 MED ORDER — FINASTERIDE 5 MG PO TABS: 5.0000 mg | ORAL_TABLET | Freq: Every day | ORAL | 3 refills | Status: DC

## 2019-03-03 ENCOUNTER — Ambulatory Visit (INDEPENDENT_AMBULATORY_CARE_PROVIDER_SITE_OTHER): Payer: Medicare Other | Admitting: Vascular Surgery

## 2019-03-17 ENCOUNTER — Encounter (INDEPENDENT_AMBULATORY_CARE_PROVIDER_SITE_OTHER): Payer: Self-pay

## 2019-03-17 ENCOUNTER — Ambulatory Visit (INDEPENDENT_AMBULATORY_CARE_PROVIDER_SITE_OTHER): Payer: Medicare Other | Admitting: Vascular Surgery

## 2019-03-17 ENCOUNTER — Encounter (INDEPENDENT_AMBULATORY_CARE_PROVIDER_SITE_OTHER): Payer: Self-pay | Admitting: Vascular Surgery

## 2019-03-17 ENCOUNTER — Other Ambulatory Visit: Payer: Self-pay

## 2019-03-17 VITALS — BP 143/77 | HR 80 | Resp 16 | Ht 71.0 in | Wt 238.0 lb

## 2019-03-17 DIAGNOSIS — L97221 Non-pressure chronic ulcer of left calf limited to breakdown of skin: Secondary | ICD-10-CM

## 2019-03-17 DIAGNOSIS — I83022 Varicose veins of left lower extremity with ulcer of calf: Secondary | ICD-10-CM

## 2019-03-17 DIAGNOSIS — E785 Hyperlipidemia, unspecified: Secondary | ICD-10-CM

## 2019-03-17 DIAGNOSIS — I10 Essential (primary) hypertension: Secondary | ICD-10-CM

## 2019-03-17 DIAGNOSIS — I890 Lymphedema, not elsewhere classified: Secondary | ICD-10-CM | POA: Diagnosis not present

## 2019-03-17 DIAGNOSIS — E118 Type 2 diabetes mellitus with unspecified complications: Secondary | ICD-10-CM

## 2019-03-17 NOTE — Assessment & Plan Note (Signed)
No recurrent ulceration.  Leg swelling has been fairly stable.  We are going to continue with conservative therapies and plan no intervention at current unless his symptoms worsen.  Return to clinic in 6 months.

## 2019-03-17 NOTE — Progress Notes (Signed)
MRN : [id number]  Tim Henry is a 85 y.o. (1928/02/14) male who presents with chief complaint of  Chief Complaint  Patient presents with  . Follow-up    3-4 month f/u  .  History of Present Illness: Patient returns today in follow up of his leg swelling and venous insufficiency.  Since his last visit several months ago, his leg swelling really has not dramatically changed.  It is about the same as it was then.  No new ulceration or infection.  He has a previous history of longstanding ulceration.  Back at the beginning of Covid, we were considering right saphenous vein laser ablation.  This was put on hold with Covid and his symptoms have stabilized we have not proceeded with any intervention.  Current Outpatient Medications  Medication Sig Dispense Refill  . ALPRAZolam (XANAX) 0.5 MG tablet Take 0.5 mg by mouth 2 (two) times daily. 1/2 tab, 1 PM    . atropine 1 % ophthalmic solution atropine 1 % eye drops    . carvedilol (COREG) 6.25 MG tablet Take 6.25 mg by mouth 2 (two) times daily with a meal.   10  . Cholecalciferol (VITAMIN D-3 PO) Take by mouth daily. 10 AM    . Difluprednate (DUREZOL) 0.05 % EMUL     . dorzolamide-timolol (COSOPT) 22.3-6.8 MG/ML ophthalmic solution dorzolamide 22.3 mg-timolol 6.8 mg/mL eye drops    . doxycycline (VIBRAMYCIN) 100 MG capsule Take 1 capsule (100 mg total) by mouth 2 (two) times daily. 20 capsule 0  . ferrous sulfate 325 (65 FE) MG tablet Take 325 mg by mouth daily with breakfast.    . finasteride (PROSCAR) 5 MG tablet Take 1 tablet (5 mg total) by mouth daily. 30 tablet 3  . furosemide (LASIX) 20 MG tablet Take 20 mg by mouth daily.    Marland Kitchen. gabapentin (NEURONTIN) 300 MG capsule Take 300 mg by mouth 2 (two) times daily. 1 PM, 11 PM    . HYDROcodone-acetaminophen (NORCO/VICODIN) 5-325 MG per tablet Take 1 tablet by mouth every 6 (six) hours as needed for moderate pain. 1 PM, 11PM    . latanoprost (XALATAN) 0.005 % ophthalmic solution 1 drop at  bedtime.    . metFORMIN (GLUCOPHAGE) 500 MG tablet Take 1,000 mg by mouth 2 (two) times daily with a meal. 1 PM, 11 PM    . Multiple Vitamin (MULTIVITAMIN) capsule Take 1 capsule by mouth daily.    . nefazodone (SERZONE) 100 MG tablet Take by mouth.    Marland Kitchen. omeprazole (PRILOSEC) 40 MG capsule   5  . potassium chloride (K-DUR) 10 MEQ tablet Take 10 mEq by mouth daily.    . sertraline (ZOLOFT) 100 MG tablet Take 100 mg by mouth daily. 6 PM    . simvastatin (ZOCOR) 10 MG tablet Take 10 mg by mouth daily. 10 AM    . sitaGLIPtin (JANUVIA) 100 MG tablet Take 100 mg by mouth daily.    . sucralfate (CARAFATE) 1 G tablet Take 1 g by mouth 2 (two) times daily. 10 AM, 11 PM    . tamsulosin (FLOMAX) 0.4 MG CAPS capsule Take 1 capsule (0.4 mg total) by mouth daily after breakfast. 1 PM 30 capsule 11  . telmisartan (MICARDIS) 40 MG tablet   5  . triamcinolone ointment (KENALOG) 0.1 % triamcinolone acetonide 0.1 % topical ointment    . verapamil (COVERA HS) 240 MG (CO) 24 hr tablet Take 240 mg by mouth daily. 6 PM    .  Vitamin D, Ergocalciferol, (DRISDOL) 1.25 MG (50000 UT) CAPS capsule   5  . mirabegron ER (MYRBETRIQ) 25 MG TB24 tablet Take 1 tablet (25 mg total) by mouth daily for 28 days. 28 tablet 0   No current facility-administered medications for this visit.     Past Medical History:  Diagnosis Date  . Anemia    in past  . Anxiety   . Dementia (HCC)    able to sign own papers  . Depression   . Diabetes mellitus without complication (HCC)   . Full dentures    upper and lower  . GERD (gastroesophageal reflux disease)   . Head trauma    admitted to [facility], "pint of blood removed from head", no deficits after  . Hypercholesteremia   . Hypertension   . 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 APPENDECTOMY     childhood  . BACK SURGERY  1963  . CATARACT EXTRACTION W/PHACO Left 09/15/2014   Procedure: CATARACT  EXTRACTION PHACO AND INTRAOCULAR LENS PLACEMENT (IOC);  Surgeon: Lockie Mola, MD;  Location: [facility];  Service: Ophthalmology;  Laterality: Left;  DIABETIC PT WOULD LIKE TO HAVE ARRIVAL TIME LATE AM  . JOINT REPLACEMENT Left    knee  . PHOTOCOAGULATION WITH LASER Right 01/23/2016   Procedure: PHOTOCOAGULATION WITH LASER;  Surgeon: Sherald Hess, MD;  Location: [facility];  Service: Ophthalmology;  Laterality: Right;  DIABETIC - oral meds RIGHT Requests arrival 10AM or after   Social History        Tobacco Use  . Smoking status: Former Smoker    Last attempt to quit: 04/30/1966    Years since quitting: 51.7  . Smokeless tobacco: Never Used  Substance Use Topics  . Alcohol use: No  . Drug use: No     Family History      Family History  Problem Relation Age of Onset  . Heart attack Mother   . Diabetes Mother   . Heart attack Father   . Diabetes Sister      No Known Allergies   REVIEW OF SYSTEMS(Negative unless checked)  Constitutional: [] ??Weight loss [] ??Fever [] ??Chills Cardiac: [] ??Chest pain [] ??Chest pressure [] ??Palpitations [] ??Shortness of breath when laying flat [] ??Shortness of breath at rest [] ??Shortness of breath with exertion. Vascular: [] ??Pain in legs with walking [] ??Pain in legs at rest [] ??Pain in legs when laying flat [] ??Claudication [] ??Pain in feet when walking [] ??Pain in feet at rest [] ??Pain in feet when laying flat [] ??History of DVT [] ??Phlebitis [x] ??Swelling in legs [] ??Varicose veins [x] ??Non-healing ulcers Pulmonary: [] ??Uses home oxygen [] ??Productive cough [] ??Hemoptysis [] ??Wheeze [] ??COPD [] ??Asthma Neurologic: [] ??Dizziness [] ??Blackouts [] ??Seizures [] ??History of stroke [] ??History of TIA [] ??Aphasia [] ??Temporary blindness [] ??Dysphagia [] ??Weakness or numbness in arms [] ??Weakness or numbness in legs  Musculoskeletal: [x] ??Arthritis [] ??Joint swelling [] ??Joint pain [] ??Low back pain Hematologic: [] ??Easy bruising [] ??Easy bleeding [] ??Hypercoagulable state [] ??Anemic  Gastrointestinal: [] ??Blood in stool [] ??Vomiting blood [] ??Gastroesophageal reflux/heartburn [] ??Abdominal pain Genitourinary: [x] ??Chronic kidney disease [] ??Difficult urination [] ??Frequent urination [] ??Burning with urination [] ??Hematuria Skin: [] ??Rashes [x] ??Ulcers [x] ??Wounds Psychological: [] ??History of anxiety [] ??History of major depression.     Physical Examination  BP (!) 143/77 (BP Location: Right Arm)   Pulse 80   Resp 16   Ht 5\' 11"  (1.803 m)   Wt 238 lb (108 kg)   BMI 33.19 kg/m  Gen:  WD/WN, NAD.  Appears younger than stated age Head: [place]/AT, No temporalis wasting. Ear/Nose/Throat: Hearing grossly  intact, nares w/o erythema or drainage Eyes: Conjunctiva clear. Sclera non-icteric Neck: Supple.  Trachea midline Pulmonary:  Good air movement, no use of accessory muscles.  Cardiac: RRR, no JVD Vascular:  Vessel Right Left  Radial Palpable Palpable                          PT  trace palpable  trace palpable  DP  trace palpable  1+ palpable   Gastrointestinal: soft, non-tender/non-distended. No guarding/reflex.  Musculoskeletal: M/S 5/5 throughout.  No deformity or atrophy.  Marked stasis changes are present throughout both lower legs.  1+ bilateral lower extremity edema. Neurologic: Sensation grossly intact in extremities.  Symmetrical.  Speech is fluent.  Psychiatric: Judgment intact, Mood & affect appropriate for pt's clinical situation. Dermatologic: No rashes or ulcers noted.  No cellulitis or open wounds.      Labs No results found for this or any previous visit (from the past 2160 hour(s)).  Radiology No results found.  Assessment/Plan Essential hypertension blood pressure control important in reducing the progression of atherosclerotic  disease. On appropriate oral medications.   Type 2 diabetes mellitus with complication (HCC) blood glucose control important in reducing the progression of atherosclerotic disease. Also, involved in wound healing. On appropriate medications.   Hyperlipidemia lipid control important in reducing the progression of atherosclerotic disease. Continue statin therapy  Lymphedema From chronic scarring of lymphatic channels. Compression and elevation.  Varicose veins of lower extremities with ulcer ([place]) No recurrent ulceration.  Leg swelling has been fairly stable.  We are going to continue with conservative therapies and plan no intervention at current unless his symptoms worsen.  Return to clinic in 6 months.    Leotis Pain, MD  03/17/2019 2:04 PM    This note was created with Dragon medical transcription system.  Any errors from dictation are purely unintentional

## 2019-04-16 ENCOUNTER — Other Ambulatory Visit: Payer: Self-pay

## 2019-04-16 ENCOUNTER — Ambulatory Visit: Payer: Medicare Other | Admitting: Urology

## 2019-04-16 ENCOUNTER — Encounter: Payer: Self-pay | Admitting: Urology

## 2019-04-16 DIAGNOSIS — R350 Frequency of micturition: Secondary | ICD-10-CM | POA: Diagnosis not present

## 2019-04-16 DIAGNOSIS — N401 Enlarged prostate with lower urinary tract symptoms: Secondary | ICD-10-CM

## 2019-04-16 MED ORDER — TAMSULOSIN HCL 0.4 MG PO CAPS: 0.4000 mg | ORAL_CAPSULE | Freq: Every day | ORAL | 11 refills | Status: DC

## 2019-04-16 MED ORDER — FINASTERIDE 5 MG PO TABS: 5.0000 mg | ORAL_TABLET | Freq: Every day | ORAL | 11 refills | Status: DC

## 2019-04-16 NOTE — Progress Notes (Signed)
04/16/2019 3:00 PM   Tim Henry C Henry Tim 26, 1929 [id number]  Referring provider: Aniceto BossSettle, Paul C, MD [address] [place],  [place] [id number]  Chief Complaint  Patient presents with  . Benign Prostatic Hypertrophy    Urologic history: 1.  BPH with lower urinary tract symptoms             -Followed since 2015; initially treated with tamsulosin with significant improvement in his symptoms             -Finasteride added for worsening LUTS  2.  Microhematuria evaluation September 2017             -Renal ultrasound bilateral simple cysts             -Cystoscopy with trilobar enlargement/prominent hypervascularity   HPI: 85 y.o. male presents for annual follow-up.  He was given a trial of Myrbetriq last year for urge related symptoms.  He took for 1 month but does not remember efficacy.  Overall he states his voiding patterns are stable.  He remains on finasteride and tamsulosin.  He requested a DRE today.   PMH: Past Medical History:  Diagnosis Date  . Anemia    in past  . Anxiety   . Dementia (HCC)    able to sign own papers  . Depression   . Diabetes mellitus without complication (HCC)   . Full dentures    upper and lower  . GERD (gastroesophageal reflux disease)   . Head trauma    [facility], "pint of blood removed from head", no deficits after  . Hypercholesteremia   . Hypertension   . 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 APPENDECTOMY     childhood  . BACK SURGERY  1963  . CATARACT EXTRACTION W/PHACO Left 09/15/2014   Procedure: CATARACT EXTRACTION PHACO AND INTRAOCULAR LENS PLACEMENT (IOC);  Surgeon: Lockie Molahadwick Brasington, MD;  [facility];  Service: Ophthalmology;  Laterality: Left;  DIABETIC PT WOULD LIKE TO HAVE ARRIVAL TIME LATE AM  . JOINT REPLACEMENT Left    knee  . PHOTOCOAGULATION WITH LASER Right 01/23/2016   Procedure:  PHOTOCOAGULATION WITH LASER;  Surgeon: Sherald HessAnita Prakash Vin-Parikh, MD;  [facility];  Service: Ophthalmology;  Laterality: Right;  DIABETIC - oral meds RIGHT Requests arrival 10AM or after    Home Medications:  Allergies as of 04/16/2019   No Known Allergies     Medication List       Accurate as of April 16, 2019  3:00 PM. If you have any questions, ask your nurse or doctor.        STOP taking these medications   doxycycline 100 MG capsule Commonly known as: VIBRAMYCIN Stopped by: [name], MD     TAKE these medications   ALPRAZolam 0.5 MG tablet Commonly known as: XANAX Take 0.5 mg by mouth 2 (two) times daily. 1/2 tab, 1 PM   atropine 1 % ophthalmic solution atropine 1 % eye drops   carvedilol 6.25 MG tablet Commonly known as: COREG Take 6.25 mg by mouth 2 (two) times daily with a meal.   dorzolamide-timolol 22.3-6.8 MG/ML ophthalmic solution Commonly known as: COSOPT dorzolamide 22.3 mg-timolol 6.8 mg/mL eye drops   Durezol 0.05 % Emul Generic drug: Difluprednate   ferrous sulfate 325 (65 FE) MG tablet Take 325 mg by mouth daily with breakfast.   finasteride  5 MG tablet Commonly known as: PROSCAR Take 1 tablet (5 mg total) by mouth daily.   furosemide 20 MG tablet Commonly known as: LASIX Take 20 mg by mouth daily.   gabapentin 300 MG capsule Commonly known as: NEURONTIN Take 300 mg by mouth 2 (two) times daily. 1 PM, 11 PM   HYDROcodone-acetaminophen 5-325 MG tablet Commonly known as: NORCO/VICODIN Take 1 tablet by mouth every 6 (six) hours as needed for moderate pain. 1 PM, 11PM   Influenza Virus Vacc Split PF 0.5 ML Susy Afluria 2014-2015(PF) 45 mcg (15 mcg x 3)/0.5 mL intramuscular syringe   latanoprost 0.005 % ophthalmic solution Commonly known as: XALATAN 1 drop at bedtime.   metFORMIN 500 MG tablet Commonly known as: GLUCOPHAGE Take 1,000 mg by mouth 2 (two) times daily with a meal. 1 PM, 11 PM   mirabegron ER  25 MG Tb24 tablet Commonly known as: MYRBETRIQ Take 1 tablet (25 mg total) by mouth daily for 28 days.   multivitamin capsule Take 1 capsule by mouth daily.   nefazodone 100 MG tablet Commonly known as: SERZONE Take by mouth.   omeprazole 40 MG capsule Commonly known as: PRILOSEC   potassium chloride 10 MEQ tablet Commonly known as: KLOR-CON Take 10 mEq by mouth daily.   sertraline 100 MG tablet Commonly known as: ZOLOFT Take 100 mg by mouth daily. 6 PM   simvastatin 10 MG tablet Commonly known as: ZOCOR Take 10 mg by mouth daily. 10 AM   sitaGLIPtin 100 MG tablet Commonly known as: JANUVIA Take 100 mg by mouth daily.   sucralfate 1 g tablet Commonly known as: CARAFATE Take 1 g by mouth 2 (two) times daily. 10 AM, 11 PM   tamsulosin 0.4 MG Caps capsule Commonly known as: FLOMAX Take 1 capsule (0.4 mg total) by mouth daily after breakfast. 1 PM   telmisartan 40 MG tablet Commonly known as: MICARDIS   triamcinolone ointment 0.1 % Commonly known as: KENALOG triamcinolone acetonide 0.1 % topical ointment   verapamil 240 MG (CO) 24 hr tablet Commonly known as: COVERA HS Take 240 mg by mouth daily. 6 PM   Vitamin D (Ergocalciferol) 1.25 MG (50000 UT) Caps capsule Commonly known as: DRISDOL   VITAMIN D-3 PO Take by mouth daily. 10 AM       Allergies: No Known Allergies  Family History: Family History  Problem Relation Age of Onset  . Heart attack Mother   . Diabetes Mother   . Heart attack Father   . Diabetes Sister     Social History:  reports that he quit smoking about 52 years ago. He has never used smokeless tobacco. He reports that he does not drink alcohol or use drugs.  ROS: UROLOGY Frequent Urination?: Yes Hard to postpone urination?: No Burning/pain with urination?: No Get up at night to urinate?: Yes Leakage of urine?: No Urine stream starts and stops?: No Trouble starting stream?: No Do you have to strain to urinate?: No Blood in  urine?: No Urinary tract infection?: No Sexually transmitted disease?: No Injury to kidneys or bladder?: No Painful intercourse?: No Weak stream?: Yes Erection problems?: No Penile pain?: No  Gastrointestinal Nausea?: No Vomiting?: No Indigestion/heartburn?: No Diarrhea?: No Constipation?: No  Constitutional Fever: No Night sweats?: No Weight loss?: No Fatigue?: No  Skin Skin rash/lesions?: No Itching?: No  Eyes Blurred vision?: No Double vision?: No  Ears/Nose/Throat Sore throat?: No Sinus problems?: No  Hematologic/Lymphatic Swollen glands?: No Easy bruising?: No  Cardiovascular Leg  swelling?: Yes Chest pain?: No  Respiratory Cough?: No Shortness of breath?: No  Endocrine Excessive thirst?: No  Musculoskeletal Back pain?: Yes Joint pain?: Yes  Neurological Headaches?: Yes Dizziness?: Yes  Psychologic Depression?: Yes Anxiety?: No  Physical Exam: BP 131/74 (BP Location: Left Arm, Patient Position: Sitting, Cuff Size: Large)   Pulse 88   Ht 5\' 11"  (1.803 m)   Wt 238 lb (108 kg)   BMI 33.19 kg/m   Constitutional:  Alert and oriented, No acute distress. HEENT: [place] AT, moist mucus membranes.  Trachea midline, no masses. Cardiovascular: No clubbing, cyanosis, or edema. Respiratory: Normal respiratory effort, no increased work of breathing. GU: Prostate 40 g, smooth without nodules.  Moderate left hydrocele. Lymph: No inguinal lymphadenopathy. Skin: No rashes, bruises or suspicious lesions. Neurologic: Grossly intact, no focal deficits, moving all 4 extremities. Psychiatric: Normal mood and affect.   Assessment & Plan:    - BPH with lower urinary tract symptoms Mild to moderate voiding symptoms which are stable on combination therapy.  Tamsulosin and finasteride were refilled.  Continue annual follow-up and instructed to call earlier for worsening symptoms.   Abbie Sons, [place] [address],  [place],  [address] [phone]

## 2019-09-15 ENCOUNTER — Ambulatory Visit (INDEPENDENT_AMBULATORY_CARE_PROVIDER_SITE_OTHER): Payer: Medicare Other | Admitting: Vascular Surgery

## 2019-09-22 ENCOUNTER — Encounter (INDEPENDENT_AMBULATORY_CARE_PROVIDER_SITE_OTHER): Payer: Self-pay | Admitting: Vascular Surgery

## 2019-09-22 ENCOUNTER — Ambulatory Visit (INDEPENDENT_AMBULATORY_CARE_PROVIDER_SITE_OTHER): Payer: Medicare PPO | Admitting: Vascular Surgery

## 2019-09-22 ENCOUNTER — Other Ambulatory Visit: Payer: Self-pay

## 2019-09-22 VITALS — BP 137/70 | HR 86 | Resp 16 | Wt 234.0 lb

## 2019-09-22 DIAGNOSIS — I10 Essential (primary) hypertension: Secondary | ICD-10-CM | POA: Diagnosis not present

## 2019-09-22 DIAGNOSIS — E118 Type 2 diabetes mellitus with unspecified complications: Secondary | ICD-10-CM

## 2019-09-22 DIAGNOSIS — L97221 Non-pressure chronic ulcer of left calf limited to breakdown of skin: Secondary | ICD-10-CM

## 2019-09-22 DIAGNOSIS — I83022 Varicose veins of left lower extremity with ulcer of calf: Secondary | ICD-10-CM

## 2019-09-22 DIAGNOSIS — E785 Hyperlipidemia, unspecified: Secondary | ICD-10-CM

## 2019-09-22 DIAGNOSIS — I890 Lymphedema, not elsewhere classified: Secondary | ICD-10-CM

## 2019-09-22 NOTE — Assessment & Plan Note (Signed)
Symptoms are currently well controlled with conservative therapies.  No changes.  Recheck in 6 months.

## 2019-09-22 NOTE — Progress Notes (Signed)
MRN : [id number]  Tim Henry is a 86 y.o. (1927-06-08) male who presents with chief complaint of  Chief Complaint  Patient presents with  . Follow-up    39month follow up  .  History of Present Illness: Patient returns today in follow up of his lymphedema and venous disease.  He is doing well today.  His family is doing a good job of making sure he keeps moisturizers on his legs and elevate his legs.  No new ulceration or infection.  The swelling is under pretty good control.  Current Outpatient Medications  Medication Sig Dispense Refill  . atropine 1 % ophthalmic solution atropine 1 % eye drops    . carvedilol (COREG) 6.25 MG tablet Take 6.25 mg by mouth 2 (two) times daily with a meal.   10  . Cholecalciferol (VITAMIN D-3 PO) Take by mouth daily. 10 AM    . Difluprednate (DUREZOL) 0.05 % EMUL     . dorzolamide-timolol (COSOPT) 22.3-6.8 MG/ML ophthalmic solution dorzolamide 22.3 mg-timolol 6.8 mg/mL eye drops    . ferrous sulfate 325 (65 FE) MG tablet Take 325 mg by mouth daily with breakfast.    . finasteride (PROSCAR) 5 MG tablet Take 1 tablet (5 mg total) by mouth daily. 30 tablet 11  . furosemide (LASIX) 20 MG tablet Take 20 mg by mouth daily.    Marland Kitchen gabapentin (NEURONTIN) 300 MG capsule Take 300 mg by mouth 2 (two) times daily. 1 PM, 11 PM    . HYDROcodone-acetaminophen (NORCO/VICODIN) 5-325 MG per tablet Take 1 tablet by mouth every 6 (six) hours as needed for moderate pain. 1 PM, 11PM    . Influenza Virus Vacc Split PF 0.5 ML SUSY Afluria 2014-2015(PF) 45 mcg (15 mcg x 3)/0.5 mL intramuscular syringe    . latanoprost (XALATAN) 0.005 % ophthalmic solution 1 drop at bedtime.    . metFORMIN (GLUCOPHAGE) 500 MG tablet Take 1,000 mg by mouth 2 (two) times daily with a meal. 1 PM, 11 PM    . Multiple Vitamin (MULTIVITAMIN) capsule Take 1 capsule by mouth daily.    . nefazodone (SERZONE) 100 MG tablet Take by mouth.    Marland Kitchen omeprazole (PRILOSEC) 40 MG capsule   5  . potassium  chloride (K-DUR) 10 MEQ tablet Take 10 mEq by mouth daily.    . sertraline (ZOLOFT) 100 MG tablet Take 100 mg by mouth daily. 6 PM    . simvastatin (ZOCOR) 10 MG tablet Take 10 mg by mouth daily. 10 AM    . sitaGLIPtin (JANUVIA) 100 MG tablet Take 100 mg by mouth daily.    . sucralfate (CARAFATE) 1 G tablet Take 1 g by mouth 2 (two) times daily. 10 AM, 11 PM    . tamsulosin (FLOMAX) 0.4 MG CAPS capsule Take 1 capsule (0.4 mg total) by mouth daily after breakfast. 1 PM 30 capsule 11  . telmisartan (MICARDIS) 40 MG tablet   5  . triamcinolone ointment (KENALOG) 0.1 % triamcinolone acetonide 0.1 % topical ointment    . verapamil (COVERA HS) 240 MG (CO) 24 hr tablet Take 240 mg by mouth daily. 6 PM    . Vitamin D, Ergocalciferol, (DRISDOL) 1.25 MG (50000 UT) CAPS capsule   5  . ALPRAZolam (XANAX) 0.5 MG tablet Take 0.5 mg by mouth 2 (two) times daily. 1/2 tab, 1 PM     No current facility-administered medications for this visit.    Past Medical History:  Diagnosis Date  . Anemia  in past  . Anxiety   . Dementia ([place])    able to sign own papers  . Depression   . Diabetes mellitus without complication ([place])   . Full dentures    upper and lower  . GERD (gastroesophageal reflux disease)   . Head trauma    admitted to [facility], "pint of blood removed from head", no deficits after  . Hypercholesteremia   . Hypertension   . Seizure after head injury ([place])    none since release from hosp    Past Surgical History:  Procedure Laterality Date  . APPENDECTOMY     childhood  . [place]  . CATARACT EXTRACTION W/PHACO Left 09/15/2014   Procedure: CATARACT EXTRACTION PHACO AND INTRAOCULAR LENS PLACEMENT (IOC);  Surgeon: Leandrew Koyanagi, MD;  Location: [place];  Service: Ophthalmology;  Laterality: Left;  DIABETIC PT WOULD LIKE TO HAVE ARRIVAL TIME LATE AM  . JOINT REPLACEMENT Left    knee  . PHOTOCOAGULATION WITH LASER Right 01/23/2016   Procedure: PHOTOCOAGULATION WITH  LASER;  Surgeon: Ronnell Freshwater, MD;  Location: [place];  Service: Ophthalmology;  Laterality: Right;  DIABETIC - oral meds RIGHT Requests arrival 10AM or after     Social History   Tobacco Use  . Smoking status: Former Smoker    Quit date: 04/30/1966    Years since quitting: 53.4  . Smokeless tobacco: Never Used  Substance Use Topics  . Alcohol use: No  . Drug use: No      Family History  Problem Relation Age of Onset  . Heart attack Mother   . Diabetes Mother   . Heart attack Father   . Diabetes Sister      No Known Allergies  REVIEW OF SYSTEMS(Negative unless checked)  Constitutional: [] ???Weight loss [] ???Fever [] ???Chills Cardiac: [] ???Chest pain [] ???Chest pressure [] ???Palpitations [] ???Shortness of breath when laying flat [] ???Shortness of breath at rest [] ???Shortness of breath with exertion. Vascular: [] ???Pain in legs with walking [] ???Pain in legs at rest [] ???Pain in legs when laying flat [] ???Claudication [] ???Pain in feet when walking [] ???Pain in feet at rest [] ???Pain in feet when laying flat [] ???History of DVT [] ???Phlebitis [x] ???Swelling in legs [] ???Varicose veins [x] ???Non-healing ulcers Pulmonary: [] ???Uses home oxygen [] ???Productive cough [] ???Hemoptysis [] ???Wheeze [] ???COPD [] ???Asthma Neurologic: [] ???Dizziness [] ???Blackouts [] ???Seizures [] ???History of stroke [] ???History of TIA [] ???Aphasia [] ???Temporary blindness [] ???Dysphagia [] ???Weakness or numbness in arms [] ???Weakness or numbness in legs Musculoskeletal: [x] ???Arthritis [] ???Joint swelling [] ???Joint pain [] ???Low back pain Hematologic: [] ???Easy bruising [] ???Easy bleeding [] ???Hypercoagulable state [] ???Anemic  Gastrointestinal: [] ???Blood in stool [] ???Vomiting blood [] ???Gastroesophageal reflux/heartburn [] ???Abdominal pain Genitourinary: [x] ???Chronic kidney disease  [] ???Difficult urination [] ???Frequent urination [] ???Burning with urination [] ???Hematuria Skin: [] ???Rashes [x] ???Ulcers [x] ???Wounds Psychological: [] ???History of anxiety [] ???History of major depression.  Physical Examination  BP 137/70 (BP Location: Right Arm)   Pulse 86   Resp 16   Wt 234 lb (106.1 kg)   BMI 32.64 kg/m  Gen:  WD/WN, NAD.  Appears younger than stated age Head: [place]/AT, No temporalis wasting. Ear/Nose/Throat: Hearing grossly intact, nares w/o erythema or drainage Eyes: Conjunctiva clear. Sclera non-icteric Neck: Supple.  Trachea midline Pulmonary:  Good air movement, no use of accessory muscles.  Cardiac: RRR, no JVD Vascular:  Vessel Right Left  Radial Palpable Palpable                   Musculoskeletal: M/S 5/5 throughout.  No deformity or atrophy.  Moderate to severe stasis dermatitis changes present bilaterally a little worse on the right than the left.  No open wounds or ulcers.  1+ bilateral lower extremity edema. Neurologic: Sensation grossly intact in extremities.  Symmetrical.  Speech is fluent.  Psychiatric: Judgment intact, Mood & affect appropriate for pt's clinical situation. Dermatologic: No rashes or ulcers noted.  No cellulitis or open wounds.       Labs No results found for this or any previous visit (from the past 2160 hour(s)).  Radiology No results found.  Assessment/Plan Essential hypertension blood pressure control important in reducing the progression of atherosclerotic disease. On appropriate oral medications.   Type 2 diabetes mellitus with complication (HCC) blood glucose control important in reducing the progression of atherosclerotic disease. Also, involved in wound healing. On appropriate medications.   Hyperlipidemia lipid control important in reducing the progression of atherosclerotic disease. Continue statin therapy  Varicose veins of lower extremities with ulcer (HCC) Ulcers have healed.   We have held off on venous intervention and given his age with no new worrisome symptoms, we will continue conservative therapy.  Lymphedema Symptoms are currently well controlled with conservative therapies.  No changes.  Recheck in 6 months.    Festus Barren, MD  09/22/2019 3:41 PM    This note was created with Dragon medical transcription system.  Any errors from dictation are purely unintentional

## 2019-09-22 NOTE — Assessment & Plan Note (Signed)
Ulcers have healed.  We have held off on venous intervention and given his age with no new worrisome symptoms, we will continue conservative therapy.

## 2019-09-24 DIAGNOSIS — R600 Localized edema: Secondary | ICD-10-CM | POA: Insufficient documentation

## 2019-12-18 ENCOUNTER — Other Ambulatory Visit: Payer: Self-pay

## 2019-12-18 ENCOUNTER — Ambulatory Visit (INDEPENDENT_AMBULATORY_CARE_PROVIDER_SITE_OTHER): Payer: Medicare PPO | Admitting: Vascular Surgery

## 2019-12-18 ENCOUNTER — Encounter (INDEPENDENT_AMBULATORY_CARE_PROVIDER_SITE_OTHER): Payer: Self-pay | Admitting: Vascular Surgery

## 2019-12-18 VITALS — BP 122/76 | HR 80 | Ht 71.0 in | Wt 229.0 lb

## 2019-12-18 DIAGNOSIS — M542 Cervicalgia: Secondary | ICD-10-CM | POA: Insufficient documentation

## 2019-12-18 DIAGNOSIS — E785 Hyperlipidemia, unspecified: Secondary | ICD-10-CM

## 2019-12-18 DIAGNOSIS — S065X9A Traumatic subdural hemorrhage with loss of consciousness of unspecified duration, initial encounter: Secondary | ICD-10-CM | POA: Insufficient documentation

## 2019-12-18 DIAGNOSIS — M1710 Unilateral primary osteoarthritis, unspecified knee: Secondary | ICD-10-CM | POA: Insufficient documentation

## 2019-12-18 DIAGNOSIS — R05 Cough: Secondary | ICD-10-CM | POA: Insufficient documentation

## 2019-12-18 DIAGNOSIS — R058 Other specified cough: Secondary | ICD-10-CM | POA: Insufficient documentation

## 2019-12-18 DIAGNOSIS — F419 Anxiety disorder, unspecified: Secondary | ICD-10-CM | POA: Insufficient documentation

## 2019-12-18 DIAGNOSIS — I8312 Varicose veins of left lower extremity with inflammation: Secondary | ICD-10-CM | POA: Diagnosis not present

## 2019-12-18 DIAGNOSIS — K219 Gastro-esophageal reflux disease without esophagitis: Secondary | ICD-10-CM | POA: Insufficient documentation

## 2019-12-18 DIAGNOSIS — E118 Type 2 diabetes mellitus with unspecified complications: Secondary | ICD-10-CM | POA: Diagnosis not present

## 2019-12-18 DIAGNOSIS — I83893 Varicose veins of bilateral lower extremities with other complications: Secondary | ICD-10-CM

## 2019-12-18 DIAGNOSIS — I890 Lymphedema, not elsewhere classified: Secondary | ICD-10-CM

## 2019-12-18 DIAGNOSIS — F32A Depression, unspecified: Secondary | ICD-10-CM | POA: Insufficient documentation

## 2019-12-18 DIAGNOSIS — E119 Type 2 diabetes mellitus without complications: Secondary | ICD-10-CM | POA: Diagnosis not present

## 2019-12-18 DIAGNOSIS — I10 Essential (primary) hypertension: Secondary | ICD-10-CM | POA: Diagnosis not present

## 2019-12-18 DIAGNOSIS — Z0000 Encounter for general adult medical examination without abnormal findings: Secondary | ICD-10-CM | POA: Insufficient documentation

## 2019-12-18 DIAGNOSIS — Z9181 History of falling: Secondary | ICD-10-CM | POA: Insufficient documentation

## 2019-12-18 DIAGNOSIS — I8311 Varicose veins of right lower extremity with inflammation: Secondary | ICD-10-CM | POA: Diagnosis not present

## 2019-12-18 DIAGNOSIS — Z87438 Personal history of other diseases of male genital organs: Secondary | ICD-10-CM | POA: Insufficient documentation

## 2019-12-18 DIAGNOSIS — R609 Edema, unspecified: Secondary | ICD-10-CM | POA: Insufficient documentation

## 2019-12-18 DIAGNOSIS — S065XAA Traumatic subdural hemorrhage with loss of consciousness status unknown, initial encounter: Secondary | ICD-10-CM | POA: Insufficient documentation

## 2019-12-18 DIAGNOSIS — J90 Pleural effusion, not elsewhere classified: Secondary | ICD-10-CM | POA: Insufficient documentation

## 2019-12-18 DIAGNOSIS — I509 Heart failure, unspecified: Secondary | ICD-10-CM | POA: Insufficient documentation

## 2019-12-18 DIAGNOSIS — F329 Major depressive disorder, single episode, unspecified: Secondary | ICD-10-CM | POA: Insufficient documentation

## 2019-12-18 NOTE — Progress Notes (Addendum)
MRN : [id number]  Tim Henry is a 86 y.o. (1928/04/07) male who presents with chief complaint of  Chief Complaint  Patient presents with  . Follow-up    Dautghter states pts leg is getting hard  .  History of Present Illness: Patient returns today in follow up prior to scheduled visit for worsening of his right lower extremity swelling.  Is become very hard, firm, and tight.  He has very woody skin bilaterally from his marked stasis dermatitis changes.  He is not currently having weeping or ulceration, but the leg has become much more tender to the touch and noticeably more swollen.  He does try to elevate his legs.  He has not been really wearing his compression stockings as much.  No fevers or chills or signs of infection.  His mobility has gotten fairly poor at this point and getting him out is much more difficult than it used to be.  He is also not getting around nearly as quickly as he used to.  Current Outpatient Medications  Medication Sig Dispense Refill  . ALPRAZolam (XANAX) 0.5 MG tablet Take 0.5 mg by mouth 2 (two) times daily. 1/2 tab, 1 PM    . atropine 1 % ophthalmic solution atropine 1 % eye drops    . carvedilol (COREG) 6.25 MG tablet Take 6.25 mg by mouth 2 (two) times daily with a meal.   10  . Cholecalciferol (VITAMIN D-3 PO) Take by mouth daily. 10 AM    . Difluprednate (DUREZOL) 0.05 % EMUL     . dorzolamide-timolol (COSOPT) 22.3-6.8 MG/ML ophthalmic solution dorzolamide 22.3 mg-timolol 6.8 mg/mL eye drops    . ferrous sulfate 325 (65 FE) MG tablet Take 325 mg by mouth daily with breakfast.    . finasteride (PROSCAR) 5 MG tablet Take 1 tablet (5 mg total) by mouth daily. 30 tablet 11  . furosemide (LASIX) 20 MG tablet Take 20 mg by mouth daily.    Marland Kitchen gabapentin (NEURONTIN) 300 MG capsule Take 300 mg by mouth 2 (two) times daily. 1 PM, 11 PM    . HYDROcodone-acetaminophen (NORCO/VICODIN) 5-325 MG per tablet Take 1 tablet by mouth every 6 (six) hours as needed for  moderate pain. 1 PM, 11PM    . Influenza Virus Vacc Split PF 0.5 ML SUSY Afluria 2014-2015(PF) 45 mcg (15 mcg x 3)/0.5 mL intramuscular syringe    . latanoprost (XALATAN) 0.005 % ophthalmic solution 1 drop at bedtime.    . metFORMIN (GLUCOPHAGE) 500 MG tablet Take 1,000 mg by mouth 2 (two) times daily with a meal. 1 PM, 11 PM    . Multiple Vitamin (MULTIVITAMIN) capsule Take 1 capsule by mouth daily.    . nefazodone (SERZONE) 100 MG tablet Take by mouth.    Marland Kitchen omeprazole (PRILOSEC) 40 MG capsule   5  . potassium chloride (K-DUR) 10 MEQ tablet Take 10 mEq by mouth daily.    . sertraline (ZOLOFT) 100 MG tablet Take 100 mg by mouth daily. 6 PM    . simvastatin (ZOCOR) 10 MG tablet Take 10 mg by mouth daily. 10 AM    . sitaGLIPtin (JANUVIA) 100 MG tablet Take 100 mg by mouth daily.    . sucralfate (CARAFATE) 1 G tablet Take 1 g by mouth 2 (two) times daily. 10 AM, 11 PM    . tamsulosin (FLOMAX) 0.4 MG CAPS capsule Take 1 capsule (0.4 mg total) by mouth daily after breakfast. 1 PM 30 capsule 11  . telmisartan (  MICARDIS) 40 MG tablet   5  . triamcinolone ointment (KENALOG) 0.1 % triamcinolone acetonide 0.1 % topical ointment    . verapamil (COVERA HS) 240 MG (CO) 24 hr tablet Take 240 mg by mouth daily. 6 PM    . Vitamin D, Ergocalciferol, (DRISDOL) 1.25 MG (50000 UT) CAPS capsule   5   No current facility-administered medications for this visit.    Past Medical History:  Diagnosis Date  . Anemia    in past  . Anxiety   . Dementia ([facility])    able to sign own papers  . Depression   . Diabetes mellitus without complication ([facility])   . Full dentures    upper and lower  . GERD (gastroesophageal reflux disease)   . Head trauma    admitted to [facility], "pint of blood removed from head", no deficits after  . Hypercholesteremia   . Hypertension   . 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 APPENDECTOMY     childhood  .  BACK SURGERY  1963  . CATARACT EXTRACTION W/PHACO Left 09/15/2014   Procedure: CATARACT EXTRACTION PHACO AND INTRAOCULAR LENS PLACEMENT (IOC);  Surgeon: Lockie Mola, MD;  Location: [facility];  Service: Ophthalmology;  Laterality: Left;  DIABETIC PT WOULD LIKE TO HAVE ARRIVAL TIME LATE AM  . JOINT REPLACEMENT Left    knee  . PHOTOCOAGULATION WITH LASER Right 01/23/2016   Procedure: PHOTOCOAGULATION WITH LASER;  Surgeon: Sherald Hess, MD;  Location: [facility];  Service: Ophthalmology;  Laterality: Right;  DIABETIC - oral meds RIGHT Requests arrival 10AM or after     Social History   Tobacco Use  . Smoking status: Former Smoker    Quit date: 04/30/1966    Years since quitting: 53.6  . Smokeless tobacco: Never Used  Substance Use Topics  . Alcohol use: No  . Drug use: No     Family History  Problem Relation Age of Onset  . Heart attack Mother   . Diabetes Mother   . Heart attack Father   . Diabetes Sister     No Known Allergies   REVIEW OF SYSTEMS(Negative unless checked)  Constitutional: [] ????Weight loss [] ????Fever [] ????Chills Cardiac: [] ????Chest pain [] ????Chest pressure [] ????Palpitations [] ????Shortness of breath when laying flat [] ????Shortness of breath at rest [] ????Shortness of breath with exertion. Vascular: [] ????Pain in legs with walking [] ????Pain in legs at rest [] ????Pain in legs when laying flat [] ????Claudication [] ????Pain in feet when walking [] ????Pain in feet at rest [] ????Pain in feet when laying flat [] ????History of DVT [] ????Phlebitis [x] ????Swelling in legs [] ????Varicose veins [x] ????Non-healing ulcers Pulmonary: [] ????Uses home oxygen [] ????Productive cough [] ????Hemoptysis [] ????Wheeze [] ????COPD [] ????Asthma Neurologic: [] ????Dizziness [] ????Blackouts [] ????Seizures [] ????History of stroke [] ????History of TIA [] ????Aphasia [] ????Temporary blindness  [] ????Dysphagia [] ????Weakness or numbness in arms [] ????Weakness or numbness in legs Musculoskeletal: [x] ????Arthritis [] ????Joint swelling [] ????Joint pain [] ????Low back pain Hematologic: [] ????Easy bruising [] ????Easy bleeding [] ????Hypercoagulable state [] ????Anemic  Gastrointestinal: [] ????Blood in stool [] ????Vomiting blood [] ????Gastroesophageal reflux/heartburn [] ????Abdominal pain Genitourinary: [x] ????Chronic kidney disease [] ????Difficult urination [] ????Frequent urination [] ????Burning with urination [] ????Hematuria Skin: [] ????Rashes [x] ????Ulcers [x] ????Wounds Psychological: [] ????History of anxiety [] ????History of major depression.  Physical Examination  BP 122/76   Pulse 80   Ht 5\' 11"  (1.803 m)   Wt 229 lb (103.9 kg)   BMI 31.94 kg/m  Gen:  WD/WN, NAD. Appears younger than stated age. Head: [place]/AT, No temporalis wasting. Ear/Nose/Throat: Hearing grossly intact, nares w/o erythema or drainage  Eyes: Conjunctiva clear. Sclera non-icteric Neck: Supple.  Trachea midline Pulmonary:  Good air movement, no use of accessory muscles.  Cardiac: irregular Vascular:  Vessel Right Left  Radial Palpable Palpable                          PT Not Palpable Not Palpable  DP Not Palpable Trace Palpable   Gastrointestinal: soft, non-tender/non-distended. No guarding/reflex.  Musculoskeletal: M/S 5/5 throughout.  No deformity or atrophy.  2-3+ right lower extremity edema, 1+ left lower extremity edema.  Marked stasis dermatitis changes are present bilaterally a little worse on the right than the left Neurologic: Sensation grossly intact in extremities.  Symmetrical.  Speech is fluent.  Psychiatric: Judgment intact, Mood & affect appropriate for pt's clinical situation. Dermatologic: No rashes or ulcers noted.  No cellulitis or open wounds.       Labs No results found for this or any previous visit (from the past 2160  hour(s)).  Radiology No results found.  Assessment/Plan Essential hypertension blood pressure control important in reducing the progression of atherosclerotic disease. On appropriate oral medications.   Type 2 diabetes mellitus with complication (HCC) blood glucose control important in reducing the progression of atherosclerotic disease. Also, involved in wound healing. On appropriate medications.   Hyperlipidemia lipid control important in reducing the progression of atherosclerotic disease. Continue statin therapy  Lymphedema Swelling in the right lower extremity has significantly worsened.  A 3 layer Unna boot was placed today and will be changed weekly.  We will get home health come out and do this.  Varicose veins of bilateral lower extremities with other complications The patient is having worsening swelling of his right leg.  He has a known history of significant venous disease.  At his next visit in a few weeks, we will perform a venous reflux study of the right leg for further evaluation to determine if any further venous treatments would be of benefit.    Festus Barren, MD  12/18/2019 10:47 AM    This note was created with Dragon medical transcription system.  Any errors from dictation are purely unintentional

## 2019-12-18 NOTE — Assessment & Plan Note (Signed)
The patient is having worsening swelling of his right leg.  He has a known history of significant venous disease.  At his next visit in a few weeks, we will perform a venous reflux study of the right leg for further evaluation to determine if any further venous treatments would be of benefit.

## 2019-12-18 NOTE — Assessment & Plan Note (Addendum)
Swelling in the right lower extremity has significantly worsened.  A 3 layer Unna boot was placed today and will be changed weekly.  We will get home health come out and do this if possible as his mobility has gotten very poor and coming in to do that is quite difficult at this point.Tim Henry

## 2019-12-22 ENCOUNTER — Telehealth (INDEPENDENT_AMBULATORY_CARE_PROVIDER_SITE_OTHER): Payer: Self-pay

## 2019-12-22 NOTE — Telephone Encounter (Signed)
Patient was seen on 12/18/19 and we have apply to get home health to start coming out to the patient home for unna wraps changes weekly. Unfortunately home health will not be able to come out the home and the patient will need to be schedule for weekly unna wraps in the office. I left a detail message on the patient daughter voicemail to call back to schedule appointments.

## 2019-12-25 ENCOUNTER — Other Ambulatory Visit: Payer: Self-pay

## 2019-12-25 ENCOUNTER — Ambulatory Visit (INDEPENDENT_AMBULATORY_CARE_PROVIDER_SITE_OTHER): Payer: Medicare PPO | Admitting: Nurse Practitioner

## 2019-12-25 VITALS — BP 120/70 | HR 80 | Ht 71.0 in | Wt 226.0 lb

## 2019-12-25 DIAGNOSIS — I890 Lymphedema, not elsewhere classified: Secondary | ICD-10-CM | POA: Diagnosis not present

## 2019-12-25 NOTE — Progress Notes (Signed)
History of Present Illness  There is no documented history at this time  Assessments & Plan   There are no diagnoses linked to this encounter.    Additional instructions  Subjective:  Patient presents with venous ulcer of the Left lower extremity.    Procedure:  3 layer unna wrap was placed Left lower extremity.   Plan:   Follow up in one week.  

## 2019-12-28 ENCOUNTER — Encounter (INDEPENDENT_AMBULATORY_CARE_PROVIDER_SITE_OTHER): Payer: Self-pay | Admitting: Nurse Practitioner

## 2020-01-01 ENCOUNTER — Ambulatory Visit (INDEPENDENT_AMBULATORY_CARE_PROVIDER_SITE_OTHER): Payer: Medicare PPO | Admitting: Vascular Surgery

## 2020-01-01 ENCOUNTER — Other Ambulatory Visit: Payer: Self-pay

## 2020-01-01 ENCOUNTER — Ambulatory Visit (INDEPENDENT_AMBULATORY_CARE_PROVIDER_SITE_OTHER): Payer: Medicare PPO

## 2020-01-01 ENCOUNTER — Encounter (INDEPENDENT_AMBULATORY_CARE_PROVIDER_SITE_OTHER): Payer: Self-pay | Admitting: Vascular Surgery

## 2020-01-01 VITALS — BP 151/76 | HR 71 | Resp 17 | Ht 71.0 in | Wt 229.0 lb

## 2020-01-01 DIAGNOSIS — I83893 Varicose veins of bilateral lower extremities with other complications: Secondary | ICD-10-CM | POA: Diagnosis not present

## 2020-01-01 DIAGNOSIS — I890 Lymphedema, not elsewhere classified: Secondary | ICD-10-CM

## 2020-01-01 DIAGNOSIS — I10 Essential (primary) hypertension: Secondary | ICD-10-CM

## 2020-01-01 DIAGNOSIS — E785 Hyperlipidemia, unspecified: Secondary | ICD-10-CM | POA: Diagnosis not present

## 2020-01-01 DIAGNOSIS — E118 Type 2 diabetes mellitus with unspecified complications: Secondary | ICD-10-CM

## 2020-01-01 DIAGNOSIS — L97221 Non-pressure chronic ulcer of left calf limited to breakdown of skin: Secondary | ICD-10-CM

## 2020-01-01 DIAGNOSIS — I83022 Varicose veins of left lower extremity with ulcer of calf: Secondary | ICD-10-CM

## 2020-01-01 NOTE — Progress Notes (Signed)
MRN : [id number]  Tim Henry is a 86 y.o. (07/30/1927) male who presents with chief complaint of  Chief Complaint  Patient presents with  . Follow-up    ultrasound  .  History of Present Illness: Patient returns today in follow up of his leg swelling.  He is doing well.  He has been in [employer] now for 2 weeks with significant improvement in the swelling and firmness of his right leg.  We did a duplex today which showed no obvious venous reflux in either lower extremity.  A small portion of the left great saphenous vein showed some degree of superficial thrombophlebitis but his left leg is not bothering him.  Current Outpatient Medications  Medication Sig Dispense Refill  . ALPRAZolam (XANAX) 0.5 MG tablet Take 0.5 mg by mouth 2 (two) times daily. 1/2 tab, 1 PM    . atropine 1 % ophthalmic solution atropine 1 % eye drops    . carvedilol (COREG) 6.25 MG tablet Take 6.25 mg by mouth 2 (two) times daily with a meal.   10  . Cholecalciferol (VITAMIN D-3 PO) Take by mouth daily. 10 AM    . Difluprednate (DUREZOL) 0.05 % EMUL     . dorzolamide-timolol (COSOPT) 22.3-6.8 MG/ML ophthalmic solution dorzolamide 22.3 mg-timolol 6.8 mg/mL eye drops    . ferrous sulfate 325 (65 FE) MG tablet Take 325 mg by mouth daily with breakfast.    . finasteride (PROSCAR) 5 MG tablet Take 1 tablet (5 mg total) by mouth daily. 30 tablet 11  . furosemide (LASIX) 20 MG tablet Take 20 mg by mouth daily.    Marland Kitchen gabapentin (NEURONTIN) 300 MG capsule Take 300 mg by mouth 2 (two) times daily. 1 PM, 11 PM    . HYDROcodone-acetaminophen (NORCO/VICODIN) 5-325 MG per tablet Take 1 tablet by mouth every 6 (six) hours as needed for moderate pain. 1 PM, 11PM    . latanoprost (XALATAN) 0.005 % ophthalmic solution 1 drop at bedtime.    . metFORMIN (GLUCOPHAGE) 500 MG tablet Take 1,000 mg by mouth 2 (two) times daily with a meal. 1 PM, 11 PM    . Multiple Vitamin (MULTIVITAMIN) capsule Take 1 capsule by mouth daily.    .  nefazodone (SERZONE) 100 MG tablet Take by mouth.    Marland Kitchen omeprazole (PRILOSEC) 40 MG capsule   5  . potassium chloride (K-DUR) 10 MEQ tablet Take 10 mEq by mouth daily.    . sertraline (ZOLOFT) 100 MG tablet Take 100 mg by mouth daily. 6 PM    . simvastatin (ZOCOR) 10 MG tablet Take 10 mg by mouth daily. 10 AM    . sitaGLIPtin (JANUVIA) 100 MG tablet Take 100 mg by mouth daily.    . sucralfate (CARAFATE) 1 G tablet Take 1 g by mouth 2 (two) times daily. 10 AM, 11 PM    . tamsulosin (FLOMAX) 0.4 MG CAPS capsule Take 1 capsule (0.4 mg total) by mouth daily after breakfast. 1 PM 30 capsule 11  . telmisartan (MICARDIS) 40 MG tablet   5  . verapamil (COVERA HS) 240 MG (CO) 24 hr tablet Take 240 mg by mouth daily. 6 PM    . Vitamin D, Ergocalciferol, (DRISDOL) 1.25 MG (50000 UT) CAPS capsule   5  . Influenza Virus Vacc Split PF 0.5 ML SUSY Afluria 2014-2015(PF) 45 mcg (15 mcg x 3)/0.5 mL intramuscular syringe    . triamcinolone ointment (KENALOG) 0.1 % triamcinolone acetonide 0.1 % topical ointment (Patient  not taking: Reported on 01/01/2020)     No current facility-administered medications for this visit.    Past Medical History:  Diagnosis Date  . Anemia    in past  . Anxiety   . Dementia (HCC)    able to sign own papers  . Depression   . Diabetes mellitus without complication (HCC)   . Full dentures    upper and lower  . GERD (gastroesophageal reflux disease)   . Head trauma    admitted to [facility], "pint of blood removed from head", no deficits after  . Hypercholesteremia   . Hypertension   . 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 APPENDECTOMY     childhood  . BACK SURGERY  1963  . CATARACT EXTRACTION W/PHACO Left 09/15/2014   Procedure: CATARACT EXTRACTION PHACO AND INTRAOCULAR LENS PLACEMENT (IOC);  Surgeon: Lockie Mola, MD;  Location: [facility];  Service: Ophthalmology;  Laterality:  Left;  DIABETIC PT WOULD LIKE TO HAVE ARRIVAL TIME LATE AM  . JOINT REPLACEMENT Left    knee  . PHOTOCOAGULATION WITH LASER Right 01/23/2016   Procedure: PHOTOCOAGULATION WITH LASER;  Surgeon: Sherald Hess, MD;  Location: [facility];  Service: Ophthalmology;  Laterality: Right;  DIABETIC - oral meds RIGHT Requests arrival 10AM or after     Social History   Tobacco Use  . Smoking status: Former Smoker    Quit date: 04/30/1966    Years since quitting: 53.7  . Smokeless tobacco: Never Used  Substance Use Topics  . Alcohol use: No  . Drug use: No      Family History  Problem Relation Age of Onset  . Heart attack Mother   . Diabetes Mother   . Heart attack Father   . Diabetes Sister      No Known Allergies  REVIEW OF SYSTEMS(Negative unless checked)  Constitutional: [] ?????Weight loss [] ?????Fever [] ?????Chills Cardiac: [] ?????Chest pain [] ?????Chest pressure [] ?????Palpitations [] ?????Shortness of breath when laying flat [] ?????Shortness of breath at rest [] ?????Shortness of breath with exertion. Vascular: [] ?????Pain in legs with walking [] ?????Pain in legs at rest [] ?????Pain in legs when laying flat [] ?????Claudication [] ?????Pain in feet when walking [] ?????Pain in feet at rest [] ?????Pain in feet when laying flat [] ?????History of DVT [] ?????Phlebitis [x] ?????Swelling in legs [] ?????Varicose veins [x] ?????Non-healing ulcers Pulmonary: [] ?????Uses home oxygen [] ?????Productive cough [] ?????Hemoptysis [] ?????Wheeze [] ?????COPD [] ?????Asthma Neurologic: [] ?????Dizziness [] ?????Blackouts [] ?????Seizures [] ?????History of stroke [] ?????History of TIA [] ?????Aphasia [] ?????Temporary blindness [] ?????Dysphagia [] ?????Weakness or numbness in arms [] ?????Weakness or numbness in legs Musculoskeletal: [x] ?????Arthritis [] ?????Joint swelling [] ?????Joint pain [] ?????Low back pain Hematologic:  [] ?????Easy bruising [] ?????Easy bleeding [] ?????Hypercoagulable state [] ?????Anemic  Gastrointestinal: [] ?????Blood in stool [] ?????Vomiting blood [] ?????Gastroesophageal reflux/heartburn [] ?????Abdominal pain Genitourinary: [x] ?????Chronic kidney disease [] ?????Difficult urination [] ?????Frequent urination [] ?????Burning with urination [] ?????Hematuria Skin: [] ?????Rashes [x] ?????Ulcers [x] ?????Wounds Psychological: [] ?????History of anxiety [] ?????History of major depression.   Physical Examination  BP (!) 151/76 (BP Location: Left Arm)   Pulse 71   Resp 17   Ht 5\' 11"  (1.803 m)   Wt 229 lb (103.9 kg)   BMI 31.94 kg/m  Gen:  WD/WN, NAD Head: [place]/AT, No temporalis wasting. Ear/Nose/Throat: Hearing grossly intact, nares w/o erythema or drainage Eyes: Conjunctiva clear. Sclera non-icteric Neck: Supple.  Trachea midline Pulmonary:  Good air movement, no use of accessory muscles.  Cardiac: RRR, no JVD Vascular:  Vessel Right Left  Radial Palpable Palpable  Musculoskeletal: M/S 5/5 throughout.  No deformity or atrophy.  1+ right lower extremity edema, trace left lower extremity edema.  Marked stasis changes present bilaterally Neurologic: Sensation grossly intact in extremities.  Symmetrical.  Speech is fluent.  Psychiatric: Judgment intact, Mood & affect appropriate for pt's clinical situation. Dermatologic: No rashes or ulcers noted.  No cellulitis or open wounds.       Labs No results found for this or any previous visit (from the past 2160 hour(s)).  Radiology VAS US LOWER EXTREMITY VENOUS REFLUX  Result Date: 01/01/2020  Lower Venous Reflux Study Indications: Varicosities.  Performing Technologist: Debbe BalesSolomon Mcclary RVS  Examination Guidelines: A complete evaluation includes B-mode imaging, spectral Doppler, color Doppler, and power Doppler as needed of all accessible portions of each vessel. Bilateral testing is considered  an integral part of a complete examination. Limited examinations for reoccurring indications may be performed as noted. The reflux portion of the exam is performed with the patient in reverse Trendelenburg. Significant venous reflux is defined as >500 ms in the superficial venous system, and >1 second in the deep venous system.  Venous Reflux Times +--------------+---------+------+-----------+------------+--------+ RIGHT         Reflux NoRefluxReflux TimeDiameter cmsComments                         Yes                                  +--------------+---------+------+-----------+------------+--------+ CFV           no                                             +--------------+---------+------+-----------+------------+--------+ FV prox       no                                             +--------------+---------+------+-----------+------------+--------+ FV mid        no                                             +--------------+---------+------+-----------+------------+--------+ FV dist       no                                             +--------------+---------+------+-----------+------------+--------+ Popliteal     no                                             +--------------+---------+------+-----------+------------+--------+ GSV at SFJ    no                            .50              +--------------+---------+------+-----------+------------+--------+ GSV prox thighno                            .  65              +--------------+---------+------+-----------+------------+--------+ GSV mid thigh no                            .57              +--------------+---------+------+-----------+------------+--------+ GSV dist thighno                            .48              +--------------+---------+------+-----------+------------+--------+ GSV at knee   no                            .41               +--------------+---------+------+-----------+------------+--------+ GSV prox calf no                            .37              +--------------+---------+------+-----------+------------+--------+  +--------------+---------+------+-----------+------------+--------+ LEFT          Reflux NoRefluxReflux TimeDiameter cmsComments                         Yes                                  +--------------+---------+------+-----------+------------+--------+ CFV           no                                             +--------------+---------+------+-----------+------------+--------+ FV mid        no                                             +--------------+---------+------+-----------+------------+--------+ FV dist       no                                             +--------------+---------+------+-----------+------------+--------+ Popliteal     no                                             +--------------+---------+------+-----------+------------+--------+ GSV at SFJ    no                            .58              +--------------+---------+------+-----------+------------+--------+ GSV prox thighno                            .41              +--------------+---------+------+-----------+------------+--------+ GSV mid thigh no                            .  30              +--------------+---------+------+-----------+------------+--------+ GSV dist thighno                            .24              +--------------+---------+------+-----------+------------+--------+ GSV at knee   no                            .35              +--------------+---------+------+-----------+------------+--------+ GSV prox calf                                       NWV      +--------------+---------+------+-----------+------------+--------+   Summary: Bilateral: - No evidence of deep vein thrombosis seen in the lower extremities, bilaterally, from the  common femoral through the popliteal veins.  - No evidence of deep venous insufficiency seen bilaterally in the lower extremity.  - No evidence of superficial venous reflux seen in the greater saphenous veins bilaterally.  Right: - The Right SSV was not visualized due to poor patient positioning.  Left: - The Left SSV was not visualized due to poor patient positioning. The Left GSV distal thigh and Knee area appears to display signs of An Acute Process of Thrombophlebitis.  *See table(s) above for measurements and observations. Electronically signed by Festus Barren MD on 01/01/2020 at 12:47:04 PM.    Final     Assessment/Plan Essential hypertension blood pressure control important in reducing the progression of atherosclerotic disease. On appropriate oral medications.   Type 2 diabetes mellitus with complication (HCC) blood glucose control important in reducing the progression of atherosclerotic disease. Also, involved in wound healing. On appropriate medications.   Hyperlipidemia lipid control important in reducing the progression of atherosclerotic disease. Continue statin therapy  Lymphedema Doing better with 2 weeks of Unna boots.  Were going to continue the Unna boots for 2 more weeks to try to get him back to a baseline.  Recheck at that time.  Varicose veins of lower extremities with ulcer (HCC) We did a duplex today which showed no obvious venous reflux in either lower extremity.  A small portion of the left great saphenous vein showed some degree of superficial thrombophlebitis but his left leg is not bothering him. Currently doing well with Unna boots and conservative therapy.  No role for intervention with these findings today.    Festus Barren, MD  01/01/2020 12:56 PM    This note was created with Dragon medical transcription system.  Any errors from dictation are purely unintentional

## 2020-01-01 NOTE — Assessment & Plan Note (Signed)
We did a duplex today which showed no obvious venous reflux in either lower extremity.  A small portion of the left great saphenous vein showed some degree of superficial thrombophlebitis but his left leg is not bothering him. Currently doing well with Unna boots and conservative therapy.  No role for intervention with these findings today.

## 2020-01-01 NOTE — Assessment & Plan Note (Signed)
Doing better with 2 weeks of Unna boots.  Were going to continue the Unna boots for 2 more weeks to try to get him back to a baseline.  Recheck at that time.

## 2020-01-07 ENCOUNTER — Other Ambulatory Visit: Payer: Self-pay

## 2020-01-07 ENCOUNTER — Ambulatory Visit (INDEPENDENT_AMBULATORY_CARE_PROVIDER_SITE_OTHER): Payer: Medicare PPO | Admitting: Nurse Practitioner

## 2020-01-07 VITALS — BP 116/77 | HR 86 | Resp 16 | Ht 71.0 in | Wt 230.0 lb

## 2020-01-07 DIAGNOSIS — L03119 Cellulitis of unspecified part of limb: Secondary | ICD-10-CM

## 2020-01-07 NOTE — Progress Notes (Signed)
History of Present Illness  There is no documented history at this time  Assessments & Plan   There are no diagnoses linked to this encounter.    Additional instructions  Subjective:  Patient presents with venous ulcer of the Right lower extremity.    Procedure:  3 layer unna wrap was placed Right lower extremity.   Plan:   Follow up in one week.   

## 2020-01-08 ENCOUNTER — Encounter (INDEPENDENT_AMBULATORY_CARE_PROVIDER_SITE_OTHER): Payer: Medicare PPO

## 2020-01-08 ENCOUNTER — Ambulatory Visit (INDEPENDENT_AMBULATORY_CARE_PROVIDER_SITE_OTHER): Payer: Medicare PPO | Admitting: Nurse Practitioner

## 2020-01-11 ENCOUNTER — Encounter (INDEPENDENT_AMBULATORY_CARE_PROVIDER_SITE_OTHER): Payer: Self-pay | Admitting: Nurse Practitioner

## 2020-01-14 ENCOUNTER — Encounter (INDEPENDENT_AMBULATORY_CARE_PROVIDER_SITE_OTHER): Payer: Medicare PPO

## 2020-03-05 ENCOUNTER — Other Ambulatory Visit: Payer: Self-pay | Admitting: Urology

## 2020-03-05 DIAGNOSIS — N401 Enlarged prostate with lower urinary tract symptoms: Secondary | ICD-10-CM

## 2020-03-05 DIAGNOSIS — R350 Frequency of micturition: Secondary | ICD-10-CM

## 2020-03-22 ENCOUNTER — Ambulatory Visit (INDEPENDENT_AMBULATORY_CARE_PROVIDER_SITE_OTHER): Payer: Medicare PPO | Admitting: Vascular Surgery

## 2020-04-13 ENCOUNTER — Ambulatory Visit: Payer: Medicare Other | Admitting: Urology

## 2020-04-13 ENCOUNTER — Other Ambulatory Visit: Payer: Self-pay

## 2020-04-13 ENCOUNTER — Ambulatory Visit (INDEPENDENT_AMBULATORY_CARE_PROVIDER_SITE_OTHER): Payer: Medicare PPO | Admitting: Urology

## 2020-04-13 ENCOUNTER — Encounter: Payer: Self-pay | Admitting: Urology

## 2020-04-13 VITALS — BP 120/73 | HR 81 | Ht 71.0 in | Wt 230.0 lb

## 2020-04-13 DIAGNOSIS — N433 Hydrocele, unspecified: Secondary | ICD-10-CM | POA: Diagnosis not present

## 2020-04-13 DIAGNOSIS — R350 Frequency of micturition: Secondary | ICD-10-CM

## 2020-04-13 DIAGNOSIS — N401 Enlarged prostate with lower urinary tract symptoms: Secondary | ICD-10-CM | POA: Diagnosis not present

## 2020-04-13 LAB — URINALYSIS, COMPLETE
Bilirubin, UA: NEGATIVE
Glucose, UA: NEGATIVE
Ketones, UA: NEGATIVE
Leukocytes,UA: NEGATIVE
Nitrite, UA: NEGATIVE
Protein,UA: NEGATIVE
Specific Gravity, UA: 1.02 (ref 1.005–1.030)
Urobilinogen, Ur: 0.2 mg/dL (ref 0.2–1.0)
pH, UA: 5.5 (ref 5.0–7.5)

## 2020-04-13 LAB — MICROSCOPIC EXAMINATION
Bacteria, UA: NONE SEEN
Epithelial Cells (non renal): NONE SEEN /hpf (ref 0–10)

## 2020-04-13 LAB — BLADDER SCAN AMB NON-IMAGING: Scan Result: 0

## 2020-04-13 MED ORDER — TAMSULOSIN HCL 0.4 MG PO CAPS: 0.4000 mg | ORAL_CAPSULE | Freq: Every day | ORAL | 11 refills | Status: DC

## 2020-04-13 MED ORDER — FINASTERIDE 5 MG PO TABS: 5.0000 mg | ORAL_TABLET | Freq: Every day | ORAL | 11 refills | Status: DC

## 2020-04-13 NOTE — Progress Notes (Signed)
04/13/2020 10:20 AM   Tim Henry [birth year] [id number]  Referring provider: Aniceto Boss, MD [address] [place],  [place] [address]  Chief Complaint  Patient presents with  . Benign Prostatic Hypertrophy    Urologic history: 1.BPH with lower urinary tract symptoms -Followed since 2015; initially treated with tamsulosin with significant improvement in his symptoms -Finasteride added for worseningLUTS  2.Microhematuria evaluation September 2017 -Renal ultrasound bilateral simple cysts -Cystoscopy with trilobar enlargement/prominent hypervascularity   HPI: 86 y.o. male presents for annual follow-up.   Stable lower urinary tract symptoms since last years visit  IPSS: 6/35  Remains on tamsulosin, finasteride  Denies dysuria, gross hematuria  Denies flank, abdominal or pelvic pain  Does relate to some increased laxity of the scrotum and occasional mild scrotal pain    PMH: Past Medical History:  Diagnosis Date  . Anemia    in past  . Anxiety   . Dementia (HCC)    able to sign own papers  . Depression   . Diabetes mellitus without complication (HCC)   . Full dentures    upper and lower  . GERD (gastroesophageal reflux disease)   . Head trauma    admitted to [facility], "pint of blood removed from head", no deficits after  . Hypercholesteremia   . Hypertension   . 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 APPENDECTOMY     childhood  . BACK SURGERY  1963  . CATARACT EXTRACTION W/PHACO Left 09/15/2014   Procedure: CATARACT EXTRACTION PHACO AND INTRAOCULAR LENS PLACEMENT (IOC);  Surgeon: Lockie Mola, MD;  Location: [facility];  Service: Ophthalmology;  Laterality: Left;  DIABETIC PT WOULD LIKE TO HAVE ARRIVAL TIME LATE AM  . JOINT REPLACEMENT Left    knee  . PHOTOCOAGULATION WITH LASER Right 01/23/2016   Procedure: PHOTOCOAGULATION WITH  LASER;  Surgeon: Sherald Hess, MD;  Location: [facility];  Service: Ophthalmology;  Laterality: Right;  DIABETIC - oral meds RIGHT Requests arrival 10AM or after    Home Medications:  Allergies as of 04/13/2020   No Known Allergies     Medication List       Accurate as of April 13, 2020 10:20 AM. If you have any questions, ask your nurse or doctor.        ALPRAZolam 0.5 MG tablet Commonly known as: XANAX Take 0.5 mg by mouth 2 (two) times daily. 1/2 tab, 1 PM   atropine 1 % ophthalmic solution atropine 1 % eye drops   carvedilol 6.25 MG tablet Commonly known as: COREG Take 6.25 mg by mouth 2 (two) times daily with a meal.   dorzolamide-timolol 22.3-6.8 MG/ML ophthalmic solution Commonly known as: COSOPT dorzolamide 22.3 mg-timolol 6.8 mg/mL eye drops   Durezol 0.05 % Emul Generic drug: Difluprednate   ferrous sulfate 325 (65 FE) MG tablet Take 325 mg by mouth daily with breakfast.   finasteride 5 MG tablet Commonly known as: PROSCAR TAKE 1 TABLET BY MOUTH ONCE DAILY.   furosemide 20 MG tablet Commonly known as: LASIX Take 20 mg by mouth daily.   gabapentin 300 MG capsule Commonly known as: NEURONTIN Take 300 mg by mouth 2 (two) times daily. 1 PM, 11 PM   HYDROcodone-acetaminophen 5-325 MG tablet Commonly known as: NORCO/VICODIN Take 1 tablet by mouth every 6 (six) hours as needed for moderate pain. 1 PM, 11PM   Influenza  Virus Vacc Split PF 0.5 ML Susy Afluria 2014-2015(PF) 45 mcg (15 mcg x 3)/0.5 mL intramuscular syringe   latanoprost 0.005 % ophthalmic solution Commonly known as: XALATAN 1 drop at bedtime.   metFORMIN 500 MG tablet Commonly known as: GLUCOPHAGE Take 1,000 mg by mouth 2 (two) times daily with a meal. 1 PM, 11 PM   multivitamin capsule Take 1 capsule by mouth daily.   nefazodone 100 MG tablet Commonly known as: SERZONE Take by mouth.   omeprazole 40 MG capsule Commonly known as: PRILOSEC   potassium  chloride 10 MEQ tablet Commonly known as: KLOR-CON Take 10 mEq by mouth daily.   sertraline 100 MG tablet Commonly known as: ZOLOFT Take 100 mg by mouth daily. 6 PM   simvastatin 10 MG tablet Commonly known as: ZOCOR Take 10 mg by mouth daily. 10 AM   sitaGLIPtin 100 MG tablet Commonly known as: JANUVIA Take 100 mg by mouth daily.   sucralfate 1 g tablet Commonly known as: CARAFATE Take 1 g by mouth 2 (two) times daily. 10 AM, 11 PM   tamsulosin 0.4 MG Caps capsule Commonly known as: FLOMAX Take 1 capsule (0.4 mg total) by mouth daily after breakfast. 1 PM   telmisartan 40 MG tablet Commonly known as: MICARDIS   triamcinolone ointment 0.1 % Commonly known as: KENALOG triamcinolone acetonide 0.1 % topical ointment   verapamil 240 MG (CO) 24 hr tablet Commonly known as: COVERA HS Take 240 mg by mouth daily. 6 PM   Vitamin D (Ergocalciferol) 1.25 MG (50000 UNIT) Caps capsule Commonly known as: DRISDOL   VITAMIN D-3 PO Take by mouth daily. 10 AM       Allergies: No Known Allergies  Family History: Family History  Problem Relation Age of Onset  . Heart attack Mother   . Diabetes Mother   . Heart attack Father   . Diabetes Sister     Social History:  reports that he quit smoking about 53 years ago. He has never used smokeless tobacco. He reports that he does not drink alcohol and does not use drugs.   Physical Exam: BP 120/73   Pulse 81   Ht 5\' 11"  (1.803 m)   Wt 230 lb (104.3 kg)   BMI 32.08 kg/m   Constitutional:  Alert and oriented, No acute distress. HEENT: [place] AT, moist mucus membranes.  Trachea midline, no masses. Cardiovascular: No clubbing, cyanosis, or edema. Respiratory: Normal respiratory effort, no increased work of breathing. GU: Prostate 30 g, smooth without nodules.  Testes descended bilaterally without masses or tenderness.  Right testis atrophic; small to moderate left hydrocele Skin: No rashes, bruises or suspicious  lesions. Neurologic: Grossly intact, no focal deficits, moving all 4 extremities. Psychiatric: Normal mood and affect.   Assessment & Plan:    1. Benign prostatic hyperplasia with urinary frequency  Stable LUTS on combination therapy  Tamsulosin/finasteride refilled  Bladder scan PVR 0 mL  Continue annual follow-up  2.  Left hydrocele  Mild scrotal pain  Continue to monitor  Call for worsening symptoms    , MD  [facility] [address] [place], [place] [phone]

## 2020-05-06 ENCOUNTER — Ambulatory Visit (INDEPENDENT_AMBULATORY_CARE_PROVIDER_SITE_OTHER): Payer: Medicare PPO | Admitting: Vascular Surgery

## 2020-05-27 ENCOUNTER — Other Ambulatory Visit: Payer: Self-pay

## 2020-05-27 ENCOUNTER — Ambulatory Visit (INDEPENDENT_AMBULATORY_CARE_PROVIDER_SITE_OTHER): Payer: Medicare PPO | Admitting: Nurse Practitioner

## 2020-05-27 ENCOUNTER — Encounter (INDEPENDENT_AMBULATORY_CARE_PROVIDER_SITE_OTHER): Payer: Self-pay | Admitting: Nurse Practitioner

## 2020-05-27 VITALS — BP 156/89 | HR 89 | Resp 16 | Wt 225.0 lb

## 2020-05-27 DIAGNOSIS — E785 Hyperlipidemia, unspecified: Secondary | ICD-10-CM

## 2020-05-27 DIAGNOSIS — I10 Essential (primary) hypertension: Secondary | ICD-10-CM

## 2020-05-27 DIAGNOSIS — I890 Lymphedema, not elsewhere classified: Secondary | ICD-10-CM | POA: Diagnosis not present

## 2020-05-27 NOTE — Progress Notes (Signed)
Subjective:    Patient ID: Tim Henry, male    DOB: 05-29-1927, 87 y.o.   MRN: [id number] Chief Complaint  Patient presents with  . Follow-up    6 month follow up    The patient returns to the office for followup evaluation regarding leg swelling.  The swelling has improved quite a bit and the pain associated with swelling has decreased substantially, but not completely resolved. There have not been any interval development of a ulcerations or wounds.  Since the previous visit the patient has been wearing graduated compression stockings and has noted little significant improvement in the lymphedema. The patient has been using compression routinely morning until night.  The patient also states elevation during the day and exercise is being done too.      Review of Systems  Cardiovascular: Positive for leg swelling.  Musculoskeletal: Positive for arthralgias and gait problem.  All other systems reviewed and are negative.      Objective:   Physical Exam Vitals reviewed.  HENT:     Head: Normocephalic.  Cardiovascular:     Rate and Rhythm: Normal rate.     Pulses: Normal pulses.  Pulmonary:     Effort: Pulmonary effort is normal.  Skin:    General: Skin is warm and dry.     Comments: Dermal thickening with hyper pigmentation   Neurological:     Mental Status: He is alert and oriented to person, place, and time.     Motor: Weakness present.     Gait: Gait abnormal.  Psychiatric:        Mood and Affect: Mood normal.        Behavior: Behavior normal.        Thought Content: Thought content normal.        Judgment: Judgment normal.     BP (!) 156/89 (BP Location: Left Arm)   Pulse 89   Resp 16   Wt 225 lb (102.1 kg)   BMI 31.38 kg/m   Past Medical History:  Diagnosis Date  . Anemia    in past  . Anxiety   . Dementia (HCC)    able to sign own papers  . Depression   . Diabetes mellitus without complication (HCC)   . Full dentures    upper and lower  .  GERD (gastroesophageal reflux disease)   . Head trauma    admitted to [facility], "pint of blood removed from head", no deficits after  . Hypercholesteremia   . Hypertension   . 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)    none since release from hosp    Social History   Socioeconomic History  . Marital status: Married    Spouse name: Not on file  . Number of children: Not on file  . Years of education: Not on file  . Highest education level: Not on file  Occupational History  . Not on file  Tobacco Use  . Smoking status: Former Smoker    Quit date: 04/30/1966    Years since quitting: 54.1  . Smokeless tobacco: Never Used  Substance and Sexual Activity  . Alcohol use: No  . Drug use: No  . Sexual activity: Not Currently  Other Topics Concern  . Not on file  Social History Narrative  . Not on file   Social Determinants of Health   Financial Resource Strain: Not on file  Food Insecurity: Not on file  Transportation Needs: Not on file  Physical Activity: Not on file  Stress: Not on file  Social Connections: Not on file  Intimate Partner Violence: Not on file    Past Surgical History:  Procedure Laterality Date  . APPENDECTOMY     childhood  . BACK SURGERY  1963  . CATARACT EXTRACTION W/PHACO Left 09/15/2014   Procedure: CATARACT EXTRACTION PHACO AND INTRAOCULAR LENS PLACEMENT (IOC);  Surgeon: Lockie Mola, MD;  Location: [facility];  Service: Ophthalmology;  Laterality: Left;  DIABETIC PT WOULD LIKE TO HAVE ARRIVAL TIME LATE AM  . JOINT REPLACEMENT Left    knee  . PHOTOCOAGULATION WITH LASER Right 01/23/2016   Procedure: PHOTOCOAGULATION WITH LASER;  Surgeon: Sherald Hess, MD;  Location: [facility];  Service: Ophthalmology;  Laterality: Right;  DIABETIC - oral meds RIGHT Requests arrival 10AM or after    Family History  Problem Relation Age of Onset  . Heart attack Mother   . Diabetes Mother   . Heart attack Father   . Diabetes Sister      No Known Allergies  CBC Latest Ref Rng & Units 07/07/2013  WBC 3.8 - 10.6 x10 3/mm 3 8.0  Hemoglobin 13.0 - 18.0 g/dL 11.1(L)  Hematocrit 40.0 - 52.0 % 34.4(L)  Platelets 150 - 440 x10 3/mm 3 196      CMP     Component Value Date/Time   NA 140 07/07/2013 2030   K 3.0 (L) 07/07/2013 2030   CL 103 07/07/2013 2030   CO2 33 (H) 07/07/2013 2030   GLUCOSE 104 (H) 07/07/2013 2030   BUN 15 07/07/2013 2030   CREATININE 0.88 07/07/2013 2030   CALCIUM 9.1 07/07/2013 2030   PROT 6.8 07/07/2013 2030   ALBUMIN 3.1 (L) 07/07/2013 2030   AST 12 (L) 07/07/2013 2030   ALT 14 07/07/2013 2030   ALKPHOS 86 07/07/2013 2030   BILITOT 0.4 07/07/2013 2030   GFRNONAA >60 07/07/2013 2030   GFRAA >60 07/07/2013 2030     No results found.     Assessment & Plan:   1. Lymphedema Recommend:  No surgery or intervention at this point in time.    I have reviewed my previous discussion with the patient regarding swelling and why it causes symptoms.  Patient will continue wearing graduated compression stockings class 1 (20-30 mmHg) on a daily basis. The patient will  beginning wearing the stockings first thing in the morning and removing them in the evening. The patient is instructed specifically not to sleep in the stockings.    In addition, behavioral modification including several periods of elevation of the lower extremities during the day will be continued.  This was reviewed with the patient during the initial visit.  The patient will also continue routine exercise, especially walking on a daily basis as was discussed during the initial visit.    Despite conservative treatments of greater than 4 weeks including graduated compression therapy class 1 and behavioral modification including exercise and elevation the patient  has not obtained adequate control of the lymphedema.  The patient still has stage 3 lymphedema and therefore, I believe that a lymph pump should be added to improve the  control of the patient's lymphedema.  Additionally, a lymph pump is warranted because it will reduce the risk of cellulitis and ulceration in the future.  Patient should follow-up in six months    2. Essential hypertension Continue antihypertensive medications as already ordered, these medications have been reviewed and there are no changes at this time.   3. Hyperlipidemia, unspecified hyperlipidemia type  Continue statin as ordered and reviewed, no changes at this time    Current Outpatient Medications on File Prior to Visit  Medication Sig Dispense Refill  . ALPRAZolam (XANAX) 0.5 MG tablet Take 0.5 mg by mouth 2 (two) times daily. 1/2 tab, 1 PM    . atropine 1 % ophthalmic solution atropine 1 % eye drops    . carvedilol (COREG) 6.25 MG tablet Take 6.25 mg by mouth 2 (two) times daily with a meal.   10  . Cholecalciferol (VITAMIN D-3 PO) Take by mouth daily. 10 AM    . Difluprednate 0.05 % EMUL     . dorzolamide-timolol (COSOPT) 22.3-6.8 MG/ML ophthalmic solution dorzolamide 22.3 mg-timolol 6.8 mg/mL eye drops    . ferrous sulfate 325 (65 FE) MG tablet Take 325 mg by mouth daily with breakfast.    . finasteride (PROSCAR) 5 MG tablet Take 1 tablet (5 mg total) by mouth daily. 28 tablet 11  . furosemide (LASIX) 20 MG tablet Take 20 mg by mouth daily.    Marland Kitchen gabapentin (NEURONTIN) 300 MG capsule Take 300 mg by mouth 2 (two) times daily. 1 PM, 11 PM    . GLUCOSAMINE HCL PO Take by mouth.    Marland Kitchen HYDROcodone-acetaminophen (NORCO/VICODIN) 5-325 MG per tablet Take 1 tablet by mouth every 6 (six) hours as needed for moderate pain. 1 PM, 11PM    . Influenza Virus Vacc Split PF 0.5 ML SUSY Afluria 2014-2015(PF) 45 mcg (15 mcg x 3)/0.5 mL intramuscular syringe    . latanoprost (XALATAN) 0.005 % ophthalmic solution 1 drop at bedtime.    . metFORMIN (GLUCOPHAGE) 500 MG tablet Take 1,000 mg by mouth 2 (two) times daily with a meal. 1 PM, 11 PM    . Multiple Vitamin (MULTIVITAMIN) capsule Take 1 capsule  by mouth daily.    . nefazodone (SERZONE) 100 MG tablet Take by mouth.    . OIL OF OREGANO PO Take by mouth.    Marland Kitchen omeprazole (PRILOSEC) 40 MG capsule   5  . potassium chloride (K-DUR) 10 MEQ tablet Take 10 mEq by mouth daily.    . Probiotic Product (PROBIOTIC DAILY PO) Take by mouth.    . sertraline (ZOLOFT) 100 MG tablet Take 100 mg by mouth daily. 6 PM    . simvastatin (ZOCOR) 10 MG tablet Take 10 mg by mouth daily. 10 AM    . sitaGLIPtin (JANUVIA) 100 MG tablet Take 100 mg by mouth daily.    . sucralfate (CARAFATE) 1 G tablet Take 1 g by mouth 2 (two) times daily. 10 AM, 11 PM    . tamsulosin (FLOMAX) 0.4 MG CAPS capsule Take 1 capsule (0.4 mg total) by mouth daily after breakfast. 1 PM 30 capsule 11  . telmisartan (MICARDIS) 40 MG tablet   5  . triamcinolone ointment (KENALOG) 0.1 % triamcinolone acetonide 0.1 % topical ointment    . verapamil (COVERA HS) 240 MG (CO) 24 hr tablet Take 240 mg by mouth daily. 6 PM    . Vitamin D, Ergocalciferol, (DRISDOL) 1.25 MG (50000 UT) CAPS capsule   5   No current facility-administered medications on file prior to visit.    There are no Patient Instructions on file for this visit. No follow-ups on file.   Georgiana Spinner, NP

## 2020-06-10 ENCOUNTER — Ambulatory Visit (INDEPENDENT_AMBULATORY_CARE_PROVIDER_SITE_OTHER): Payer: Medicare PPO | Admitting: Vascular Surgery

## 2020-11-25 ENCOUNTER — Ambulatory Visit (INDEPENDENT_AMBULATORY_CARE_PROVIDER_SITE_OTHER): Payer: Medicare PPO | Admitting: Vascular Surgery

## 2020-11-25 ENCOUNTER — Other Ambulatory Visit: Payer: Self-pay

## 2020-11-25 VITALS — BP 148/76 | HR 72 | Resp 17 | Ht 71.0 in | Wt 222.0 lb

## 2020-11-25 DIAGNOSIS — E118 Type 2 diabetes mellitus with unspecified complications: Secondary | ICD-10-CM | POA: Diagnosis not present

## 2020-11-25 DIAGNOSIS — I10 Essential (primary) hypertension: Secondary | ICD-10-CM

## 2020-11-25 DIAGNOSIS — L97221 Non-pressure chronic ulcer of left calf limited to breakdown of skin: Secondary | ICD-10-CM

## 2020-11-25 DIAGNOSIS — I890 Lymphedema, not elsewhere classified: Secondary | ICD-10-CM

## 2020-11-25 DIAGNOSIS — I83022 Varicose veins of left lower extremity with ulcer of calf: Secondary | ICD-10-CM | POA: Diagnosis not present

## 2020-11-25 DIAGNOSIS — E785 Hyperlipidemia, unspecified: Secondary | ICD-10-CM

## 2020-11-25 NOTE — Assessment & Plan Note (Signed)
Symptoms are stable to improved.  His family is doing a good job of keeping his legs moisturized and elevated.  Nothing new at this point.  Recheck in 6 months.

## 2020-11-25 NOTE — Progress Notes (Signed)
MRN : [id number]  Tim Henry is a 87 y.o. ([birth year]) male who presents with chief complaint of  Chief Complaint  Patient presents with   Follow-up    6 months no studies  .  History of Present Illness: Patient returns today in follow up of his leg swelling and lymphedema.  His daughter is doing an excellent job of moisturizing his legs, making him elevate his legs, his legs are actually doing quite well.  He has marked stasis dermatitis changes bilaterally with previous history of longstanding ulcerations, but has not had an ulcer now in over 6 months.  He has some soreness in the legs and there is still some swelling, but it is much better.  There are a lot of other issues going on including dementia beginning in his wife, the loss of his oldest daughter, and difficulty such as this at home, but overall he is doing well.  Current Outpatient Medications  Medication Sig Dispense Refill   ALPRAZolam (XANAX) 0.5 MG tablet Take 0.5 mg by mouth 2 (two) times daily. 1/2 tab, 1 PM     atropine 1 % ophthalmic solution atropine 1 % eye drops     carvedilol (COREG) 6.25 MG tablet Take 6.25 mg by mouth 2 (two) times daily with a meal.   10   Cholecalciferol (VITAMIN D-3 PO) Take by mouth daily. 10 AM     Difluprednate 0.05 % EMUL      dorzolamide-timolol (COSOPT) 22.3-6.8 MG/ML ophthalmic solution dorzolamide 22.3 mg-timolol 6.8 mg/mL eye drops     ferrous sulfate 325 (65 FE) MG tablet Take 325 mg by mouth daily with breakfast.     finasteride (PROSCAR) 5 MG tablet Take 1 tablet (5 mg total) by mouth daily. 28 tablet 11   furosemide (LASIX) 20 MG tablet Take 20 mg by mouth daily.     gabapentin (NEURONTIN) 300 MG capsule Take 300 mg by mouth 2 (two) times daily. 1 PM, 11 PM     GLUCOSAMINE HCL PO Take by mouth.     latanoprost (XALATAN) 0.005 % ophthalmic solution 1 drop at bedtime.     metFORMIN (GLUCOPHAGE) 500 MG tablet Take 1,000 mg by mouth 2 (two) times daily with a meal. 1 PM, 11 PM      Multiple Vitamin (MULTIVITAMIN) capsule Take 1 capsule by mouth daily.     OIL OF OREGANO PO Take by mouth.     omeprazole (PRILOSEC) 40 MG capsule   5   potassium chloride (K-DUR) 10 MEQ tablet Take 10 mEq by mouth daily.     Probiotic Product (PROBIOTIC DAILY PO) Take by mouth.     sertraline (ZOLOFT) 100 MG tablet Take 100 mg by mouth daily. 6 PM     simvastatin (ZOCOR) 10 MG tablet Take 10 mg by mouth daily. 10 AM     sitaGLIPtin (JANUVIA) 100 MG tablet Take 100 mg by mouth daily.     sucralfate (CARAFATE) 1 G tablet Take 1 g by mouth 2 (two) times daily. 10 AM, 11 PM     tamsulosin (FLOMAX) 0.4 MG CAPS capsule Take 1 capsule (0.4 mg total) by mouth daily after breakfast. 1 PM 30 capsule 11   telmisartan (MICARDIS) 40 MG tablet   5   verapamil (COVERA HS) 240 MG (CO) 24 hr tablet Take 240 mg by mouth daily. 6 PM     Vitamin D, Ergocalciferol, (DRISDOL) 1.25 MG (50000 UT) CAPS capsule   5   HYDROcodone-acetaminophen (NORCO/VICODIN)  5-325 MG per tablet Take 1 tablet by mouth every 6 (six) hours as needed for moderate pain. 1 PM, 11PM (Patient not taking: Reported on 11/25/2020)     Influenza Virus Vacc Split PF 0.5 ML SUSY Afluria 2014-2015(PF) 45 mcg (15 mcg x 3)/0.5 mL intramuscular syringe (Patient not taking: Reported on 11/25/2020)     nefazodone (SERZONE) 100 MG tablet Take by mouth. (Patient not taking: Reported on 11/25/2020)     triamcinolone ointment (KENALOG) 0.1 % triamcinolone acetonide 0.1 % topical ointment (Patient not taking: Reported on 11/25/2020)     No current facility-administered medications for this visit.    Past Medical History:  Diagnosis Date   Anemia    in past   Anxiety    Dementia (HCC)    able to sign own papers   Depression    Diabetes mellitus without complication (HCC)    Full dentures    upper and lower   GERD (gastroesophageal reflux disease)    Head trauma    admitted to [facility], "pint of blood removed from head", no deficits after    Hypercholesteremia    Hypertension    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)    none since release from hosp    Past Surgical History:  Procedure Laterality Date   APPENDECTOMY     childhood   BACK SURGERY  1963   CATARACT EXTRACTION W/PHACO Left 09/15/2014   Procedure: CATARACT EXTRACTION PHACO AND INTRAOCULAR LENS PLACEMENT (IOC);  Surgeon: Lockie Mola, MD;  Location: [facility];  Service: Ophthalmology;  Laterality: Left;  DIABETIC PT WOULD LIKE TO HAVE ARRIVAL TIME LATE AM   JOINT REPLACEMENT Left    knee   PHOTOCOAGULATION WITH LASER Right 01/23/2016   Procedure: PHOTOCOAGULATION WITH LASER;  Surgeon: Sherald Hess, MD;  Location: [facility];  Service: Ophthalmology;  Laterality: Right;  DIABETIC - oral meds RIGHT Requests arrival 10AM or after     Social History   Tobacco Use   Smoking status: Former    Types: Cigarettes    Quit date: 04/30/1966    Years since quitting: 54.6   Smokeless tobacco: Never  Substance Use Topics   Alcohol use: No   Drug use: No      Family History  Problem Relation Age of Onset   Heart attack Mother    Diabetes Mother    Heart attack Father    Diabetes Sister      No Known Allergies   REVIEW OF SYSTEMS (Negative unless checked)   Constitutional: [] Weight loss  [] Fever  [] Chills Cardiac: [] Chest pain   [] Chest pressure   [] Palpitations   [] Shortness of breath when laying flat   [] Shortness of breath at rest   [] Shortness of breath with exertion. Vascular:  [] Pain in legs with walking   [] Pain in legs at rest   [] Pain in legs when laying flat   [] Claudication   [] Pain in feet when walking  [] Pain in feet at rest  [] Pain in feet when laying flat   [] History of DVT   [] Phlebitis   [x] Swelling in legs   [] Varicose veins   [x] Non-healing ulcers Pulmonary:   [] Uses home oxygen   [] Productive cough   [] Hemoptysis   [] Wheeze  [] COPD   [] Asthma Neurologic:  [] Dizziness  [] Blackouts   [] Seizures   [] History of  stroke   [] History of TIA  [] Aphasia   [] Temporary blindness   [] Dysphagia   [] Weakness or numbness in arms   [] Weakness or numbness  in legs Musculoskeletal:  [x] Arthritis   [] Joint swelling   [] Joint pain   [] Low back pain Hematologic:  [] Easy bruising  [] Easy bleeding   [] Hypercoagulable state   [] Anemic   Gastrointestinal:  [] Blood in stool   [] Vomiting blood  [] Gastroesophageal reflux/heartburn   [] Abdominal pain Genitourinary:  [x] Chronic kidney disease   [] Difficult urination  [] Frequent urination  [] Burning with urination   [] Hematuria Skin:  [] Rashes   [x] Ulcers   [x] Wounds Psychological:  [] History of anxiety   []  History of major depression.  Physical Examination  BP (!) 148/76 (BP Location: Right Arm)   Pulse 72   Resp 17   Ht 5\' 11"  (1.803 m)   Wt 222 lb (100.7 kg)   BMI 30.96 kg/m  Gen:  WD/WN, NAD Head: [place]/AT, No temporalis wasting. Ear/Nose/Throat: Hearing grossly intact, nares w/o erythema or drainage Eyes: Conjunctiva clear. Sclera non-icteric Neck: Supple.  Trachea midline Pulmonary:  Good air movement, no use of accessory muscles.  Cardiac: RRR, no JVD Vascular:  Vessel Right Left  Radial Palpable Palpable       Musculoskeletal: M/S 5/5 throughout.  No deformity or atrophy.  Marked stasis dermatitis changes are present bilaterally.  1+ bilateral lower extremity edema. Neurologic: Sensation grossly intact in extremities.  Symmetrical.  Speech is fluent.  Psychiatric: Judgment intact, Mood & affect appropriate for pt's clinical situation. Dermatologic: No rashes or ulcers noted.  No cellulitis or open wounds.      Labs No results found for this or any previous visit (from the past 2160 hour(s)).  Radiology No results found.  Assessment/Plan Essential hypertension blood pressure control important in reducing the progression of atherosclerotic disease. On appropriate oral medications.     Type 2 diabetes mellitus with complication (HCC) blood glucose  control important in reducing the progression of atherosclerotic disease. Also, involved in wound healing. On appropriate medications.     Hyperlipidemia lipid control important in reducing the progression of atherosclerotic disease. Continue statin therapy  Lymphedema Symptoms are stable to improved.  His family is doing a good job of keeping his legs moisturized and elevated.  Nothing new at this point.  Recheck in 6 months.    , MD  11/25/2020 2:28 PM    This note was created with Dragon medical transcription system.  Any errors from dictation are purely unintentional

## 2021-03-07 ENCOUNTER — Other Ambulatory Visit: Payer: Self-pay | Admitting: Urology

## 2021-03-07 DIAGNOSIS — N401 Enlarged prostate with lower urinary tract symptoms: Secondary | ICD-10-CM

## 2021-04-14 ENCOUNTER — Encounter: Payer: Self-pay | Admitting: Urology

## 2021-04-14 ENCOUNTER — Other Ambulatory Visit: Payer: Self-pay

## 2021-04-14 ENCOUNTER — Ambulatory Visit: Payer: Medicare PPO | Admitting: Urology

## 2021-04-14 ENCOUNTER — Ambulatory Visit: Payer: Self-pay | Admitting: Urology

## 2021-04-14 VITALS — BP 117/76 | HR 92 | Ht 71.0 in | Wt 230.0 lb

## 2021-04-14 DIAGNOSIS — N401 Enlarged prostate with lower urinary tract symptoms: Secondary | ICD-10-CM

## 2021-04-14 DIAGNOSIS — R350 Frequency of micturition: Secondary | ICD-10-CM

## 2021-04-14 LAB — URINALYSIS, COMPLETE
Bilirubin, UA: NEGATIVE
Glucose, UA: NEGATIVE
Ketones, UA: NEGATIVE
Leukocytes,UA: NEGATIVE
Nitrite, UA: NEGATIVE
Protein,UA: NEGATIVE
Specific Gravity, UA: 1.015 (ref 1.005–1.030)
Urobilinogen, Ur: 0.2 mg/dL (ref 0.2–1.0)
pH, UA: 5.5 (ref 5.0–7.5)

## 2021-04-14 LAB — MICROSCOPIC EXAMINATION: Bacteria, UA: NONE SEEN

## 2021-04-14 LAB — BLADDER SCAN AMB NON-IMAGING: Scan Result: 0

## 2021-04-14 MED ORDER — FINASTERIDE 5 MG PO TABS: 5.0000 mg | ORAL_TABLET | Freq: Every day | ORAL | 3 refills | Status: DC

## 2021-04-14 NOTE — Progress Notes (Signed)
04/14/2021 10:40 AM   Tim Henry 1927/05/09 [id number]  Referring provider: Josem Kaufmann, MD [address] [place] [address]  Chief Complaint  Patient presents with   Benign Prostatic Hypertrophy    Urologic history: 1.  BPH with lower urinary tract symptoms -Followed since 2015; initially treated with tamsulosin with significant improvement in his symptoms -Finasteride added for worsening LUTS   2.  Microhematuria evaluation September 2017 -Renal ultrasound bilateral simple cysts -Cystoscopy with trilobar enlargement/prominent hypervascularity   HPI: 87 y.o. male presents for annual follow-up.  Doing well since last visit No bothersome LUTS Remains on tamsulosin/finasteride Denies dysuria, gross hematuria Denies flank, abdominal or pelvic pain  PMH: Past Medical History:  Diagnosis Date   Anemia    in past   Anxiety    Dementia ([place])    able to sign own papers   Depression    Diabetes mellitus without complication ([place])    Full dentures    upper and lower   GERD (gastroesophageal reflux disease)    Head trauma    admitted to [facility], "pint of blood removed from head", no deficits after   Hypercholesteremia    Hypertension    Seizure after head injury ([place])    none since release from hosp    Surgical History: Past Surgical History:  Procedure Laterality Date   APPENDECTOMY     childhood   [place] EXTRACTION W/PHACO Left 09/15/2014   Procedure: CATARACT EXTRACTION PHACO AND INTRAOCULAR LENS PLACEMENT ([place]);  Surgeon: Leandrew Koyanagi, MD;  Location: [place];  Service: Ophthalmology;  Laterality: Left;  DIABETIC PT WOULD LIKE TO HAVE ARRIVAL TIME LATE AM   JOINT REPLACEMENT Left    knee   PHOTOCOAGULATION WITH LASER Right 01/23/2016   Procedure: PHOTOCOAGULATION WITH LASER;  Surgeon: Ronnell Freshwater, MD;  Location: [place];  Service: Ophthalmology;  Laterality: Right;  DIABETIC - oral  meds RIGHT Requests arrival 10AM or after    Home Medications:  Allergies as of 04/14/2021   No Known Allergies      Medication List        Accurate as of April 14, 2021 10:40 AM. If you have any questions, ask your nurse or doctor.          ALPRAZolam 0.5 MG tablet Commonly known as: XANAX Take 0.5 mg by mouth 2 (two) times daily. 1/2 tab, 1 PM   atropine 1 % ophthalmic solution atropine 1 % eye drops   carvedilol 6.25 MG tablet Commonly known as: COREG Take 6.25 mg by mouth 2 (two) times daily with a meal.   Difluprednate 0.05 % Emul   dorzolamide-timolol 22.3-6.8 MG/ML ophthalmic solution Commonly known as: COSOPT dorzolamide 22.3 mg-timolol 6.8 mg/mL eye drops   ferrous sulfate 325 (65 FE) MG tablet Take 325 mg by mouth daily with breakfast.   finasteride 5 MG tablet Commonly known as: PROSCAR TAKE 1 TABLET BY MOUTH ONCE DAILY.   furosemide 20 MG tablet Commonly known as: LASIX Take 20 mg by mouth daily.   gabapentin 300 MG capsule Commonly known as: NEURONTIN Take 300 mg by mouth 2 (two) times daily. 1 PM, 11 PM   GLUCOSAMINE HCL PO Take by mouth.   HYDROcodone-acetaminophen 5-325 MG tablet Commonly known as: NORCO/VICODIN Take 1 tablet by mouth every 6 (six) hours as needed for moderate pain. 1 PM, 11PM   Influenza Virus Vacc Split PF 0.5 ML [place]y Afluria 2014-2015(PF) 45 mcg (  15 mcg x 3)/0.5 mL intramuscular syringe   latanoprost 0.005 % ophthalmic solution Commonly known as: XALATAN 1 drop at bedtime.   metFORMIN 500 MG tablet Commonly known as: GLUCOPHAGE Take 1,000 mg by mouth 2 (two) times daily with a meal. 1 PM, 11 PM   multivitamin capsule Take 1 capsule by mouth daily.   nefazodone 100 MG tablet Commonly known as: SERZONE Take by mouth.   OIL OF OREGANO PO Take by mouth.   omeprazole 40 MG capsule Commonly known as: PRILOSEC   potassium chloride 10 MEQ tablet Commonly known as: KLOR-CON Take 10 mEq by mouth  daily.   PROBIOTIC DAILY PO Take by mouth.   sertraline 100 MG tablet Commonly known as: ZOLOFT Take 100 mg by mouth daily. 6 PM   simvastatin 10 MG tablet Commonly known as: ZOCOR Take 10 mg by mouth daily. 10 AM   sitaGLIPtin 100 MG tablet Commonly known as: JANUVIA Take 100 mg by mouth daily.   sucralfate 1 g tablet Commonly known as: CARAFATE Take 1 g by mouth 2 (two) times daily. 10 AM, 11 PM   tamsulosin 0.4 MG Caps capsule Commonly known as: FLOMAX Take 1 capsule (0.4 mg total) by mouth daily after breakfast. 1 PM   telmisartan 40 MG tablet Commonly known as: MICARDIS   triamcinolone ointment 0.1 % Commonly known as: KENALOG triamcinolone acetonide 0.1 % topical ointment   verapamil 240 MG (CO) 24 hr tablet Commonly known as: COVERA HS Take 240 mg by mouth daily. 6 PM   Vitamin D (Ergocalciferol) 1.25 MG (50000 UNIT) Caps capsule Commonly known as: DRISDOL   VITAMIN D-3 PO Take by mouth daily. 10 AM        Allergies: No Known Allergies  Family History: Family History  Problem Relation Age of Onset   Heart attack Mother    Diabetes Mother    Heart attack Father    Diabetes Sister     Social History:  reports that he quit smoking about 54 years ago. His smoking use included cigarettes. He has never used smokeless tobacco. He reports that he does not drink alcohol and does not use drugs.   Physical Exam: BP 117/76    Pulse 92    Ht 5\' 11"  (1.803 m)    Wt 230 lb (104.3 kg)    BMI 32.08 kg/m   Constitutional:  Alert and oriented, No acute distress. HEENT: [place] AT, moist mucus membranes.  Trachea midline, no masses. Cardiovascular: No clubbing, cyanosis, or edema. Respiratory: Normal respiratory effort, no increased work of breathing. Psychiatric: Normal mood and affect.   Assessment & Plan:    1. Benign prostatic hyperplasia with urinary frequency Stable Bladder scan PVR 0 mL Tamsulosin/finasteride refilled Continue annual  follow-up   , MD  [facility] [address] [place], [place] [phone]

## 2021-05-23 ENCOUNTER — Other Ambulatory Visit: Payer: Self-pay

## 2021-05-23 ENCOUNTER — Encounter (INDEPENDENT_AMBULATORY_CARE_PROVIDER_SITE_OTHER): Payer: Self-pay | Admitting: Vascular Surgery

## 2021-05-23 ENCOUNTER — Ambulatory Visit (INDEPENDENT_AMBULATORY_CARE_PROVIDER_SITE_OTHER): Payer: Medicare PPO | Admitting: Vascular Surgery

## 2021-05-23 VITALS — BP 113/71 | HR 73 | Resp 16 | Wt 232.4 lb

## 2021-05-23 DIAGNOSIS — I890 Lymphedema, not elsewhere classified: Secondary | ICD-10-CM | POA: Diagnosis not present

## 2021-05-23 DIAGNOSIS — E118 Type 2 diabetes mellitus with unspecified complications: Secondary | ICD-10-CM | POA: Diagnosis not present

## 2021-05-23 DIAGNOSIS — I10 Essential (primary) hypertension: Secondary | ICD-10-CM | POA: Diagnosis not present

## 2021-05-23 DIAGNOSIS — E785 Hyperlipidemia, unspecified: Secondary | ICD-10-CM

## 2021-05-23 NOTE — Assessment & Plan Note (Signed)
Symptoms are stable.  Continue current measures.  Recheck in 6 months.

## 2021-05-23 NOTE — Progress Notes (Signed)
MRN : [id number]  Tim Henry is a 88 y.o. (12-08-1927) male who presents with chief complaint of  Chief Complaint  Patient presents with   Follow-up    6 month follow up  .  History of Present Illness: Patient returns today in follow up of his lymphedema.  His leg swelling is under quite good control.  His skin is remained intact.  He is not having a lot of pain.  He basically is doing well except for having lost his wife and daughter in the last 6 months and this taking a toll on him.  From a leg and lymphedema standpoint, things are stable.  Current Outpatient Medications  Medication Sig Dispense Refill   ALPRAZolam (XANAX) 0.5 MG tablet Take 0.5 mg by mouth 2 (two) times daily. 1/2 tab, 1 PM     atropine 1 % ophthalmic solution atropine 1 % eye drops     carvedilol (COREG) 6.25 MG tablet Take 6.25 mg by mouth 2 (two) times daily with a meal.   10   Cholecalciferol (VITAMIN D-3 PO) Take by mouth daily. 10 AM     Difluprednate 0.05 % EMUL      dorzolamide-timolol (COSOPT) 22.3-6.8 MG/ML ophthalmic solution dorzolamide 22.3 mg-timolol 6.8 mg/mL eye drops     ferrous sulfate 325 (65 FE) MG tablet Take 325 mg by mouth daily with breakfast.     finasteride (PROSCAR) 5 MG tablet Take 1 tablet (5 mg total) by mouth daily. 90 tablet 3   furosemide (LASIX) 20 MG tablet Take 20 mg by mouth daily.     gabapentin (NEURONTIN) 300 MG capsule Take 300 mg by mouth 2 (two) times daily. 1 PM, 11 PM     GLUCOSAMINE HCL PO Take by mouth.     latanoprost (XALATAN) 0.005 % ophthalmic solution 1 drop at bedtime.     metFORMIN (GLUCOPHAGE) 500 MG tablet Take 1,000 mg by mouth 2 (two) times daily with a meal. 1 PM, 11 PM     Multiple Vitamin (MULTIVITAMIN) capsule Take 1 capsule by mouth daily.     OIL OF OREGANO PO Take by mouth.     omeprazole (PRILOSEC) 40 MG capsule   5   potassium chloride (K-DUR) 10 MEQ tablet Take 10 mEq by mouth daily.     Probiotic Product (PROBIOTIC DAILY PO) Take by mouth.      sertraline (ZOLOFT) 100 MG tablet Take 100 mg by mouth daily. 6 PM     simvastatin (ZOCOR) 10 MG tablet Take 10 mg by mouth daily. 10 AM     sitaGLIPtin (JANUVIA) 100 MG tablet Take 100 mg by mouth daily.     sucralfate (CARAFATE) 1 G tablet Take 1 g by mouth 2 (two) times daily. 10 AM, 11 PM     tamsulosin (FLOMAX) 0.4 MG CAPS capsule Take 1 capsule (0.4 mg total) by mouth daily after breakfast. 1 PM 30 capsule 11   telmisartan (MICARDIS) 40 MG tablet   5   verapamil (COVERA HS) 240 MG (CO) 24 hr tablet Take 240 mg by mouth daily. 6 PM     Vitamin D, Ergocalciferol, (DRISDOL) 1.25 MG (50000 UT) CAPS capsule   5   HYDROcodone-acetaminophen (NORCO/VICODIN) 5-325 MG per tablet Take 1 tablet by mouth every 6 (six) hours as needed for moderate pain. 1 PM, 11PM (Patient not taking: Reported on 11/25/2020)     Influenza Virus Vacc Split PF 0.5 ML SUSY Afluria 2014-2015(PF) 45 mcg (15 mcg x  3)/0.5 mL intramuscular syringe (Patient not taking: Reported on 11/25/2020)     nefazodone (SERZONE) 100 MG tablet Take by mouth. (Patient not taking: Reported on 11/25/2020)     triamcinolone ointment (KENALOG) 0.1 % triamcinolone acetonide 0.1 % topical ointment (Patient not taking: Reported on 11/25/2020)     No current facility-administered medications for this visit.    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 ([place])    none since release from hosp    Past Surgical History:  Procedure Laterality Date   APPENDECTOMY     childhood   [place] Left 09/15/2014   Procedure: CATARACT EXTRACTION PHACO  AND INTRAOCULAR LENS PLACEMENT ([place]);  Surgeon: Leandrew Koyanagi, MD;  Location: [facility];  Service: Ophthalmology;  Laterality: Left;  DIABETIC PT WOULD LIKE TO HAVE ARRIVAL TIME LATE AM   JOINT REPLACEMENT Left    knee   PHOTOCOAGULATION WITH LASER Right 01/23/2016   Procedure: PHOTOCOAGULATION WITH LASER;  Surgeon: Ronnell Freshwater, MD;  Location: [facility];  Service: Ophthalmology;  Laterality: Right;  DIABETIC - oral meds RIGHT Requests arrival 10AM or after     Social History   Tobacco Use   Smoking status: Former    Types: Cigarettes    Quit date: 04/30/1966    Years since quitting: 55.1   Smokeless tobacco: Never  Substance Use Topics   Alcohol use: No   Drug use: No      Family History  Problem Relation Age of Onset   Heart attack Mother    Diabetes Mother    Heart attack Father    Diabetes Sister      No Known Allergies  REVIEW OF SYSTEMS (Negative unless checked)   Constitutional: [] Weight loss  [] Fever  [] Chills Cardiac: [] Chest pain   [] Chest pressure   [] Palpitations   [] Shortness of breath when laying flat   [] Shortness of breath at rest   [] Shortness of breath with exertion. Vascular:  [] Pain in legs with walking   [] Pain in legs at rest   [] Pain in legs when laying flat   [] Claudication   [] Pain in feet when walking  [] Pain in feet at rest  [] Pain in feet when laying flat   [] History of DVT   [] Phlebitis   [x] Swelling in legs   [] Varicose veins   [x] Non-healing ulcers Pulmonary:   [] Uses home oxygen   [] Productive cough   [] Hemoptysis   [] Wheeze  [] COPD   [] Asthma Neurologic:  [] Dizziness  [] Blackouts   [] Seizures   [] History of stroke   [] History of TIA  [] Aphasia   [] Temporary blindness   [] Dysphagia   [] Weakness or numbness in arms   [] Weakness or numbness in legs Musculoskeletal:  [x] Arthritis   [] Joint swelling   [] Joint pain   [] Low back pain Hematologic:  [] Easy bruising  [] Easy bleeding   [] Hypercoagulable state    [] Anemic   Gastrointestinal:  [] Blood in stool   [] Vomiting blood  [] Gastroesophageal reflux/heartburn   []   Abdominal pain Genitourinary:  [x] Chronic kidney disease   [] Difficult urination  [] Frequent urination  [] Burning with urination   [] Hematuria Skin:  [] Rashes   [x] Ulcers   [x] Wounds Psychological:  [] History of anxiety   []  History of major depression.  Physical Examination  BP 113/71 (BP Location: Right Arm)    Pulse 73    Resp 16    Wt 232 lb 6.4 oz (105.4 kg)    BMI 32.41 kg/m  Gen:  WD/WN, NAD. Appears younger than stated age.  Head: [place]/AT, No temporalis wasting. Ear/Nose/Throat: Hearing grossly intact, nares w/o erythema or drainage Eyes: Conjunctiva clear. Sclera non-icteric Neck: Supple.  Trachea midline Pulmonary:  Good air movement, no use of accessory muscles.  Cardiac: irregular Vascular:  Vessel Right Left  Radial Palpable Palpable           Musculoskeletal: M/S 5/5 throughout.  No deformity or atrophy. Marked stasis changes, 1+ BLE edema. Neurologic: Sensation grossly intact in extremities.  Symmetrical.  Speech is fluent.  Psychiatric: Judgment intact, Mood & affect appropriate for pt's clinical situation. Dermatologic: No rashes or ulcers noted.  No cellulitis or open wounds.      Labs Recent Results (from the past 2160 hour(s))  Bladder Scan (Post Void Residual) in office     Status: None   Collection Time: 04/14/21 10:37 AM  Result Value Ref Range   Scan Result 0   Urinalysis, Complete     Status: Abnormal   Collection Time: 04/14/21 10:47 AM  Result Value Ref Range   Specific Gravity, UA 1.015 1.005 - 1.030   pH, UA 5.5 5.0 - 7.5   Color, UA Yellow Yellow   Appearance Ur Clear Clear   Leukocytes,UA Negative Negative   Protein,UA Negative Negative/Trace   Glucose, UA Negative Negative   Ketones, UA Negative Negative   RBC, UA Trace (A) Negative   Bilirubin, UA Negative Negative   Urobilinogen, Ur 0.2 0.2 - 1.0 mg/dL   Nitrite, UA Negative  Negative   Microscopic Examination See below:   Microscopic Examination     Status: None   Collection Time: 04/14/21 10:47 AM   Urine  Result Value Ref Range   WBC, UA 0-5 0 - 5 /hpf   RBC 0-2 0 - 2 /hpf   Epithelial Cells (non renal) 0-10 0 - 10 /hpf   Bacteria, UA None seen None seen/Few    Radiology No results found.  Assessment/Plan Essential hypertension blood pressure control important in reducing the progression of atherosclerotic disease. On appropriate oral medications.     Type 2 diabetes mellitus with complication (HCC) blood glucose control important in reducing the progression of atherosclerotic disease. Also, involved in wound healing. On appropriate medications.     Hyperlipidemia lipid control important in reducing the progression of atherosclerotic disease. Continue statin therapy  Lymphedema Symptoms are stable.  Continue current measures.  Recheck in 6 months.    Leotis Pain, MD  05/23/2021 2:19 PM    This note was created with Dragon medical transcription system.  Any errors from dictation are purely unintentional

## 2021-05-26 ENCOUNTER — Ambulatory Visit (INDEPENDENT_AMBULATORY_CARE_PROVIDER_SITE_OTHER): Payer: Medicare PPO | Admitting: Nurse Practitioner

## 2021-11-21 ENCOUNTER — Ambulatory Visit (INDEPENDENT_AMBULATORY_CARE_PROVIDER_SITE_OTHER): Payer: Medicare PPO | Admitting: Vascular Surgery

## 2021-11-28 ENCOUNTER — Encounter (INDEPENDENT_AMBULATORY_CARE_PROVIDER_SITE_OTHER): Payer: Self-pay | Admitting: Vascular Surgery

## 2021-11-28 ENCOUNTER — Ambulatory Visit (INDEPENDENT_AMBULATORY_CARE_PROVIDER_SITE_OTHER): Payer: Medicare PPO | Admitting: Vascular Surgery

## 2021-11-28 VITALS — BP 115/68 | HR 65 | Resp 16 | Wt 226.0 lb

## 2021-11-28 DIAGNOSIS — I10 Essential (primary) hypertension: Secondary | ICD-10-CM | POA: Diagnosis not present

## 2021-11-28 DIAGNOSIS — E118 Type 2 diabetes mellitus with unspecified complications: Secondary | ICD-10-CM | POA: Diagnosis not present

## 2021-11-28 DIAGNOSIS — I890 Lymphedema, not elsewhere classified: Secondary | ICD-10-CM | POA: Diagnosis not present

## 2021-11-28 DIAGNOSIS — E785 Hyperlipidemia, unspecified: Secondary | ICD-10-CM | POA: Diagnosis not present

## 2021-11-28 NOTE — Progress Notes (Signed)
MRN : [id number]  Tim Henry is a 88 y.o. (12/18/1927) male who presents with chief complaint of  Chief Complaint  Patient presents with   Follow-up    6 month follow up  .  History of Present Illness: Patient returns today in follow up of his lower extremity swelling and lymphedema.  He is doing a really good job of keeping his legs active even without walking and elevating them.  His daughter is doing an excellent job of keeping his legs moisturized with some elevation as well.  His legs look as good as they have in some years.  He feels well today without complaints  Current Outpatient Medications  Medication Sig Dispense Refill   ALPRAZolam (XANAX) 0.5 MG tablet Take 0.5 mg by mouth 2 (two) times daily. 1/2 tab, 1 PM     atropine 1 % ophthalmic solution atropine 1 % eye drops     carvedilol (COREG) 6.25 MG tablet Take 6.25 mg by mouth 2 (two) times daily with a meal.   10   Cholecalciferol (VITAMIN D-3 PO) Take by mouth daily. 10 AM     Difluprednate 0.05 % EMUL      dorzolamide-timolol (COSOPT) 22.3-6.8 MG/ML ophthalmic solution dorzolamide 22.3 mg-timolol 6.8 mg/mL eye drops     ferrous sulfate 325 (65 FE) MG tablet Take 325 mg by mouth daily with breakfast.     finasteride (PROSCAR) 5 MG tablet Take 1 tablet (5 mg total) by mouth daily. 90 tablet 3   furosemide (LASIX) 20 MG tablet Take 20 mg by mouth daily.     gabapentin (NEURONTIN) 300 MG capsule Take 300 mg by mouth 2 (two) times daily. 1 PM, 11 PM     GLUCOSAMINE HCL PO Take by mouth.     latanoprost (XALATAN) 0.005 % ophthalmic solution 1 drop at bedtime.     metFORMIN (GLUCOPHAGE) 500 MG tablet Take 500 mg by mouth daily with breakfast. 1 PM, 11 PM     Multiple Vitamin (MULTIVITAMIN) capsule Take 1 capsule by mouth daily.     OIL OF OREGANO PO Take by mouth.     omeprazole (PRILOSEC) 40 MG capsule   5   potassium chloride (K-DUR) 10 MEQ tablet Take 10 mEq by mouth daily.     Probiotic Product (PROBIOTIC DAILY PO)  Take by mouth.     sertraline (ZOLOFT) 100 MG tablet Take 100 mg by mouth daily. 6 PM     simvastatin (ZOCOR) 10 MG tablet Take 10 mg by mouth daily. 10 AM     sitaGLIPtin (JANUVIA) 100 MG tablet Take 100 mg by mouth daily.     sucralfate (CARAFATE) 1 G tablet Take 1 g by mouth 2 (two) times daily. 10 AM, 11 PM     tamsulosin (FLOMAX) 0.4 MG CAPS capsule Take 1 capsule (0.4 mg total) by mouth daily after breakfast. 1 PM 30 capsule 11   telmisartan (MICARDIS) 40 MG tablet   5   verapamil (COVERA HS) 240 MG (CO) 24 hr tablet Take 240 mg by mouth daily. 6 PM     Vitamin D, Ergocalciferol, (DRISDOL) 1.25 MG (50000 UT) CAPS capsule   5   HYDROcodone-acetaminophen (NORCO/VICODIN) 5-325 MG per tablet Take 1 tablet by mouth every 6 (six) hours as needed for moderate pain. 1 PM, 11PM (Patient not taking: Reported on 11/25/2020)     Influenza Virus Vacc Split PF 0.5 ML SUSY Afluria 2014-2015(PF) 45 mcg (15 mcg x 3)/0.5 mL intramuscular syringe (  Patient not taking: Reported on 11/25/2020)     nefazodone (SERZONE) 100 MG tablet Take by mouth. (Patient not taking: Reported on 11/25/2020)     triamcinolone ointment (KENALOG) 0.1 % triamcinolone acetonide 0.1 % topical ointment (Patient not taking: Reported on 11/25/2020)     No current facility-administered medications for this visit.    Past Medical History:  Diagnosis Date   Anemia    in past   Anxiety    Dementia (H[facility])    able to sign own papers   Depression    Diabetes mellitus without complication (HCC)    Full dentures    upper and lower   GERD (gastroesophageal reflux disease)    Head trauma    admitted to [facility], "pint of blood removed from head", no deficits after   Hypercholesteremia    Hypertension    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)    none since release from hosp    Past Surgical History:  Procedure Laterality Date   APPENDECTOMY     childhood   BACK SURGERY  1963   CATARACT EXTRACTION W/PHACO Left 09/15/2014   Procedure: CATARACT  EXTRACTION PHACO AND INTRAOCULAR LENS PLACEMENT ([facility]);  Surgeon: Lockie Mola, MD;  Location: [facility];  Service: Ophthalmology;  Laterality: Left;  DIABETIC PT WOULD LIKE TO HAVE ARRIVAL TIME LATE AM   JOINT REPLACEMENT Left    knee   PHOTOCOAGULATION WITH LASER Right 01/23/2016   Procedure: PHOTOCOAGULATION WITH LASER;  Surgeon: Sherald Hess, MD;  Location: [facility];  Service: Ophthalmology;  Laterality: Right;  DIABETIC - oral meds RIGHT Requests arrival 10AM or after     Social History   Tobacco Use   Smoking status: Former    Types: Cigarettes    Quit date: 04/30/1966    Years since quitting: 55.6   Smokeless tobacco: Never  Substance Use Topics   Alcohol use: No   Drug use: No      Family History  Problem Relation Age of Onset   Heart attack Mother    Diabetes Mother    Heart attack Father    Diabetes Sister      No Known Allergies   REVIEW OF SYSTEMS (Negative unless checked)   Constitutional: [] Weight loss  [] Fever  [] Chills Cardiac: [] Chest pain   [] Chest pressure   [] Palpitations   [] Shortness of breath when laying flat   [] Shortness of breath at rest   [] Shortness of breath with exertion. Vascular:  [] Pain in legs with walking   [] Pain in legs at rest   [] Pain in legs when laying flat   [] Claudication   [] Pain in feet when walking  [] Pain in feet at rest  [] Pain in feet when laying flat   [] History of DVT   [] Phlebitis   [x] Swelling in legs   [] Varicose veins   [x] Non-healing ulcers Pulmonary:   [] Uses home oxygen   [] Productive cough   [] Hemoptysis   [] Wheeze  [] COPD   [] Asthma Neurologic:  [] Dizziness  [] Blackouts   [] Seizures   [] History of stroke   [] History of TIA  [] Aphasia   [] Temporary blindness   [] Dysphagia   [] Weakness or numbness in arms   [] Weakness or numbness in legs Musculoskeletal:  [x] Arthritis   [] Joint swelling   [] Joint pain   [] Low back pain Hematologic:  [] Easy bruising  [] Easy bleeding    [] Hypercoagulable state   [] Anemic   Gastrointestinal:  [] Blood in stool   [] Vomiting blood  [] Gastroesophageal reflux/heartburn   [] Abdominal pain  Genitourinary:  [x] Chronic kidney disease   [] Difficult urination  [] Frequent urination  [] Burning with urination   [] Hematuria Skin:  [] Rashes   [x] Ulcers   [x] Wounds Psychological:  [] History of anxiety   []  History of major depression.  Physical Examination  BP 115/68 (BP Location: Right Arm)   Pulse 65   Resp 16   Wt 226 lb (102.5 kg)   BMI 31.52 kg/m  Gen:  WD/WN, NAD Head: [place]/AT, No temporalis wasting. Ear/Nose/Throat: Hearing grossly intact, nares w/o erythema or drainage Eyes: Conjunctiva clear. Sclera non-icteric Neck: Supple.  Trachea midline Pulmonary:  Good air movement, no use of accessory muscles.  Cardiac: irregular Vascular:  Vessel Right Left  Radial Palpable Palpable               Musculoskeletal: M/S 5/5 throughout.  No deformity or atrophy. Marked BLE stasis changes. Mild BLE edema. Neurologic: Sensation grossly intact in extremities.  Symmetrical.  Speech is fluent.  Psychiatric: Judgment intact, Mood & affect appropriate for pt's clinical situation. Dermatologic: No rashes or ulcers noted.  No cellulitis or open wounds.      Labs No results found for this or any previous visit (from the past 2160 hour(s)).  Radiology No results found.  Assessment/Plan Essential hypertension blood pressure control important in reducing the progression of atherosclerotic disease. On appropriate oral medications.     Type 2 diabetes mellitus with complication (HCC) blood glucose control important in reducing the progression of atherosclerotic disease. Also, involved in wound healing. On appropriate medications.     Hyperlipidemia lipid control important in reducing the progression of atherosclerotic disease. Continue statin therapy   Lymphedema Symptoms are stable.  Continue current measures.  Recheck in 6  months.    , MD  11/28/2021 1:34 PM    This note was created with Dragon medical transcription system.  Any errors from dictation are purely unintentional

## 2022-03-03 ENCOUNTER — Emergency Department (HOSPITAL_COMMUNITY): Payer: Medicare PPO

## 2022-03-03 ENCOUNTER — Other Ambulatory Visit: Payer: Self-pay

## 2022-03-03 ENCOUNTER — Inpatient Hospital Stay (HOSPITAL_COMMUNITY)
Admission: EM | Admit: 2022-03-03 | Discharge: 2022-03-06 | DRG: 689 | Disposition: A | Discharge status: Home Health | Payer: Medicare PPO | Attending: Family Medicine | Admitting: Family Medicine

## 2022-03-03 ENCOUNTER — Encounter (HOSPITAL_COMMUNITY): Payer: Self-pay

## 2022-03-03 DIAGNOSIS — E1165 Type 2 diabetes mellitus with hyperglycemia: Secondary | ICD-10-CM | POA: Diagnosis present

## 2022-03-03 DIAGNOSIS — Z6831 Body mass index (BMI) 31.0-31.9, adult: Secondary | ICD-10-CM

## 2022-03-03 DIAGNOSIS — D539 Nutritional anemia, unspecified: Secondary | ICD-10-CM | POA: Diagnosis present

## 2022-03-03 DIAGNOSIS — Z833 Family history of diabetes mellitus: Secondary | ICD-10-CM | POA: Diagnosis not present

## 2022-03-03 DIAGNOSIS — R1110 Vomiting, unspecified: Secondary | ICD-10-CM | POA: Diagnosis present

## 2022-03-03 DIAGNOSIS — N179 Acute kidney failure, unspecified: Secondary | ICD-10-CM | POA: Diagnosis present

## 2022-03-03 DIAGNOSIS — E782 Mixed hyperlipidemia: Secondary | ICD-10-CM | POA: Diagnosis present

## 2022-03-03 DIAGNOSIS — R531 Weakness: Secondary | ICD-10-CM | POA: Diagnosis present

## 2022-03-03 DIAGNOSIS — I878 Other specified disorders of veins: Secondary | ICD-10-CM | POA: Diagnosis present

## 2022-03-03 DIAGNOSIS — E441 Mild protein-calorie malnutrition: Secondary | ICD-10-CM | POA: Diagnosis present

## 2022-03-03 DIAGNOSIS — F32A Depression, unspecified: Secondary | ICD-10-CM | POA: Diagnosis present

## 2022-03-03 DIAGNOSIS — F0394 Unspecified dementia, unspecified severity, with anxiety: Secondary | ICD-10-CM | POA: Diagnosis present

## 2022-03-03 DIAGNOSIS — N400 Enlarged prostate without lower urinary tract symptoms: Secondary | ICD-10-CM | POA: Diagnosis not present

## 2022-03-03 DIAGNOSIS — I10 Essential (primary) hypertension: Secondary | ICD-10-CM | POA: Diagnosis present

## 2022-03-03 DIAGNOSIS — B9620 Unspecified Escherichia coli [E. coli] as the cause of diseases classified elsewhere: Secondary | ICD-10-CM | POA: Diagnosis present

## 2022-03-03 DIAGNOSIS — J690 Pneumonitis due to inhalation of food and vomit: Secondary | ICD-10-CM | POA: Diagnosis present

## 2022-03-03 DIAGNOSIS — F419 Anxiety disorder, unspecified: Secondary | ICD-10-CM | POA: Diagnosis not present

## 2022-03-03 DIAGNOSIS — Z7982 Long term (current) use of aspirin: Secondary | ICD-10-CM | POA: Diagnosis not present

## 2022-03-03 DIAGNOSIS — N401 Enlarged prostate with lower urinary tract symptoms: Secondary | ICD-10-CM | POA: Diagnosis present

## 2022-03-03 DIAGNOSIS — E8809 Other disorders of plasma-protein metabolism, not elsewhere classified: Secondary | ICD-10-CM | POA: Diagnosis present

## 2022-03-03 DIAGNOSIS — Z8249 Family history of ischemic heart disease and other diseases of the circulatory system: Secondary | ICD-10-CM | POA: Diagnosis not present

## 2022-03-03 DIAGNOSIS — N3000 Acute cystitis without hematuria: Principal | ICD-10-CM | POA: Diagnosis present

## 2022-03-03 DIAGNOSIS — F0393 Unspecified dementia, unspecified severity, with mood disturbance: Secondary | ICD-10-CM | POA: Diagnosis present

## 2022-03-03 DIAGNOSIS — Z87891 Personal history of nicotine dependence: Secondary | ICD-10-CM

## 2022-03-03 DIAGNOSIS — K219 Gastro-esophageal reflux disease without esophagitis: Secondary | ICD-10-CM | POA: Diagnosis present

## 2022-03-03 DIAGNOSIS — E669 Obesity, unspecified: Secondary | ICD-10-CM | POA: Diagnosis present

## 2022-03-03 DIAGNOSIS — E860 Dehydration: Secondary | ICD-10-CM | POA: Diagnosis present

## 2022-03-03 DIAGNOSIS — Z79899 Other long term (current) drug therapy: Secondary | ICD-10-CM

## 2022-03-03 DIAGNOSIS — N390 Urinary tract infection, site not specified: Secondary | ICD-10-CM | POA: Diagnosis not present

## 2022-03-03 DIAGNOSIS — E1169 Type 2 diabetes mellitus with other specified complication: Secondary | ICD-10-CM

## 2022-03-03 DIAGNOSIS — E46 Unspecified protein-calorie malnutrition: Secondary | ICD-10-CM | POA: Insufficient documentation

## 2022-03-03 DIAGNOSIS — Z7984 Long term (current) use of oral hypoglycemic drugs: Secondary | ICD-10-CM

## 2022-03-03 LAB — URINALYSIS, ROUTINE W REFLEX MICROSCOPIC
Bilirubin Urine: NEGATIVE
Glucose, UA: NEGATIVE mg/dL
Ketones, ur: NEGATIVE mg/dL
Nitrite: NEGATIVE
Protein, ur: 100 mg/dL — AB
Specific Gravity, Urine: 1.013 (ref 1.005–1.030)
WBC, UA: >50 WBC/hpf — ABNORMAL HIGH (ref 0–5)
pH: 5 (ref 5.0–8.0)

## 2022-03-03 LAB — CBC WITH DIFFERENTIAL/PLATELET
Abs Immature Granulocytes: 0.06 10*3/uL (ref 0.00–0.07)
Basophils Absolute: 0 10*3/uL (ref 0.0–0.1)
Basophils Relative: 0 %
Eosinophils Absolute: 0.1 10*3/uL (ref 0.0–0.5)
Eosinophils Relative: 0 %
HCT: 31.5 % — ABNORMAL LOW (ref 39.0–52.0)
Hemoglobin: 10 g/dL — ABNORMAL LOW (ref 13.0–17.0)
Immature Granulocytes: 1 %
Lymphocytes Relative: 9 %
Lymphs Abs: 1 10*3/uL (ref 0.7–4.0)
MCH: 31.9 pg (ref 26.0–34.0)
MCHC: 31.7 g/dL (ref 30.0–36.0)
MCV: 100.6 fL — ABNORMAL HIGH (ref 80.0–100.0)
Monocytes Absolute: 1.4 10*3/uL — ABNORMAL HIGH (ref 0.1–1.0)
Monocytes Relative: 12 %
Neutro Abs: 8.7 10*3/uL — ABNORMAL HIGH (ref 1.7–7.7)
Neutrophils Relative %: 78 %
Platelets: 160 10*3/uL (ref 150–400)
RBC: 3.13 MIL/uL — ABNORMAL LOW (ref 4.22–5.81)
RDW: 12.3 % (ref 11.5–15.5)
WBC: 11.3 10*3/uL — ABNORMAL HIGH (ref 4.0–10.5)
nRBC: 0 % (ref 0.0–0.2)

## 2022-03-03 LAB — COMPREHENSIVE METABOLIC PANEL
ALT: 22 U/L (ref 0–44)
AST: 30 U/L (ref 15–41)
Albumin: 3.2 g/dL — ABNORMAL LOW (ref 3.5–5.0)
Alkaline Phosphatase: 54 U/L (ref 38–126)
Anion gap: 7 (ref 5–15)
BUN: 37 mg/dL — ABNORMAL HIGH (ref 8–23)
CO2: 25 mmol/L (ref 22–32)
Calcium: 8.3 mg/dL — ABNORMAL LOW (ref 8.9–10.3)
Chloride: 103 mmol/L (ref 98–111)
Creatinine, Ser: 2.19 mg/dL — ABNORMAL HIGH (ref 0.61–1.24)
GFR, Estimated: 28 mL/min — ABNORMAL LOW (ref >60)
Glucose, Bld: 180 mg/dL — ABNORMAL HIGH (ref 70–99)
Potassium: 4.7 mmol/L (ref 3.5–5.1)
Sodium: 135 mmol/L (ref 135–145)
Total Bilirubin: 1.1 mg/dL (ref 0.3–1.2)
Total Protein: 6.9 g/dL (ref 6.5–8.1)

## 2022-03-03 LAB — LACTIC ACID, PLASMA
Lactic Acid, Venous: 1.3 mmol/L (ref 0.5–1.9)
Lactic Acid, Venous: 1.3 mmol/L (ref 0.5–1.9)

## 2022-03-03 LAB — GLUCOSE, CAPILLARY: Glucose-Capillary: 164 mg/dL — ABNORMAL HIGH (ref 70–99)

## 2022-03-03 MED ORDER — SODIUM CHLORIDE 0.9 % IV BOLUS
1000.0000 mL | Freq: Once | INTRAVENOUS | Status: AC
  Administered 2022-03-03: 1000 mL via INTRAVENOUS

## 2022-03-03 MED ORDER — INSULIN ASPART 100 UNIT/ML IJ SOLN: 0.0000 [IU] | Freq: Every day | INTRAMUSCULAR | Status: DC

## 2022-03-03 MED ORDER — ACETAMINOPHEN 325 MG PO TABS
650.0000 mg | ORAL_TABLET | Freq: Four times a day (QID) | ORAL | Status: DC | PRN
  Administered 2022-03-04 – 2022-03-06 (×2): 650 mg via ORAL
  Filled 2022-03-03 (×2): qty 2

## 2022-03-03 MED ORDER — ACETAMINOPHEN 650 MG RE SUPP: 650.0000 mg | Freq: Four times a day (QID) | RECTAL | Status: DC | PRN

## 2022-03-03 MED ORDER — ASPIRIN 81 MG PO TBEC
81.0000 mg | DELAYED_RELEASE_TABLET | Freq: Every day | ORAL | Status: DC
  Administered 2022-03-04 – 2022-03-06 (×3): 81 mg via ORAL
  Filled 2022-03-03 (×3): qty 1

## 2022-03-03 MED ORDER — ALPRAZOLAM 0.5 MG PO TABS
0.5000 mg | ORAL_TABLET | Freq: Two times a day (BID) | ORAL | Status: DC
  Administered 2022-03-03 – 2022-03-06 (×6): 0.5 mg via ORAL
  Filled 2022-03-03 (×6): qty 1

## 2022-03-03 MED ORDER — ONDANSETRON HCL 4 MG/2ML IJ SOLN: 4.0000 mg | Freq: Four times a day (QID) | INTRAMUSCULAR | Status: DC | PRN

## 2022-03-03 MED ORDER — CARVEDILOL 3.125 MG PO TABS
6.2500 mg | ORAL_TABLET | Freq: Two times a day (BID) | ORAL | Status: DC
  Administered 2022-03-04 – 2022-03-06 (×4): 6.25 mg via ORAL
  Filled 2022-03-03 (×5): qty 2

## 2022-03-03 MED ORDER — SERTRALINE HCL 50 MG PO TABS
100.0000 mg | ORAL_TABLET | Freq: Every day | ORAL | Status: DC
  Administered 2022-03-04 – 2022-03-06 (×3): 100 mg via ORAL
  Filled 2022-03-03 (×3): qty 2

## 2022-03-03 MED ORDER — SIMVASTATIN 20 MG PO TABS
10.0000 mg | ORAL_TABLET | Freq: Every day | ORAL | Status: DC
  Administered 2022-03-04 – 2022-03-06 (×3): 10 mg via ORAL
  Filled 2022-03-03 (×3): qty 1

## 2022-03-03 MED ORDER — TAMSULOSIN HCL 0.4 MG PO CAPS
0.4000 mg | ORAL_CAPSULE | Freq: Every day | ORAL | Status: DC
  Administered 2022-03-04 – 2022-03-06 (×3): 0.4 mg via ORAL
  Filled 2022-03-03 (×3): qty 1

## 2022-03-03 MED ORDER — FINASTERIDE 5 MG PO TABS
5.0000 mg | ORAL_TABLET | Freq: Every day | ORAL | Status: DC
  Administered 2022-03-04 – 2022-03-06 (×3): 5 mg via ORAL
  Filled 2022-03-03 (×3): qty 1

## 2022-03-03 MED ORDER — GLUCERNA SHAKE PO LIQD
237.0000 mL | Freq: Three times a day (TID) | ORAL | Status: DC
  Administered 2022-03-04 – 2022-03-06 (×8): 237 mL via ORAL

## 2022-03-03 MED ORDER — IRBESARTAN 150 MG PO TABS
150.0000 mg | ORAL_TABLET | Freq: Every day | ORAL | Status: DC
  Administered 2022-03-04 – 2022-03-06 (×3): 150 mg via ORAL
  Filled 2022-03-03 (×3): qty 1

## 2022-03-03 MED ORDER — VERAPAMIL HCL ER 240 MG PO TBCR
240.0000 mg | EXTENDED_RELEASE_TABLET | Freq: Every day | ORAL | Status: DC
  Administered 2022-03-04 – 2022-03-06 (×3): 240 mg via ORAL
  Filled 2022-03-03 (×4): qty 1

## 2022-03-03 MED ORDER — ONDANSETRON HCL 4 MG PO TABS: 4.0000 mg | ORAL_TABLET | Freq: Four times a day (QID) | ORAL | Status: DC | PRN

## 2022-03-03 MED ORDER — PANTOPRAZOLE SODIUM 40 MG PO TBEC
80.0000 mg | DELAYED_RELEASE_TABLET | Freq: Every day | ORAL | Status: DC
  Administered 2022-03-04 – 2022-03-06 (×3): 80 mg via ORAL
  Filled 2022-03-03 (×3): qty 2

## 2022-03-03 MED ORDER — SODIUM CHLORIDE 0.9 % IV SOLN
1.0000 g | INTRAVENOUS | Status: DC
  Administered 2022-03-04 – 2022-03-05 (×2): 1 g via INTRAVENOUS
  Filled 2022-03-03 (×2): qty 10

## 2022-03-03 MED ORDER — FERROUS SULFATE 325 (65 FE) MG PO TABS
325.0000 mg | ORAL_TABLET | Freq: Every day | ORAL | Status: DC
  Administered 2022-03-04 – 2022-03-06 (×3): 325 mg via ORAL
  Filled 2022-03-03 (×3): qty 1

## 2022-03-03 MED ORDER — INSULIN ASPART 100 UNIT/ML IJ SOLN
0.0000 [IU] | Freq: Three times a day (TID) | INTRAMUSCULAR | Status: DC
  Administered 2022-03-04: 1 [IU] via SUBCUTANEOUS
  Administered 2022-03-04: 2 [IU] via SUBCUTANEOUS
  Administered 2022-03-04 – 2022-03-05 (×4): 1 [IU] via SUBCUTANEOUS
  Administered 2022-03-06: 2 [IU] via SUBCUTANEOUS

## 2022-03-03 MED ORDER — SODIUM CHLORIDE 0.9 % IV SOLN
2.0000 g | Freq: Once | INTRAVENOUS | Status: AC
  Administered 2022-03-03: 2 g via INTRAVENOUS
  Filled 2022-03-03: qty 20

## 2022-03-03 MED ORDER — ENOXAPARIN SODIUM 30 MG/0.3ML IJ SOSY
30.0000 mg | PREFILLED_SYRINGE | INTRAMUSCULAR | Status: DC
  Administered 2022-03-04: 30 mg via SUBCUTANEOUS
  Filled 2022-03-03 (×2): qty 0.3

## 2022-03-03 MED ORDER — INSULIN GLARGINE-YFGN 100 UNIT/ML ~~LOC~~ SOLN
10.0000 [IU] | Freq: Every day | SUBCUTANEOUS | Status: DC
  Administered 2022-03-04 – 2022-03-05 (×2): 10 [IU] via SUBCUTANEOUS
  Filled 2022-03-03 (×3): qty 0.1

## 2022-03-03 NOTE — ED Notes (Signed)
Pt cleaned up by staff upon arrival. Pt had urine and feces on himself.

## 2022-03-03 NOTE — ED Notes (Signed)
Pt returned from CT Scan 

## 2022-03-03 NOTE — ED Triage Notes (Signed)
Pt brought in by [facility] for generalized weakness. Per EMS, family reported to them the pt became weak and slid down against the wall to lower himself to the floor.

## 2022-03-03 NOTE — H&P (Signed)
History and Physical    Patient: Tim Henry IOE:[id number] DOB: 02/15/1928 DOA: 03/03/2022 DOS: the patient was seen and examined on 03/04/2022 PCP: Aniceto Boss, MD  Patient coming from: Home  Chief Complaint:  Chief Complaint  Patient presents with   Weakness   HPI: Tim Henry is a 88 y.o. male with medical history significant of hypertension, hyperlipidemia T2DM, GERD, BPH who presents to the emergency department via EMS from home due to generalized weakness.  Patient felt weak while ambulating at home, he leaned against the wall and slid down to the floor, he also presented with urinary incontinence (unusual for him).  He complained of 2-day onset of occasional burning sensation on urination, but denies chest pain, shortness of breath, headache, fever, chills.   ED Course:  In the emergency department, he was hemodynamically stable, though BP was soft at 107/51.  Work-up in the ED showed macrocytic anemia and normal BMP except for BUN/creatinine 37/2.19 (creatinine on 02/06/2022 was 1.6), hyperglycemia, lactic acid x 2 was flat at 1.3, urinalysis was positive for large leukocytes and greater than 50 WBCs with many bacteria. CT abdomen and pelvis without contrast showed no acute abnormality in the abdomen or pelvis, but showed bronchial wall thickening and mucous plugging.  Bibasilar infiltrates may be due to atelectasis, infection or aspiration. CT of head without contrast showed no evidence of acute intracranial abnormality Pelvic x-ray showed no acute finding in the pelvis Chest x-ray showed no acute cardiopulmonary disease Patient was empirically started on IV ceftriaxone due to presumed UTI, IV hydration was provided. Hospitalist was asked to admit patient for further evaluation and management.  Review of Systems: Review of systems as noted in the HPI. All other systems reviewed and are negative.   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)    none since release from hosp   Past Surgical History:  Procedure Laterality Date   APPENDECTOMY     childhood   BACK SURGERY  1963   CATARACT EXTRACTION W/PHACO Left 09/15/2014   Procedure: CATARACT EXTRACTION PHACO AND INTRAOCULAR LENS PLACEMENT (IOC);  Surgeon: Lockie Mola, MD;  Location: [facility];  Service: Ophthalmology;  Laterality: Left;  DIABETIC PT WOULD LIKE TO HAVE ARRIVAL TIME LATE AM   JOINT REPLACEMENT Left    knee   PHOTOCOAGULATION WITH LASER Right 01/23/2016   Procedure: PHOTOCOAGULATION WITH LASER;  Surgeon: Sherald Hess, MD;  Location: [facility];  Service: Ophthalmology;  Laterality: Right;  DIABETIC - oral meds RIGHT Requests arrival 10AM or after    Social History:  reports that he quit smoking about 55 years ago. His smoking use included cigarettes. He has never used smokeless tobacco. He reports that he does not drink alcohol and does not use drugs.   No Known Allergies  Family History  Problem Relation Age of Onset   Heart attack Mother    Diabetes Mother    Heart attack Father    Diabetes Sister  Prior to Admission medications   Medication Sig Start Date End Date Taking? Authorizing Provider  ALPRAZolam Prudy Feeler) 0.5 MG tablet Take 0.5 mg by mouth 2 (two) times daily.   Yes [provider]  aspirin EC 81 MG tablet Take 81 mg by mouth daily. Swallow whole.   Yes [provider]  carvedilol (COREG) 6.25 MG tablet Take 6.25 mg by mouth 2 (two) times daily with a meal.  11/20/17  Yes [provider]  diphenhydrAMINE (BENADRYL) 25 MG tablet  Take 25 mg by mouth every 6 (six) hours as needed for allergies.   Yes [provider]  dorzolamide-timolol (COSOPT) 22.3-6.8 MG/ML ophthalmic solution Place 1 drop into both eyes 2 (two) times daily.   Yes [provider]  ferrous sulfate 325 (65 FE) MG tablet Take 325 mg by mouth daily with breakfast.   Yes [provider]  finasteride (PROSCAR) 5 MG tablet Take 1 tablet (5 mg total) by mouth daily. 04/14/21  Yes Stoioff, Verna Czech, MD  furosemide (LASIX) 20 MG tablet Take 20 mg by mouth 2 (two) times daily.   Yes [provider]  gabapentin (NEURONTIN) 300 MG capsule Take 300 mg by mouth 2 (two) times daily. 1 PM, 11 PM   Yes [provider]  Glucosamine 500 MG CAPS Take 3 capsules by mouth every evening.   Yes [provider]  GLUCOSAMINE HCL PO Take by mouth.   Yes [provider]  latanoprost (XALATAN) 0.005 % ophthalmic solution Place 1 drop into both eyes at bedtime.   Yes [provider]  metFORMIN (GLUCOPHAGE) 500 MG tablet Take 500 mg by mouth daily with breakfast. 1 PM, 11 PM   Yes [provider]  Multiple Vitamin (MULTIVITAMIN) capsule Take 1 capsule by mouth daily.   Yes [provider]  OIL OF OREGANO PO Take by mouth.   Yes [provider]  omeprazole (PRILOSEC) 40 MG capsule Take 40 mg by mouth daily. 02/07/18  Yes [provider]  PHOSPHATIDYL CHOLINE PO Take 2 tablets by mouth daily.   Yes [provider]  potassium chloride (K-DUR) 10 MEQ tablet Take 20 mEq by mouth daily.   Yes [provider]  Probiotic Product (PROBIOTIC DAILY PO) Take by mouth.   Yes [provider]  sertraline (ZOLOFT) 100 MG tablet Take 100 mg by mouth daily.   Yes [provider]  simvastatin (ZOCOR) 10 MG tablet Take 10 mg by mouth daily. 10 AM   Yes [provider]  sitaGLIPtin (JANUVIA) 100 MG tablet Take 100 mg by mouth daily.   Yes [provider]  sucralfate (CARAFATE) 1 G tablet Take 1 g by mouth 2 (two) times daily. 10 AM, 11 PM   Yes [provider]  tamsulosin (FLOMAX) 0.4 MG CAPS capsule Take 1 capsule (0.4 mg total) by mouth daily after breakfast. 1 PM 04/13/20  Yes Stoioff, Scott C, MD  telmisartan (MICARDIS) 40 MG tablet Take 20 mg by mouth daily. 11/20/17  Yes [provider]  verapamil (COVERA HS) 240 MG (CO) 24 hr tablet Take 240 mg by mouth daily.   Yes [provider]  Vitamin D, Ergocalciferol, (DRISDOL) 1.25 MG (50000 UT) CAPS capsule Take 50,000 Units by mouth every 7 (seven) days. Wednesday 02/07/18  Yes [provider]  Influenza Virus Vacc Split PF 0.5 ML SUSY Afluria 2014-2015(PF) 45 mcg (15 mcg x 3)/0.5 mL intramuscular syringe Patient not taking: Reported on 11/25/2020  [provider]    Physical Exam: BP 126/61 (BP Location: Right Arm)   Pulse 73   Temp 98.2 F (36.8 C) (Oral)   Resp 18   Ht 5\' 11"  (1.803 m)   Wt 102.1 kg   SpO2 100%   BMI 31.38 kg/m   General: 88 y.o. year-old male well developed well nourished in no acute distress.  Alert and oriented x3. HEENT: NCAT, EOMI, dry mucous membrane Neck: Supple, trachea medial Cardiovascular: Regular rate and rhythm with no rubs or gallops.  No thyromegaly or JVD noted.  No lower extremity edema. 2/4 pulses in all 4 extremities. Respiratory: Clear to auscultation with no wheezes or rales. Good inspiratory effort. Abdomen: Soft, nontender nondistended with normal bowel sounds x4 quadrants. Muskuloskeletal: Bilateral chronic venous stasis with abrasions.  No cyanosis or clubbing  noted bilaterally Neuro: CN II-XII intact, strength 5/5 x 4, sensation, reflexes intact Skin: No ulcerative lesions noted or rashes Psychiatry: Mood is appropriate for condition and setting          Labs on Admission:  Basic Metabolic Panel: Recent Labs  Lab 03/03/22 1945  NA 135  K 4.7  CL 103  CO2 25  GLUCOSE  180*  BUN 37*  CREATININE 2.19*  CALCIUM 8.3*   Liver Function Tests: Recent Labs  Lab 03/03/22 1945  AST 30  ALT 22  ALKPHOS 54  BILITOT 1.1  PROT 6.9  ALBUMIN 3.2*   No results for input(s): "LIPASE", "AMYLASE" in the last 168 hours. No results for input(s): "AMMONIA" in the last 168 hours. CBC: Recent Labs  Lab 03/03/22 1945  WBC 11.3*  NEUTROABS 8.7*  HGB 10.0*  HCT 31.5*  MCV 100.6*  PLT 160   Cardiac Enzymes: No results for input(s): "CKTOTAL", "CKMB", "CKMBINDEX", "TROPONINI" in the last 168 hours.  BNP (last 3 results) No results for input(s): "BNP" in the last 8760 hours.  ProBNP (last 3 results) No results for input(s): "PROBNP" in the last 8760 hours.  CBG: Recent Labs  Lab 03/03/22 2323  GLUCAP 164*    Radiological Exams on Admission: CT ABDOMEN PELVIS WO CONTRAST  Result Date: 03/03/2022 CLINICAL DATA:  Abdominal pain; generalized weakness EXAM: CT ABDOMEN AND PELVIS WITHOUT CONTRAST TECHNIQUE: Multidetector CT imaging of the abdomen and pelvis was performed following the standard protocol without IV contrast. RADIATION DOSE REDUCTION: This exam was performed according to the departmental dose-optimization program which includes automated exposure control, adjustment of the mA and/or kV according to patient size and/or use of iterative reconstruction technique. COMPARISON:  None Available. FINDINGS: Lower chest: Normal heart size. No pericardial effusion. Coronary artery and mitral annular calcification. Bronchial wall thickening and mucous plugging in the lower lungs. Bibasilar atelectasis or infiltrates. Hepatobiliary: No focal hepatic abnormality. Large gallstone in the gallbladder. No biliary dilation. Pancreas: Unremarkable. Spleen: Unremarkable. Adrenals/Urinary Tract: Unremarkable adrenal glands. Bilateral low-density cystic lesions in the kidneys compatible with benign cysts. No follow-up required. No urinary calculi or hydronephrosis.  Stomach/Bowel: Small hiatal hernia. Unremarkable stomach. Normal caliber large and small bowel. Advanced colonic diverticulosis without evidence of diverticulitis. Vascular/Lymphatic: Aortic atherosclerosis. No enlarged abdominal or pelvic lymph nodes. Reproductive: Unremarkable. Other: No free intraperitoneal fluid or air. Fat containing right-greater-than-left inguinal hernias. Umbilical hernia containing fat and a knuckle of nonobstructed small bowel. Musculoskeletal: Demineralization. Thoracolumbar spondylosis and degenerative disc disease. Advanced left hip osteoarthritis. No acute osseous abnormality IMPRESSION: 1. No acute abnormality in the abdomen or pelvis. 2. Bronchial wall thickening and mucous plugging. Bibasilar infiltrates may  be due to atelectasis, infection, or aspiration. 3. Advanced Colonic diverticulosis without diverticulitis. 4.  Aortic Atherosclerosis (ICD10-I70.0). Electronically Signed   By: Placido Sou M.D.   On: 03/03/2022 21:05   CT Head Wo Contrast  Result Date: 03/03/2022 CLINICAL DATA:  Generalized weakness EXAM: CT HEAD WITHOUT CONTRAST TECHNIQUE: Contiguous axial images were obtained from the base of the skull through the vertex without intravenous contrast. RADIATION DOSE REDUCTION: This exam was performed according to the departmental dose-optimization program which includes automated exposure control, adjustment of the mA and/or kV according to patient size and/or use of iterative reconstruction technique. COMPARISON:  07/07/2013 FINDINGS: Brain: No evidence of acute infarction, hemorrhage, hydrocephalus, extra-axial collection or mass lesion/mass effect. Cortical atrophy. Mild subcortical white matter and periventricular small vessel ischemic changes. Vascular: Mild intracranial atherosclerosis. Skull: Normal. Negative for fracture or focal lesion. Sinuses/Orbits: The visualized paranasal sinuses are essentially clear. The mastoid air cells are unopacified. Other: None.  IMPRESSION: No evidence of acute intracranial abnormality. Atrophy with small vessel ischemic changes. Electronically Signed   By: Julian Hy M.D.   On: 03/03/2022 21:04   DG Pelvis Portable  Result Date: 03/03/2022 CLINICAL DATA:  88 year old male presenting for evaluation of weakness post fall. EXAM: PORTABLE PELVIS 1-2 VIEWS COMPARISON:  None available. FINDINGS: No evidence of fracture or diastasis about the pelvis. Moderate to marked LEFT hip degenerative changes. Degenerative changes in the lumbar spine. Osteopenia. IMPRESSION: 1. No acute finding in the pelvis. 2. Moderate to marked LEFT hip degenerative changes. 3. Degenerative changes incidentally noted in the lumbar spine. Electronically Signed   By: Zetta Bills M.D.   On: 03/03/2022 19:54   DG Chest Port 1 View  Result Date: 03/03/2022 CLINICAL DATA:  A 88 year old male presents for evaluation of generalized weakness post fall. EXAM: PORTABLE CHEST 1 VIEW COMPARISON:  None available. FINDINGS: Cardiomediastinal contours and hilar structures are normal. Lung volumes are mildly low. No lobar consolidation. No sign of pneumothorax. No gross pleural effusion. On limited assessment no acute skeletal findings. IMPRESSION: 1. No acute cardiopulmonary disease. Electronically Signed   By: Zetta Bills M.D.   On: 03/03/2022 19:52    EKG: I independently viewed the EKG done and my findings are as followed: Normal sinus rhythm at a rate of 71 bpm  Assessment/Plan Present on Admission:  UTI (urinary tract infection)  Essential hypertension  GERD (gastroesophageal reflux disease)  BPH (benign prostatic hyperplasia)  Depression  Anxiety  Principal Problem:   Generalized weakness Active Problems:   Essential hypertension   BPH (benign prostatic hyperplasia)   Anxiety   Depression   GERD (gastroesophageal reflux disease)   UTI (urinary tract infection)   AKI (acute kidney injury) ([place])   Type 2 diabetes mellitus with  hyperglycemia (HCC)   Dehydration   Hypoalbuminemia due to protein-calorie malnutrition (HCC)   Chronic venous stasis   Obesity (BMI 30-39.9)  Generalized weakness secondary to multifactorial This may be due to anemia, dehydration etc Continue ferrous sulfate Provide IV hydration Continue fall precaution Continue PT/OT eval and treat  Presumed UTI POA Patient complained of 2-day onset of burning sensation on urination Urinalysis suggestive of UTI Patient was already started on IV ceftriaxone, we shall continue same at this time Urine culture pending  Possible Aspiration pneumonia vs atelectasis CT abdomen and pelvis was suggestive of infection or aspiration, however, patient denies, shortness of breath, or any other respiratory symptoms Procalcitonin will be checked prior to starting antibiotics on this Continue incentive telemetry  T2DM  with uncontrolled hyperglycemia Continue Semglee 10 units and adjust dose accordingly Continue ISS and hypoglycemic protocol  Dehydration Continue gentle hydration  Hypoalbuminemia secondary to mild protein calorie malnutrition Protein supplement will be provided  Acute kidney injury Renally adjust medications, avoid nephrotoxic agents/dehydration/hypotension   Chronic venous stasis with superficial abrasions in bilateral extremities Stable, continue wound care  Essential hypertension Continue Coreg, Avapro, verapamil  Mixed hyperlipidemia Continue Zocor  GERD Continue Protonix  BPH Continue Flomax and Proscar  Anxiety and depression Continue Xanax and Zoloft  Obesity (BMI 31.38) Diet and lifestyle education  DVT prophylaxis: Lovenox  Code Status: Full code  Family Communication: None at bedside  Consults: None  Severity of Illness: The appropriate patient status for this patient is INPATIENT. Inpatient status is judged to be reasonable and necessary in order to provide the required intensity of service to ensure  the patient's safety. The patient's presenting symptoms, physical exam findings, and initial radiographic and laboratory data in the context of their chronic comorbidities is felt to place them at high risk for further clinical deterioration. Furthermore, it is not anticipated that the patient will be medically stable for discharge from the hospital within 2 midnights of admission.   * I certify that at the point of admission it is my clinical judgment that the patient will require inpatient hospital care spanning beyond 2 midnights from the point of admission due to high intensity of service, high risk for further deterioration and high frequency of surveillance required.*  Author: Frankey Shown, DO 03/04/2022 12:30 AM  For on call review [email or web address].

## 2022-03-03 NOTE — ED Provider Notes (Signed)
[facility] EMERGENCY DEPARTMENT Provider Note   CSN: [id number] Arrival date & time: 03/03/22  1843     History {Add pertinent medical, surgical, social history, OB history to HPI:1} Chief Complaint  Patient presents with   Weakness    Tim Henry is a 88 y.o. male.  Patient has a history of diabetes hypertension.  He has been weak today and vomited once and slid down to the floor.   Weakness      Home Medications Prior to Admission medications   Medication Sig Start Date End Date Taking? Authorizing Provider  ALPRAZolam Prudy Feeler) 0.5 MG tablet Take 0.5 mg by mouth 2 (two) times daily.   Yes [provider]  aspirin EC 81 MG tablet Take 81 mg by mouth daily. Swallow whole.   Yes [provider]  carvedilol (COREG) 6.25 MG tablet Take 6.25 mg by mouth 2 (two) times daily with a meal.  11/20/17  Yes [provider]  diphenhydrAMINE (BENADRYL) 25 MG tablet Take 25 mg by mouth every 6 (six) hours as needed for allergies.   Yes [provider]  dorzolamide-timolol (COSOPT) 22.3-6.8 MG/ML ophthalmic solution Place 1 drop into both eyes 2 (two) times daily.   Yes [provider]  ferrous sulfate 325 (65 FE) MG tablet Take 325 mg by mouth daily with breakfast.   Yes [provider]  finasteride (PROSCAR) 5 MG tablet Take 1 tablet (5 mg total) by mouth daily. 04/14/21  Yes Stoioff, Verna Czech, MD  furosemide (LASIX) 20 MG tablet Take 20 mg by mouth 2 (two) times daily.   Yes [provider]  gabapentin (NEURONTIN) 300 MG capsule Take 300 mg by mouth 2 (two) times daily. 1 PM, 11 PM   Yes [provider]  Glucosamine 500 MG CAPS Take 3 capsules by mouth every evening.   Yes [provider]  GLUCOSAMINE HCL PO Take by mouth.   Yes [provider]  latanoprost (XALATAN) 0.005 % ophthalmic solution Place 1 drop into both eyes at bedtime.   Yes [provider]  metFORMIN (GLUCOPHAGE) 500 MG  tablet Take 500 mg by mouth daily with breakfast. 1 PM, 11 PM   Yes [provider]  Multiple Vitamin (MULTIVITAMIN) capsule Take 1 capsule by mouth daily.   Yes [provider]  OIL OF OREGANO PO Take by mouth.   Yes [provider]  omeprazole (PRILOSEC) 40 MG capsule Take 40 mg by mouth daily. 02/07/18  Yes [provider]  PHOSPHATIDYL CHOLINE PO Take 2 tablets by mouth daily.   Yes [provider]  potassium chloride (K-DUR) 10 MEQ tablet Take 20 mEq by mouth daily.   Yes [provider]  Probiotic Product (PROBIOTIC DAILY PO) Take by mouth.   Yes [provider]  sertraline (ZOLOFT) 100 MG tablet Take 100 mg by mouth daily.   Yes [provider]  simvastatin (ZOCOR) 10 MG tablet Take 10 mg by mouth daily. 10 AM   Yes [provider]  sitaGLIPtin (JANUVIA) 100 MG tablet Take 100 mg by mouth daily.   Yes [provider]  sucralfate (CARAFATE) 1 G tablet Take 1 g by mouth 2 (two) times daily. 10 AM, 11 PM   Yes [provider]  tamsulosin (FLOMAX) 0.4 MG CAPS capsule Take 1 capsule (0.4 mg total) by mouth daily after breakfast. 1 PM 04/13/20  Yes Stoioff, Scott C, MD  telmisartan (MICARDIS) 40 MG tablet Take 20 mg by mouth daily.  11/20/17  Yes [provider]  verapamil (COVERA HS) 240 MG (CO) 24 hr tablet Take 240 mg by mouth daily.   Yes [provider]  Vitamin D, Ergocalciferol, (DRISDOL) 1.25 MG (50000 UT) CAPS capsule Take 50,000 Units by mouth every 7 (seven) days. Wednesday 02/07/18  Yes [provider]  Influenza Virus Vacc Split PF 0.5 ML SUSY Afluria 2014-2015(PF) 45 mcg (15 mcg x 3)/0.5 mL intramuscular syringe Patient not taking: Reported on 11/25/2020    [provider]      Allergies    Patient has no known allergies.    Review of Systems   Review of Systems  Neurological:  Positive for weakness.    Physical Exam Updated Vital Signs BP (!)  116/53   Pulse 72   Temp 98.6 F (37 C) (Oral)   Resp 20   Ht 5\' 11"  (1.803 m)   Wt 102.1 kg   SpO2 94%   BMI 31.38 kg/m  Physical Exam  ED Results / Procedures / Treatments   Labs (all labs ordered are listed, but only abnormal results are displayed) Labs Reviewed  CBC WITH DIFFERENTIAL/PLATELET - Abnormal; Notable for the following components:      Result Value   WBC 11.3 (*)    RBC 3.13 (*)    Hemoglobin 10.0 (*)    HCT 31.5 (*)    MCV 100.6 (*)    Neutro Abs 8.7 (*)    Monocytes Absolute 1.4 (*)    All other components within normal limits  COMPREHENSIVE METABOLIC PANEL - Abnormal; Notable for the following components:   Glucose, Bld 180 (*)    BUN 37 (*)    Creatinine, Ser 2.19 (*)    Calcium 8.3 (*)    Albumin 3.2 (*)    GFR, Estimated 28 (*)    All other components within normal limits  URINALYSIS, ROUTINE W REFLEX MICROSCOPIC - Abnormal; Notable for the following components:   Color, Urine AMBER (*)    APPearance CLOUDY (*)    Hgb urine dipstick MODERATE (*)    Protein, ur 100 (*)    Leukocytes,Ua LARGE (*)    WBC, UA >50 (*)    Bacteria, UA MANY (*)    Non Squamous Epithelial 0-5 (*)    All other components within normal limits  URINE CULTURE  LACTIC ACID, PLASMA  LACTIC ACID, PLASMA    EKG None  Radiology CT ABDOMEN PELVIS WO CONTRAST  Result Date: 03/03/2022 CLINICAL DATA:  Abdominal pain; generalized weakness EXAM: CT ABDOMEN AND PELVIS WITHOUT CONTRAST TECHNIQUE: Multidetector CT imaging of the abdomen and pelvis was performed following the standard protocol without IV contrast. RADIATION DOSE REDUCTION: This exam was performed according to the departmental dose-optimization program which includes automated exposure control, adjustment of the mA and/or kV according to patient size and/or use of iterative reconstruction technique. COMPARISON:  None Available. FINDINGS: Lower chest: Normal heart size. No pericardial effusion. Coronary artery and  mitral annular calcification. Bronchial wall thickening and mucous plugging in the lower lungs. Bibasilar atelectasis or infiltrates. Hepatobiliary: No focal hepatic abnormality. Large gallstone in the gallbladder. No biliary dilation. Pancreas: Unremarkable. Spleen: Unremarkable. Adrenals/Urinary Tract: Unremarkable adrenal glands. Bilateral low-density cystic lesions in the kidneys compatible with benign cysts. No follow-up required. No urinary calculi or hydronephrosis. Stomach/Bowel: Small hiatal hernia. Unremarkable stomach. Normal caliber large and small bowel. Advanced colonic diverticulosis without evidence of diverticulitis. Vascular/Lymphatic: Aortic atherosclerosis. No enlarged abdominal or pelvic lymph nodes. Reproductive: Unremarkable. Other: No free  intraperitoneal fluid or air. Fat containing right-greater-than-left inguinal hernias. Umbilical hernia containing fat and a knuckle of nonobstructed small bowel. Musculoskeletal: Demineralization. Thoracolumbar spondylosis and degenerative disc disease. Advanced left hip osteoarthritis. No acute osseous abnormality IMPRESSION: 1. No acute abnormality in the abdomen or pelvis. 2. Bronchial wall thickening and mucous plugging. Bibasilar infiltrates may be due to atelectasis, infection, or aspiration. 3. Advanced Colonic diverticulosis without diverticulitis. 4.  Aortic Atherosclerosis (ICD10-I70.0). Electronically Signed   By: Placido Sou M.D.   On: 03/03/2022 21:05   CT Head Wo Contrast  Result Date: 03/03/2022 CLINICAL DATA:  Generalized weakness EXAM: CT HEAD WITHOUT CONTRAST TECHNIQUE: Contiguous axial images were obtained from the base of the skull through the vertex without intravenous contrast. RADIATION DOSE REDUCTION: This exam was performed according to the departmental dose-optimization program which includes automated exposure control, adjustment of the mA and/or kV according to patient size and/or use of iterative reconstruction  technique. COMPARISON:  07/07/2013 FINDINGS: Brain: No evidence of acute infarction, hemorrhage, hydrocephalus, extra-axial collection or mass lesion/mass effect. Cortical atrophy. Mild subcortical white matter and periventricular small vessel ischemic changes. Vascular: Mild intracranial atherosclerosis. Skull: Normal. Negative for fracture or focal lesion. Sinuses/Orbits: The visualized paranasal sinuses are essentially clear. The mastoid air cells are unopacified. Other: None. IMPRESSION: No evidence of acute intracranial abnormality. Atrophy with small vessel ischemic changes. Electronically Signed   By: Julian Hy M.D.   On: 03/03/2022 21:04   DG Pelvis Portable  Result Date: 03/03/2022 CLINICAL DATA:  88 year old male presenting for evaluation of weakness post fall. EXAM: PORTABLE PELVIS 1-2 VIEWS COMPARISON:  None available. FINDINGS: No evidence of fracture or diastasis about the pelvis. Moderate to marked LEFT hip degenerative changes. Degenerative changes in the lumbar spine. Osteopenia. IMPRESSION: 1. No acute finding in the pelvis. 2. Moderate to marked LEFT hip degenerative changes. 3. Degenerative changes incidentally noted in the lumbar spine. Electronically Signed   By: Zetta Bills M.D.   On: 03/03/2022 19:54   DG Chest Port 1 View  Result Date: 03/03/2022 CLINICAL DATA:  A 88 year old male presents for evaluation of generalized weakness post fall. EXAM: PORTABLE CHEST 1 VIEW COMPARISON:  None available. FINDINGS: Cardiomediastinal contours and hilar structures are normal. Lung volumes are mildly low. No lobar consolidation. No sign of pneumothorax. No gross pleural effusion. On limited assessment no acute skeletal findings. IMPRESSION: 1. No acute cardiopulmonary disease. Electronically Signed   By: Zetta Bills M.D.   On: 03/03/2022 19:52    Procedures Procedures  {Document cardiac monitor, telemetry assessment procedure when appropriate:1}  Medications Ordered in  ED Medications  sodium chloride 0.9 % bolus 1,000 mL (0 mLs Intravenous Stopped 03/03/22 2031)  cefTRIAXone (ROCEPHIN) 2 g in sodium chloride 0.9 % 100 mL IVPB (0 g Intravenous Stopped 03/03/22 2103)  sodium chloride 0.9 % bolus 1,000 mL (1,000 mLs Intravenous New Bag/Given 03/03/22 2031)    ED Course/ Medical Decision Making/ A&P                           Medical Decision Making Amount and/or Complexity of Data Reviewed Labs: ordered. Radiology: ordered. ECG/medicine tests: ordered.  Risk Decision regarding hospitalization.   Patient with urinary tract infection and dehydration.  He will be admitted to medicine  {Document critical care time when appropriate:1} {Document review of labs and clinical decision tools ie heart score, Chads2Vasc2 etc:1}  {Document your independent review of radiology images, and any outside records:1} {Document your discussion  with family members, caretakers, and with consultants:1} {Document social determinants of health affecting pt's care:1} {Document your decision making why or why not admission, treatments were needed:1} Final Clinical Impression(s) / ED Diagnoses Final diagnoses:  Acute cystitis without hematuria  Dehydration    Rx / DC Orders ED Discharge Orders     None

## 2022-03-03 NOTE — ED Notes (Signed)
Patient transported to CT 

## 2022-03-03 NOTE — ED Notes (Signed)
ED Provider at bedside. 

## 2022-03-03 NOTE — ED Notes (Signed)
Hospitalist at bedside 

## 2022-03-04 DIAGNOSIS — N390 Urinary tract infection, site not specified: Secondary | ICD-10-CM | POA: Diagnosis present

## 2022-03-04 DIAGNOSIS — I878 Other specified disorders of veins: Secondary | ICD-10-CM | POA: Insufficient documentation

## 2022-03-04 DIAGNOSIS — E1165 Type 2 diabetes mellitus with hyperglycemia: Secondary | ICD-10-CM | POA: Insufficient documentation

## 2022-03-04 DIAGNOSIS — R531 Weakness: Secondary | ICD-10-CM | POA: Diagnosis not present

## 2022-03-04 DIAGNOSIS — E860 Dehydration: Secondary | ICD-10-CM | POA: Insufficient documentation

## 2022-03-04 DIAGNOSIS — N179 Acute kidney failure, unspecified: Secondary | ICD-10-CM | POA: Insufficient documentation

## 2022-03-04 DIAGNOSIS — E669 Obesity, unspecified: Secondary | ICD-10-CM | POA: Insufficient documentation

## 2022-03-04 DIAGNOSIS — E46 Unspecified protein-calorie malnutrition: Secondary | ICD-10-CM | POA: Insufficient documentation

## 2022-03-04 LAB — COMPREHENSIVE METABOLIC PANEL
ALT: 20 U/L (ref 0–44)
AST: 26 U/L (ref 15–41)
Albumin: 2.7 g/dL — ABNORMAL LOW (ref 3.5–5.0)
Alkaline Phosphatase: 47 U/L (ref 38–126)
Anion gap: 9 (ref 5–15)
BUN: 31 mg/dL — ABNORMAL HIGH (ref 8–23)
CO2: 23 mmol/L (ref 22–32)
Calcium: 8 mg/dL — ABNORMAL LOW (ref 8.9–10.3)
Chloride: 105 mmol/L (ref 98–111)
Creatinine, Ser: 1.72 mg/dL — ABNORMAL HIGH (ref 0.61–1.24)
GFR, Estimated: 37 mL/min — ABNORMAL LOW (ref >60)
Glucose, Bld: 166 mg/dL — ABNORMAL HIGH (ref 70–99)
Potassium: 3.8 mmol/L (ref 3.5–5.1)
Sodium: 137 mmol/L (ref 135–145)
Total Bilirubin: 0.8 mg/dL (ref 0.3–1.2)
Total Protein: 6.1 g/dL — ABNORMAL LOW (ref 6.5–8.1)

## 2022-03-04 LAB — PROCALCITONIN: Procalcitonin: 0.78 ng/mL

## 2022-03-04 LAB — CBC
HCT: 30.4 % — ABNORMAL LOW (ref 39.0–52.0)
Hemoglobin: 9.8 g/dL — ABNORMAL LOW (ref 13.0–17.0)
MCH: 32.5 pg (ref 26.0–34.0)
MCHC: 32.2 g/dL (ref 30.0–36.0)
MCV: 100.7 fL — ABNORMAL HIGH (ref 80.0–100.0)
Platelets: 157 10*3/uL (ref 150–400)
RBC: 3.02 MIL/uL — ABNORMAL LOW (ref 4.22–5.81)
RDW: 12.2 % (ref 11.5–15.5)
WBC: 9.5 10*3/uL (ref 4.0–10.5)
nRBC: 0 % (ref 0.0–0.2)

## 2022-03-04 LAB — HEMOGLOBIN A1C
Hgb A1c MFr Bld: 6.3 % — ABNORMAL HIGH (ref 4.8–5.6)
Mean Plasma Glucose: 134.11 mg/dL

## 2022-03-04 LAB — GLUCOSE, CAPILLARY
Glucose-Capillary: 163 mg/dL — ABNORMAL HIGH (ref 70–99)
Glucose-Capillary: 242 mg/dL — ABNORMAL HIGH (ref 70–99)
Glucose-Capillary: 180 mg/dL — ABNORMAL HIGH (ref 70–99)
Glucose-Capillary: 143 mg/dL — ABNORMAL HIGH (ref 70–99)

## 2022-03-04 LAB — APTT: aPTT: 30 seconds (ref 24–36)

## 2022-03-04 MED ORDER — GUAIFENESIN ER 600 MG PO TB12: 600.0000 mg | ORAL_TABLET | Freq: Two times a day (BID) | ORAL | Status: DC

## 2022-03-04 MED ORDER — IPRATROPIUM-ALBUTEROL 0.5-2.5 (3) MG/3ML IN SOLN
3.0000 mL | Freq: Four times a day (QID) | RESPIRATORY_TRACT | Status: DC
  Administered 2022-03-05 (×4): 3 mL via RESPIRATORY_TRACT
  Filled 2022-03-04 (×4): qty 3

## 2022-03-04 MED ORDER — SODIUM CHLORIDE 0.9 % IV SOLN
500.0000 mg | INTRAVENOUS | Status: DC
  Administered 2022-03-04 – 2022-03-05 (×2): 500 mg via INTRAVENOUS
  Filled 2022-03-04 (×2): qty 5

## 2022-03-04 MED ORDER — DM-GUAIFENESIN ER 30-600 MG PO TB12
1.0000 | ORAL_TABLET | Freq: Two times a day (BID) | ORAL | Status: DC
  Administered 2022-03-04 – 2022-03-06 (×4): 1 via ORAL
  Filled 2022-03-04 (×4): qty 1

## 2022-03-04 MED ORDER — ALBUTEROL SULFATE (2.5 MG/3ML) 0.083% IN NEBU
2.5000 mg | INHALATION_SOLUTION | RESPIRATORY_TRACT | Status: DC | PRN
  Administered 2022-03-04: 2.5 mg via RESPIRATORY_TRACT
  Filled 2022-03-04: qty 3

## 2022-03-04 NOTE — Progress Notes (Signed)
PROGRESS NOTE     Tim Henry, is a 88 y.o. male, DOB - 06/29/1927, POE:[id number]  Admit date - 03/03/2022   Admitting Physician Frankey Shown, DO  Outpatient Primary MD for the patient is Settle, Vic Ripper, MD  LOS - 1  Chief Complaint  Patient presents with   Weakness        Brief Narrative:   88 y.o. male with medical history significant of hypertension, hyperlipidemia T2DM, GERD, BPH admitted on 03/03/2022 with concerns for UTI and possible aspiration pneumonia with AKI and generalized weakness and deconditioning    -Assessment and Plan:  1)Presumed UTI--- procalcitonin 0.78, WBC trending down -Some dysuria and frequency persist -Continue IV Rocephin pending urine culture -Does Not meet sepsis criteria  2)CAP--procalcitonin and WBC as above -Some cough and congestion and dyspnea on exertion persist -c/n Rocephin and azithromycin -Bronchodilators and mucolytics as ordered -No hypoxia  3)AKI----acute kidney injury Vs Aki on CKD stage---Per admitting physician there was concern about dehydration at the time of admission which may be contributing to AKI -No recent creatinine available for comparison -Creatinine on admission was 2.19, -Creatinine currently trending down renally adjust medications, avoid nephrotoxic agents / dehydration  / hypotension  4)Anemia--- acute versus acute on chronic--- unsure if this is chronic anemia of CKD -No recent Hgb levels available for comparison -Hemoglobin currently above 9 -No bleeding concerns at this time -Monitor closely and transfuse as clinically indicated  5) generalized weakness and deconditioning/ambulatory dysfunction--- suspect this is multifactorial secondary to #1, #2, 3 and #4 above -Get PT eval prior to discharge home  6)DM2-A1c 6.3 reflecting excellent diabetic control PTA- -continue Lantus insulin Use Novolog/Humalog Sliding scale insulin with Accu-Cheks/Fingersticks as ordered   7)HTN--stable continue  verapamil Coreg and Avapro  8)Class 2 Obesity- -Low calorie diet, portion control and increase physical activity discussed with patient -Body mass index is 31.21 kg/m.  9) anxiety and depression--- well, continue sertraline and Xanax  10)BPH with LUTs--- patient Now has UTI with dysuria urinary frequency -Continue Proscar and Flomax  11)GERD--continue Protonix   Disposition/Need for in-Hospital Stay- patient unable to be discharged at this time due to -presumed UTI and pneumonia requiring IV antibiotics pending further culture data   Status is: Inpatient   Disposition: The patient is from: Home              Anticipated d/c is to: Home with [facility]               Anticipated d/c date is: 1 day              Patient currently is not medically stable to d/c. Barriers: Not Clinically Stable-   Code Status :  -  Code Status: Full Code   Family Communication:    NA (patient is alert, awake and coherent)   DVT Prophylaxis  :   - SCDs  enoxaparin (LOVENOX) injection 30 mg Start: 03/04/22 1000 SCDs Start: 03/03/22 2332   Lab Results  Component Value Date   PLT 157 03/04/2022    Inpatient Medications  Scheduled Meds:  ALPRAZolam  0.5 mg Oral BID   aspirin EC  81 mg Oral Daily   carvedilol  6.25 mg Oral BID WC   enoxaparin (LOVENOX) injection  30 mg Subcutaneous Q24H   feeding supplement (GLUCERNA SHAKE)  237 mL Oral TID BM   ferrous sulfate  325 mg Oral Q breakfast   finasteride  5 mg Oral Daily   insulin aspart  0-5 Units Subcutaneous QHS  insulin aspart  0-6 Units Subcutaneous TID WC   insulin glargine-yfgn  10 Units Subcutaneous QHS   irbesartan  150 mg Oral Daily   pantoprazole  80 mg Oral Daily   sertraline  100 mg Oral Daily   simvastatin  10 mg Oral Daily   tamsulosin  0.4 mg Oral QPC breakfast   verapamil  240 mg Oral Daily   Continuous Infusions:  cefTRIAXone (ROCEPHIN)  IV     PRN Meds:.acetaminophen **OR** acetaminophen, albuterol, ondansetron **OR**  ondansetron (ZOFRAN) IV   Anti-infectives (From admission, onward)    Start     Dose/Rate Route Frequency Ordered Stop   03/04/22 2000  cefTRIAXone (ROCEPHIN) 1 g in sodium chloride 0.9 % 100 mL IVPB        1 g 200 mL/hr over 30 Minutes Intravenous Every 24 hours 03/03/22 2258     03/03/22 2030  cefTRIAXone (ROCEPHIN) 2 g in sodium chloride 0.9 % 100 mL IVPB        2 g 200 mL/hr over 30 Minutes Intravenous  Once 03/03/22 2022 03/03/22 2103         Subjective: [employer] today has no fevers, no emesis,  No chest pain,    -Some cough and congestion and dyspnea on exertion persist -Some dysuria and frequency persist   Objective: Vitals:   03/04/22 0725 03/04/22 0826 03/04/22 1312 03/04/22 1319  BP: 128/64 125/70 (!) 103/56   Pulse: 84 90 80   Resp:      Temp: 98.8 F (37.1 C)  98.5 F (36.9 C)   TempSrc: Oral  Oral   SpO2: 97%  99% 92%  Weight:      Height:        Intake/Output Summary (Last 24 hours) at 03/04/2022 1659 Last data filed at 03/04/2022 1100 Gross per 24 hour  Intake 2580 ml  Output 1000 ml  Net 1580 ml   Filed Weights   03/03/22 1847 03/04/22 0305  Weight: 102.1 kg 101.5 kg    Physical Exam  Gen:- Awake Alert,  in no apparent distress  HEENT:- [place].AT, No sclera icterus Neck-Supple Neck,No JVD,.  Lungs-  CTAB , fair symmetrical air movement CV- S1, S2 normal, regular  Abd-  +ve B.Sounds, Abd Soft, No tenderness, no CVA area tenderness    Extremity/Skin:- +ve  edema, chronic venous stasis, pedal pulses present  Psych-affect is appropriate, oriented x3 Neuro-generalized weakness, no new focal deficits, no tremors  Data Reviewed: I have personally reviewed following labs and imaging studies  CBC: Recent Labs  Lab 03/03/22 1945 03/04/22 0438  WBC 11.3* 9.5  NEUTROABS 8.7*  --   HGB 10.0* 9.8*  HCT 31.5* 30.4*  MCV 100.6* 100.7*  PLT 160 157   Basic Metabolic Panel: Recent Labs  Lab 03/03/22 1945 03/04/22 0438  NA 135 137  K 4.7  3.8  CL 103 105  CO2 25 23  GLUCOSE 180* 166*  BUN 37* 31*  CREATININE 2.19* 1.72*  CALCIUM 8.3* 8.0*   GFR: Estimated Creatinine Clearance: 33.3 mL/min (A) (by C-G formula based on SCr of 1.72 mg/dL (H)). Liver Function Tests: Recent Labs  Lab 03/03/22 1945 03/04/22 0438  AST 30 26  ALT 22 20  ALKPHOS 54 47  BILITOT 1.1 0.8  PROT 6.9 6.1*  ALBUMIN 3.2* 2.7*   HbA1C: Recent Labs    03/03/22 1945  HGBA1C 6.3*   Radiology Studies: CT ABDOMEN PELVIS WO CONTRAST  Result Date: 03/03/2022 CLINICAL DATA:  Abdominal pain; generalized weakness  EXAM: CT ABDOMEN AND PELVIS WITHOUT CONTRAST TECHNIQUE: Multidetector CT imaging of the abdomen and pelvis was performed following the standard protocol without IV contrast. RADIATION DOSE REDUCTION: This exam was performed according to the departmental dose-optimization program which includes automated exposure control, adjustment of the mA and/or kV according to patient size and/or use of iterative reconstruction technique. COMPARISON:  None Available. FINDINGS: Lower chest: Normal heart size. No pericardial effusion. Coronary artery and mitral annular calcification. Bronchial wall thickening and mucous plugging in the lower lungs. Bibasilar atelectasis or infiltrates. Hepatobiliary: No focal hepatic abnormality. Large gallstone in the gallbladder. No biliary dilation. Pancreas: Unremarkable. Spleen: Unremarkable. Adrenals/Urinary Tract: Unremarkable adrenal glands. Bilateral low-density cystic lesions in the kidneys compatible with benign cysts. No follow-up required. No urinary calculi or hydronephrosis. Stomach/Bowel: Small hiatal hernia. Unremarkable stomach. Normal caliber large and small bowel. Advanced colonic diverticulosis without evidence of diverticulitis. Vascular/Lymphatic: Aortic atherosclerosis. No enlarged abdominal or pelvic lymph nodes. Reproductive: Unremarkable. Other: No free intraperitoneal fluid or air. Fat containing  right-greater-than-left inguinal hernias. Umbilical hernia containing fat and a knuckle of nonobstructed small bowel. Musculoskeletal: Demineralization. Thoracolumbar spondylosis and degenerative disc disease. Advanced left hip osteoarthritis. No acute osseous abnormality IMPRESSION: 1. No acute abnormality in the abdomen or pelvis. 2. Bronchial wall thickening and mucous plugging. Bibasilar infiltrates may be due to atelectasis, infection, or aspiration. 3. Advanced Colonic diverticulosis without diverticulitis. 4.  Aortic Atherosclerosis (ICD10-I70.0). Electronically Signed   By: Placido Sou M.D.   On: 03/03/2022 21:05   CT Head Wo Contrast  Result Date: 03/03/2022 CLINICAL DATA:  Generalized weakness EXAM: CT HEAD WITHOUT CONTRAST TECHNIQUE: Contiguous axial images were obtained from the base of the skull through the vertex without intravenous contrast. RADIATION DOSE REDUCTION: This exam was performed according to the departmental dose-optimization program which includes automated exposure control, adjustment of the mA and/or kV according to patient size and/or use of iterative reconstruction technique. COMPARISON:  07/07/2013 FINDINGS: Brain: No evidence of acute infarction, hemorrhage, hydrocephalus, extra-axial collection or mass lesion/mass effect. Cortical atrophy. Mild subcortical white matter and periventricular small vessel ischemic changes. Vascular: Mild intracranial atherosclerosis. Skull: Normal. Negative for fracture or focal lesion. Sinuses/Orbits: The visualized paranasal sinuses are essentially clear. The mastoid air cells are unopacified. Other: None. IMPRESSION: No evidence of acute intracranial abnormality. Atrophy with small vessel ischemic changes. Electronically Signed   By: Julian Hy M.D.   On: 03/03/2022 21:04   DG Pelvis Portable  Result Date: 03/03/2022 CLINICAL DATA:  88 year old male presenting for evaluation of weakness post fall. EXAM: PORTABLE PELVIS 1-2 VIEWS  COMPARISON:  None available. FINDINGS: No evidence of fracture or diastasis about the pelvis. Moderate to marked LEFT hip degenerative changes. Degenerative changes in the lumbar spine. Osteopenia. IMPRESSION: 1. No acute finding in the pelvis. 2. Moderate to marked LEFT hip degenerative changes. 3. Degenerative changes incidentally noted in the lumbar spine. Electronically Signed   By: Zetta Bills M.D.   On: 03/03/2022 19:54   DG Chest Port 1 View  Result Date: 03/03/2022 CLINICAL DATA:  A 88 year old male presents for evaluation of generalized weakness post fall. EXAM: PORTABLE CHEST 1 VIEW COMPARISON:  None available. FINDINGS: Cardiomediastinal contours and hilar structures are normal. Lung volumes are mildly low. No lobar consolidation. No sign of pneumothorax. No gross pleural effusion. On limited assessment no acute skeletal findings. IMPRESSION: 1. No acute cardiopulmonary disease. Electronically Signed   By: Zetta Bills M.D.   On: 03/03/2022 19:52     Scheduled Meds:  ALPRAZolam  0.5 mg Oral BID   aspirin EC  81 mg Oral Daily   carvedilol  6.25 mg Oral BID WC   enoxaparin (LOVENOX) injection  30 mg Subcutaneous Q24H   feeding supplement (GLUCERNA SHAKE)  237 mL Oral TID BM   ferrous sulfate  325 mg Oral Q breakfast   finasteride  5 mg Oral Daily   insulin aspart  0-5 Units Subcutaneous QHS   insulin aspart  0-6 Units Subcutaneous TID WC   insulin glargine-yfgn  10 Units Subcutaneous QHS   irbesartan  150 mg Oral Daily   pantoprazole  80 mg Oral Daily   sertraline  100 mg Oral Daily   simvastatin  10 mg Oral Daily   tamsulosin  0.4 mg Oral QPC breakfast   verapamil  240 mg Oral Daily   Continuous Infusions:  cefTRIAXone (ROCEPHIN)  IV       LOS: 1 day   Shon Hale M.D on 03/04/2022 at 4:59 PM  Go to [email or web address] - for contact info  [facility] - Office  [phone]  If 7PM-7AM, please contact night-coverage [email or web address] 03/04/2022, 4:59 PM

## 2022-03-04 NOTE — Progress Notes (Signed)
  Transition of Care [facility]) Screening Note   Patient Details  Name: MAURI TOLEN Date of Birth: [birth year]   Transition of Care [facility]) CM/SW Contact:    Iona Beard, [place] Phone Number: 03/04/2022, 11:04 AM    Transition of Care Department [facility]) has reviewed patient and no TOC needs have been identified at this time. We will continue to monitor patient advancement through interdisciplinary progression rounds. If new patient transition needs arise, please place a TOC consult.

## 2022-03-05 DIAGNOSIS — R531 Weakness: Secondary | ICD-10-CM | POA: Diagnosis not present

## 2022-03-05 LAB — GLUCOSE, CAPILLARY
Glucose-Capillary: 152 mg/dL — ABNORMAL HIGH (ref 70–99)
Glucose-Capillary: 197 mg/dL — ABNORMAL HIGH (ref 70–99)
Glucose-Capillary: 160 mg/dL — ABNORMAL HIGH (ref 70–99)
Glucose-Capillary: 168 mg/dL — ABNORMAL HIGH (ref 70–99)

## 2022-03-05 MED ORDER — ENOXAPARIN SODIUM 40 MG/0.4ML IJ SOSY
40.0000 mg | PREFILLED_SYRINGE | INTRAMUSCULAR | Status: DC
  Administered 2022-03-05 – 2022-03-06 (×2): 40 mg via SUBCUTANEOUS
  Filled 2022-03-05 (×2): qty 0.4

## 2022-03-05 MED ORDER — LATANOPROST 0.005 % OP SOLN
1.0000 [drp] | Freq: Every day | OPHTHALMIC | Status: DC
  Administered 2022-03-05: 1 [drp] via OPHTHALMIC
  Filled 2022-03-05: qty 2.5

## 2022-03-05 NOTE — Evaluation (Signed)
Occupational Therapy Evaluation Patient Details Name: Tim Henry MRN: [id number] DOB: 06-09-1927 Today's Date: 03/05/2022   History of Present Illness 88 y.o. M admitted on 03/03/22 due to generalized weakness and deconditioning. Found to have possible aspiration pneumonia and UTI. PMH significant for hypertension, hyperlipidemia T2DM, GERD, BPH.   Clinical Impression   Pt admitted for concerns listed above. PTA pt reported that he was independent with all ADL's and IADL's, except for driving.  At this time, pt presents with increased weakness, balance deficits, and decreased activity tolerance. He is requiring up to mod A for ADL's and functional mobility with a RW. Recommending pt have rehab in a SNF, unless family is able to provide up to mod A. OT will continue to follow acutely.      Recommendations for follow up therapy are one component of a multi-disciplinary discharge planning process, led by the attending physician.  Recommendations may be updated based on patient status, additional functional criteria and insurance authorization.   Follow Up Recommendations  Skilled nursing-short term rehab (<3 hours/day)    Assistance Recommended at Discharge Frequent or constant Supervision/Assistance  Patient can return home with the following A little help with walking and/or transfers;A lot of help with bathing/dressing/bathroom;Assistance with cooking/housework;Help with stairs or ramp for entrance    Functional Status Assessment  Patient has had a recent decline in their functional status and demonstrates the ability to make significant improvements in function in a reasonable and predictable amount of time.  Equipment Recommendations  None recommended by OT    Recommendations for Other Services       Precautions / Restrictions Precautions Precautions: Fall Restrictions Weight Bearing Restrictions: No      Mobility Bed Mobility Overal bed mobility: Modified Independent              General bed mobility comments: patient able to complete supine to sit transfer with no physical assist, but significantly increased time    Transfers Overall transfer level: Needs assistance Equipment used: Rolling walker (2 wheels) Transfers: Sit to/from Stand, Bed to chair/wheelchair/BSC Sit to Stand: Min guard     Step pivot transfers: Min assist     General transfer comment: patient able to stand and transfer to [facility] with min assist and increased time, slow movement      Balance Overall balance assessment: Needs assistance Sitting-balance support: No upper extremity supported, Feet supported Sitting balance-Leahy Scale: Good     Standing balance support: Bilateral upper extremity supported, During functional activity, Reliant on assistive device for balance Standing balance-Leahy Scale: Poor Standing balance comment: with RW                           ADL either performed or assessed with clinical judgement   ADL Overall ADL's : Needs assistance/impaired Eating/Feeding: Set up;Sitting   Grooming: Set up;Sitting   Upper Body Bathing: Min guard;Sitting   Lower Body Bathing: Minimal assistance;Sitting/lateral leans;Sit to/from stand   Upper Body Dressing : Min guard;Sitting   Lower Body Dressing: Minimal assistance;Moderate assistance;Sitting/lateral leans;Sit to/from stand   Toilet Transfer: Minimal assistance;Stand-pivot   Toileting- Clothing Manipulation and Hygiene: Moderate assistance;Sit to/from stand;Sitting/lateral lean       Functional mobility during ADLs: Minimal assistance;Rolling walker (2 wheels) General ADL Comments: Limited due to weakness and balance     Vision Baseline Vision/History: 1 Wears glasses Ability to See in Adequate Light: 0 Adequate Patient Visual Report: No change from baseline Vision Assessment?: No  apparent visual deficits     Perception     Praxis      Pertinent Vitals/Pain Pain Assessment Pain  Assessment: No/denies pain     Hand Dominance Right   Extremity/Trunk Assessment Upper Extremity Assessment Upper Extremity Assessment: Generalized weakness   Lower Extremity Assessment Lower Extremity Assessment: Generalized weakness   Cervical / Trunk Assessment Cervical / Trunk Assessment: Kyphotic   Communication Communication Communication: No difficulties   Cognition Arousal/Alertness: Awake/alert Behavior During Therapy: WFL for tasks assessed/performed Overall Cognitive Status: Within Functional Limits for tasks assessed                                       General Comments  VSS on RA    Exercises     Shoulder Instructions      Home Living Family/patient expects to be discharged to:: Private residence Living Arrangements: Children Available Help at Discharge: Family;Available 24 hours/day Type of Home: House Home Access: Stairs to enter [employer] of Steps: 3 steps Entrance Stairs-Rails: Right Home Layout: One level     Bathroom Shower/Tub: Teacher, early years/pre: Standard     Home Equipment: Conservation officer, nature (2 wheels);Rollator (4 wheels);Shower seat;Grab bars - toilet;Grab bars - tub/shower          Prior Functioning/Environment Prior Level of Function : Independent/Modified Independent             Mobility Comments: Community ambulator w/ RW, does not drive ADLs Comments: Independent        OT Problem List: Decreased strength;Decreased activity tolerance;Impaired balance (sitting and/or standing);Decreased safety awareness;Decreased knowledge of use of DME or AE;Impaired UE functional use      OT Treatment/Interventions: Self-care/ADL training;Therapeutic exercise;Energy conservation;DME and/or AE instruction;Therapeutic activities;Cognitive remediation/compensation;Patient/family education;Balance training    OT Goals(Current goals can be found in the care plan section) Acute Rehab OT  Goals Patient Stated Goal: To go home OT Goal Formulation: With patient Time For Goal Achievement: 03/19/22 Potential to Achieve Goals: Good ADL Goals Pt Will Perform Grooming: with supervision;standing Pt Will Perform Lower Body Bathing: with supervision;sitting/lateral leans;sit to/from stand Pt Will Perform Lower Body Dressing: with supervision;sit to/from stand;sitting/lateral leans Pt Will Transfer to Toilet: with supervision;ambulating Pt Will Perform Toileting - Clothing Manipulation and hygiene: with supervision;sitting/lateral leans;sit to/from stand  OT Frequency: Min 1X/week    Co-evaluation              AM-PAC OT "6 Clicks" Daily Activity     Outcome Measure Help from another person eating meals?: A Little Help from another person taking care of personal grooming?: A Little Help from another person toileting, which includes using toliet, bedpan, or urinal?: A Lot Help from another person bathing (including washing, rinsing, drying)?: A Little Help from another person to put on and taking off regular upper body clothing?: A Little Help from another person to put on and taking off regular lower body clothing?: A Lot 6 Click Score: 16   End of Session Equipment Utilized During Treatment: Rolling walker (2 wheels);Gait belt Nurse Communication: Mobility status  Activity Tolerance: Patient tolerated treatment well Patient left: Other (comment) (On BSC)  OT Visit Diagnosis: Unsteadiness on feet (R26.81);Other abnormalities of gait and mobility (R26.89);Muscle weakness (generalized) (M62.81)                Time: [id number] OT Time Calculation (min): 23 min Charges:  OT General Charges $OT  Visit: 1 Visit OT Evaluation $OT Eval Moderate Complexity: 1 Mod OT Treatments $Self Care/Home Management : 8-22 mins  Paulita Fujita, OTR/L [place] 03/05/2022, 7:16 PM

## 2022-03-05 NOTE — Care Management Important Message (Signed)
Important Message  Patient Details  Name: Tim Henry MRN: [id number] Date of Birth: 04-21-1928   Medicare Important Message Given:  N/A - LOS <3 / Initial given by admissions     Tommy Medal 03/05/2022, 1:54 PM

## 2022-03-05 NOTE — TOC Initial Note (Addendum)
Transition of Care [facility]) - Initial/Assessment Note    Patient Details  Name: Tim Henry MRN: [id number] Date of Birth: 01-Oct-1927  Transition [facility] Care [facility]) CM/SW Contact:    Annice Needy, LCSW Phone Number: 03/05/2022, 12:44 PM  Clinical Narrative:                 Patient from home with son and daughter. Admitted for UTI. At baseline, patient ambulates with a walker, goes to [employer] and works out, is independent with ADLs, makes his bed, washes dishes twice daily. Has shower bar, bathroom bar, walker, lift chair, reacher. Daughter takes him to appointments.  PT recommends SNF. Daughter chooses HHPT. Agencies discussed, referral made to [facility]. Expected Discharge Plan: Home w Home Health Services Barriers to Discharge: Continued Medical Work up   Patient Goals and CMS Choice        Expected Discharge Plan and Services Expected Discharge Plan: Home w Home Health Services       Living arrangements for the past 2 months: Single Family Home                                      Prior Living Arrangements/Services Living arrangements for the past 2 months: Single Family Home Lives with:: Adult Children Patient language and need for interpreter reviewed:: Yes        Need for Family Participation in Patient Care: Yes (Comment) Care giver support system in place?: Yes (comment) Current home services: DME Criminal Activity/Legal Involvement Pertinent to Current Situation/Hospitalization: No - Comment as needed  Activities of Daily Living Home Assistive Devices/Equipment: Wheelchair, Environmental consultant (specify type) ADL Screening (condition at time of admission) Patient's cognitive ability adequate to safely complete daily activities?: Yes Is the patient deaf or have difficulty hearing?: No Does the patient have difficulty seeing, even when wearing glasses/contacts?: No Does the patient have difficulty concentrating, remembering, or making decisions?: No Patient able  to express need for assistance with ADLs?: Yes Does the patient have difficulty dressing or bathing?: Yes Independently performs ADLs?: Yes (appropriate for developmental age) Does the patient have difficulty walking or climbing stairs?: Yes Weakness of Legs: Both Weakness of Arms/Hands: None  Permission Sought/Granted Permission sought to share information with : Family Supports    Share Information with NAME: daughter, Tim Henry           Emotional Assessment              Admission diagnosis:  Dehydration [E86.0] Acute cystitis without hematuria [N30.00] Generalized weakness [R53.1] Patient Active Problem List   Diagnosis Date Noted   UTI (urinary tract infection) 03/04/2022   AKI (acute kidney injury) (HCC) 03/04/2022   Type 2 diabetes mellitus with hyperglycemia (HCC) 03/04/2022   Dehydration 03/04/2022   Hypoalbuminemia due to protein-calorie malnutrition (HCC) 03/04/2022   Chronic venous stasis 03/04/2022   Obesity (BMI 30-39.9) 03/04/2022   Generalized weakness 03/03/2022   Left hydrocele 04/13/2020   Anxiety 12/18/2019   Arthritis of knee 12/18/2019   At maximum risk for fall 12/18/2019   Depression 12/18/2019   Dry cough 12/18/2019   Edema 12/18/2019   GERD (gastroesophageal reflux disease) 12/18/2019   Heart failure (HCC) 12/18/2019   Medicare annual wellness visit, subsequent 12/18/2019   Pleural effusion 12/18/2019   Hypertension 12/18/2019   Neck pain 12/18/2019   History of BPH 12/18/2019   Subdural hematoma (HCC) 12/18/2019   Diabetes (HCC)  12/18/2019   Varicose veins of bilateral lower extremities with other complications 76/54/6503   Edema of lower extremity 09/24/2019   Ankle ulcer ([place]) 12/22/2018   Varicose veins of lower extremities with ulcer ([place]) 05/30/2018   Numbness 05/13/2018   Diabetic peripheral neuropathy ([place]) 04/15/2018   Hallux valgus 04/15/2018   Onychomycosis 04/15/2018   Tibialis posterior tendinitis 04/15/2018    Ulcers of both lower extremities, limited to breakdown of skin ([place]) 12/15/2017   Lymphedema 04/10/2017   Bilateral lower extremity edema 03/01/2017   Lower extremity pain, bilateral 03/01/2017   Cellulitis of lower extremity 03/01/2017   Essential hypertension 03/01/2017   Hyperlipidemia 03/01/2017   Type 2 diabetes mellitus with complication ([place]) 54/65/6812   Headache disorder 02/13/2017   Loss of memory 02/13/2017   Unstable gait 02/13/2017   Borderline diabetes 02/07/2017   Microhematuria 11/19/2015   Hx of hydrocele 01/25/2014   Impaired glucose tolerance 01/25/2014   Indigestion 01/25/2014   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 [place] [address] Phone: [phone] Fax: [phone]     Social Determinants of Health (SDOH) Interventions    Readmission Risk Interventions     No data to display

## 2022-03-05 NOTE — Plan of Care (Signed)
  Problem: Acute Rehab PT Goals(only PT should resolve) Goal: Pt Will Go Supine/Side To Sit Outcome: Progressing Flowsheets (Taken 03/05/2022 1353) Pt will go Supine/Side to Sit: with modified independence Note: Decreased time Goal: Patient Will Transfer Sit To/From Stand Outcome: Progressing Flowsheets (Taken 03/05/2022 1353) Patient will transfer sit to/from stand: with modified independence Goal: Pt Will Transfer Bed To Chair/Chair To Bed Outcome: Progressing Flowsheets (Taken 03/05/2022 1353) Pt will Transfer Bed to Chair/Chair to Bed: with modified independence Goal: Pt Will Ambulate Outcome: Progressing Flowsheets (Taken 03/05/2022 1353) Pt will Ambulate:  with modified independence  50 feet  with rolling walker   Zigmund Gottron, SPT

## 2022-03-05 NOTE — Evaluation (Signed)
Physical Therapy Evaluation Patient Details Name: Tim Henry MRN: [id number] DOB: 01/28/1928 Today's Date: 03/05/2022  History of Present Illness  Tim Henry is a 88 y.o. male with medical history significant of hypertension, hyperlipidemia T2DM, GERD, BPH who presents to the emergency department via EMS from home due to generalized weakness.  Patient felt weak while ambulating at home, he leaned against the wall and slid down to the floor, he also presented with urinary incontinence (unusual for him).  He complained of 2-day onset of occasional burning sensation on urination, but denies chest pain, shortness of breath, headache, fever, chills.   Clinical Impression  Patient demonstrates labored movement and increased time for sitting up at bedside requiring verbal cueing but no physical assist. Patient able to stand and transfer to [facility] with min assist/RW, limited in distance by having to go to the bathroom. Patient left on [facility] after therapy - nurse aware. Patient will benefit from continued skilled physical therapy in hospital and recommended venue below to increase strength, balance, endurance for safe ADLs and gait.     Recommendations for follow up therapy are one component of a multi-disciplinary discharge planning process, led by the attending physician.  Recommendations may be updated based on patient status, additional functional criteria and insurance authorization.  Follow Up Recommendations Skilled nursing-short term rehab (<3 hours/day) Can patient physically be transported by private vehicle: Yes    Assistance Recommended at Discharge Set up Supervision/Assistance  Patient can return home with the following  A little help with walking and/or transfers;Help with stairs or ramp for entrance;Assistance with cooking/housework;A little help with bathing/dressing/bathroom    Equipment Recommendations None recommended by PT  Recommendations for Other Services        Functional Status Assessment Patient has had a recent decline in their functional status and demonstrates the ability to make significant improvements in function in a reasonable and predictable amount of time.     Precautions / Restrictions Precautions Precautions: Fall Restrictions Weight Bearing Restrictions: No      Mobility  Bed Mobility Overal bed mobility: Modified Independent             General bed mobility comments: patient able to complete supine to sit transfer with no physical assist, but significantly increased time    Transfers Overall transfer level: Needs assistance Equipment used: Rolling walker (2 wheels) Transfers: Sit to/from Stand, Bed to chair/wheelchair/BSC Sit to Stand: Supervision, Min guard   Step pivot transfers: Min assist       General transfer comment: patient able to stand and transfer to [facility] with min assist and increased time, slow movement    Ambulation/Gait Ambulation/Gait assistance: Min guard Gait Distance (Feet): 5 Feet Assistive device: Rolling walker (2 wheels) Gait Pattern/deviations: Decreased step length - right, Decreased step length - left, Decreased stride length, Trunk flexed Gait velocity: decreased     General Gait Details: patient with slowed cadence, able to complete a few steps to [facility] with min guard, limited by having to go to bathroom  Stairs            Wheelchair Mobility    Modified Rankin (Stroke Patients Only)       Balance Overall balance assessment: Needs assistance Sitting-balance support: No upper extremity supported, Feet supported Sitting balance-Leahy Scale: Good Sitting balance - Comments: fair/good seated EOB   Standing balance support: Bilateral upper extremity supported, During functional activity, Reliant on assistive device for balance Standing balance-Leahy Scale: Fair Standing balance comment: with RW  Pertinent Vitals/Pain Pain  Assessment Pain Assessment: No/denies pain    Home Living                          Prior Function Prior Level of Function : Independent/Modified Independent             Mobility Comments: Community ambulator w/ RW, does not drive ADLs Comments: Independent     Hand Dominance        Extremity/Trunk Assessment   Upper Extremity Assessment Upper Extremity Assessment: Defer to OT evaluation    Lower Extremity Assessment Lower Extremity Assessment: Generalized weakness    Cervical / Trunk Assessment Cervical / Trunk Assessment: Kyphotic  Communication   Communication: No difficulties  Cognition Arousal/Alertness: Awake/alert Behavior During Therapy: WFL for tasks assessed/performed Overall Cognitive Status: Within Functional Limits for tasks assessed                                          General Comments      Exercises     Assessment/Plan    PT Assessment Patient needs continued PT services  PT Problem List Decreased strength;Decreased activity tolerance;Decreased balance;Decreased mobility       PT Treatment Interventions DME instruction;Balance training;Gait training;Stair training;Functional mobility training;Patient/family education;Therapeutic activities;Therapeutic exercise    PT Goals (Current goals can be found in the Care Plan section)  Acute Rehab PT Goals Patient Stated Goal: return home PT Goal Formulation: With patient Time For Goal Achievement: 03/12/22 Potential to Achieve Goals: Good    Frequency Min 3X/week     Co-evaluation PT/OT/SLP Co-Evaluation/Treatment: Yes Reason for Co-Treatment: To address functional/ADL transfers PT goals addressed during session: Mobility/safety with mobility;Balance;Proper use of DME         AM-PAC PT "6 Clicks" Mobility  Outcome Measure Help needed turning from your back to your side while in a flat bed without using bedrails?: None Help needed moving from lying on  your back to sitting on the side of a flat bed without using bedrails?: A Little Help needed moving to and from a bed to a chair (including a wheelchair)?: A Little Help needed standing up from a chair using your arms (e.g., wheelchair or bedside chair)?: A Little Help needed to walk in hospital room?: A Little Help needed climbing 3-5 steps with a railing? : A Lot 6 Click Score: 18    End of Session   Activity Tolerance: Patient tolerated treatment well;Patient limited by fatigue Patient left: Other (comment);with call bell/phone within reach (left on bedside commode) Nurse Communication: Mobility status PT Visit Diagnosis: Unsteadiness on feet (R26.81);Other abnormalities of gait and mobility (R26.89);Muscle weakness (generalized) (M62.81)    Time: [id number] PT Time Calculation (min) (ACUTE ONLY): 21 min   Charges:   PT Evaluation $PT Eval Moderate Complexity: 1 Mod PT Treatments $Therapeutic Activity: 8-22 mins        Zigmund Gottron, SPT

## 2022-03-05 NOTE — Progress Notes (Signed)
PROGRESS NOTE     Tim Henry, is a 88 y.o. male, DOB - 01-01-1928, TDV:[id number]  Admit date - 03/03/2022   Admitting Physician Frankey Shown, DO  Outpatient Primary MD for the patient is Settle, Vic Ripper, MD  LOS - 2  Chief Complaint  Patient presents with   Weakness       Brief Narrative:   88 y.o. male with medical history significant of hypertension, hyperlipidemia T2DM, GERD, BPH admitted on 03/03/2022 with concerns for UTI and possible aspiration pneumonia with AKI and generalized weakness and deconditioning  03/05/22 -Physical therapy evaluation appreciated--- recommended SNF rehab -Discussed with patient's family, we will reevaluate patient on 03/16/2022 to see if he improves enough to go home with home health versus SNF rehab   -Assessment and Plan:  1) E. coli UTI--- procalcitonin 0.78, WBC trending down -dysuria and frequency improving -Continue IV Rocephin pending urine culture sensitivity -Does Not meet sepsis criteria  2)CAP--procalcitonin and WBC as above -Cough, Congestion and dyspnea improving -c/n Rocephin and azithromycin -Bronchodilators and mucolytics as ordered -No hypoxia  3)AKI----acute kidney injury Vs Aki on CKD stage---Per admitting physician there was concern about dehydration at the time of admission which may be contributing to AKI -No recent creatinine available for comparison -Creatinine on admission was 2.19, -Creatinine currently trending down renally adjust medications, avoid nephrotoxic agents / dehydration  / hypotension  4)Anemia--- acute versus acute on chronic--- unsure if this is chronic anemia of CKD -No recent Hgb levels available for comparison -Hemoglobin currently above 9 -No bleeding concerns at this time -Monitor closely and transfuse as clinically indicated  5)Generalized weakness and deconditioning/ambulatory dysfunction--- suspect this is multifactorial secondary to #1, #2, 3 and #4 above 03/05/22 -Physical therapy  evaluation appreciated--- recommended SNF rehab  6)DM2-A1c 6.3 reflecting excellent diabetic control PTA- -continue Lantus insulin Use Novolog/Humalog Sliding scale insulin with Accu-Cheks/Fingersticks as ordered   7)HTN--stable continue verapamil Coreg and Avapro  8)Class 2 Obesity- -Low calorie diet, portion control and increase physical activity discussed with patient -Body mass index is 31.21 kg/m.  9) anxiety and depression--- well, continue sertraline and Xanax  10)BPH with LUTs--- patient Now has UTI with dysuria urinary frequency -Continue Proscar and Flomax  11)GERD--continue Protonix   Disposition/Need for in-Hospital Stay- patient unable to be discharged at this time due to -presumed UTI and pneumonia requiring IV antibiotics pending further culture data 03/05/22 -Physical therapy evaluation appreciated--- recommended SNF rehab -Discussed with patient's family, we will reevaluate patient on 03/16/2022 to see if he improves enough to go home with home health versus SNF rehab  Status is: Inpatient   Disposition: The patient is from: Home              Anticipated d/c is to: Home with HH Vs SNF Rehab              Anticipated d/c date is: 1 day              Patient currently is not medically stable to d/c. Barriers: Not Clinically Stable-   Code Status :  -  Code Status: Full Code   Family Communication:   Discussed with daughter Melody at bedside  DVT Prophylaxis  :   - SCDs  enoxaparin (LOVENOX) injection 40 mg Start: 03/05/22 1000 SCDs Start: 03/03/22 2332   Lab Results  Component Value Date   PLT 157 03/04/2022    Inpatient Medications  Scheduled Meds:  ALPRAZolam  0.5 mg Oral BID   aspirin EC  81  mg Oral Daily   carvedilol  6.25 mg Oral BID WC   dextromethorphan-guaiFENesin  1 tablet Oral BID   enoxaparin (LOVENOX) injection  40 mg Subcutaneous Q24H   feeding supplement (GLUCERNA SHAKE)  237 mL Oral TID BM   ferrous sulfate  325 mg Oral Q breakfast    finasteride  5 mg Oral Daily   insulin aspart  0-5 Units Subcutaneous QHS   insulin aspart  0-6 Units Subcutaneous TID WC   insulin glargine-yfgn  10 Units Subcutaneous QHS   ipratropium-albuterol  3 mL Nebulization QID   irbesartan  150 mg Oral Daily   pantoprazole  80 mg Oral Daily   sertraline  100 mg Oral Daily   simvastatin  10 mg Oral Daily   tamsulosin  0.4 mg Oral QPC breakfast   verapamil  240 mg Oral Daily   Continuous Infusions:  azithromycin 500 mg (03/04/22 1815)   cefTRIAXone (ROCEPHIN)  IV 1 g (03/04/22 1954)   PRN Meds:.acetaminophen **OR** acetaminophen, albuterol, ondansetron **OR** ondansetron (ZOFRAN) IV   Anti-infectives (From admission, onward)    Start     Dose/Rate Route Frequency Ordered Stop   03/04/22 2000  cefTRIAXone (ROCEPHIN) 1 g in sodium chloride 0.9 % 100 mL IVPB        1 g 200 mL/hr over 30 Minutes Intravenous Every 24 hours 03/03/22 2258     03/04/22 1800  azithromycin (ZITHROMAX) 500 mg in sodium chloride 0.9 % 250 mL IVPB        500 mg 250 mL/hr over 60 Minutes Intravenous Every 24 hours 03/04/22 1702     03/03/22 2030  cefTRIAXone (ROCEPHIN) 2 g in sodium chloride 0.9 % 100 mL IVPB        2 g 200 mL/hr over 30 Minutes Intravenous  Once 03/03/22 2022 03/03/22 2103         Subjective: [facility] today has no fevers, no emesis,  No chest pain,     03/05/22 -Urinary frequency, cough and dyspnea improving -Physical therapy evaluation appreciated--- recommended SNF rehab -Discussed with patient's family, we will reevaluate patient on 03/16/2022 to see if he improves enough to go home with home health versus SNF rehab -daughter Melody at bedside   Objective: Vitals:   03/05/22 0723 03/05/22 0843 03/05/22 1103 03/05/22 1347  BP:  (!) 144/79  (!) 105/54  Pulse:  86  67  Resp:    18  Temp:    97.6 F (36.4 C)  TempSrc:    Oral  SpO2: 98% 98% 98% 99%  Weight:      Height:        Intake/Output Summary (Last 24 hours) at  03/05/2022 1518 Last data filed at 03/05/2022 1200 Gross per 24 hour  Intake 1060.69 ml  Output 1300 ml  Net -239.31 ml   Filed Weights   03/03/22 1847 03/04/22 0305  Weight: 102.1 kg 101.5 kg    Physical Exam  Gen:- Awake Alert,  in no apparent distress  HEENT:- [place].AT, No sclera icterus Neck-Supple Neck,No JVD,.  Lungs-  CTAB , fair symmetrical air movement CV- S1, S2 normal, regular  Abd-  +ve B.Sounds, Abd Soft, No tenderness, no CVA area tenderness    Extremity/Skin:- +ve  edema, chronic venous stasis, pedal pulses present  Psych-affect is appropriate, oriented x3 Neuro-generalized weakness, no new focal deficits, no tremors  Data Reviewed: I have personally reviewed following labs and imaging studies  CBC: Recent Labs  Lab 03/03/22 1945 03/04/22 0438  WBC 11.3*  9.5  NEUTROABS 8.7*  --   HGB 10.0* 9.8*  HCT 31.5* 30.4*  MCV 100.6* 100.7*  PLT 160 157   Basic Metabolic Panel: Recent Labs  Lab 03/03/22 1945 03/04/22 0438  NA 135 137  K 4.7 3.8  CL 103 105  CO2 25 23  GLUCOSE 180* 166*  BUN 37* 31*  CREATININE 2.19* 1.72*  CALCIUM 8.3* 8.0*   GFR: Estimated Creatinine Clearance: 33.3 mL/min (A) (by C-G formula based on SCr of 1.72 mg/dL (H)). Liver Function Tests: Recent Labs  Lab 03/03/22 1945 03/04/22 0438  AST 30 26  ALT 22 20  ALKPHOS 54 47  BILITOT 1.1 0.8  PROT 6.9 6.1*  ALBUMIN 3.2* 2.7*   HbA1C: Recent Labs    03/03/22 1945  HGBA1C 6.3*   Radiology Studies: CT ABDOMEN PELVIS WO CONTRAST  Result Date: 03/03/2022 CLINICAL DATA:  Abdominal pain; generalized weakness EXAM: CT ABDOMEN AND PELVIS WITHOUT CONTRAST TECHNIQUE: Multidetector CT imaging of the abdomen and pelvis was performed following the standard protocol without IV contrast. RADIATION DOSE REDUCTION: This exam was performed according to the departmental dose-optimization program which includes automated exposure control, adjustment of the mA and/or kV according to patient size  and/or use of iterative reconstruction technique. COMPARISON:  None Available. FINDINGS: Lower chest: Normal heart size. No pericardial effusion. Coronary artery and mitral annular calcification. Bronchial wall thickening and mucous plugging in the lower lungs. Bibasilar atelectasis or infiltrates. Hepatobiliary: No focal hepatic abnormality. Large gallstone in the gallbladder. No biliary dilation. Pancreas: Unremarkable. Spleen: Unremarkable. Adrenals/Urinary Tract: Unremarkable adrenal glands. Bilateral low-density cystic lesions in the kidneys compatible with benign cysts. No follow-up required. No urinary calculi or hydronephrosis. Stomach/Bowel: Small hiatal hernia. Unremarkable stomach. Normal caliber large and small bowel. Advanced colonic diverticulosis without evidence of diverticulitis. Vascular/Lymphatic: Aortic atherosclerosis. No enlarged abdominal or pelvic lymph nodes. Reproductive: Unremarkable. Other: No free intraperitoneal fluid or air. Fat containing right-greater-than-left inguinal hernias. Umbilical hernia containing fat and a knuckle of nonobstructed small bowel. Musculoskeletal: Demineralization. Thoracolumbar spondylosis and degenerative disc disease. Advanced left hip osteoarthritis. No acute osseous abnormality IMPRESSION: 1. No acute abnormality in the abdomen or pelvis. 2. Bronchial wall thickening and mucous plugging. Bibasilar infiltrates may be due to atelectasis, infection, or aspiration. 3. Advanced Colonic diverticulosis without diverticulitis. 4.  Aortic Atherosclerosis (ICD10-I70.0). Electronically Signed   By: Minerva Fester M.D.   On: 03/03/2022 21:05   CT Head Wo Contrast  Result Date: 03/03/2022 CLINICAL DATA:  Generalized weakness EXAM: CT HEAD WITHOUT CONTRAST TECHNIQUE: Contiguous axial images were obtained from the base of the skull through the vertex without intravenous contrast. RADIATION DOSE REDUCTION: This exam was performed according to the departmental  dose-optimization program which includes automated exposure control, adjustment of the mA and/or kV according to patient size and/or use of iterative reconstruction technique. COMPARISON:  07/07/2013 FINDINGS: Brain: No evidence of acute infarction, hemorrhage, hydrocephalus, extra-axial collection or mass lesion/mass effect. Cortical atrophy. Mild subcortical white matter and periventricular small vessel ischemic changes. Vascular: Mild intracranial atherosclerosis. Skull: Normal. Negative for fracture or focal lesion. Sinuses/Orbits: The visualized paranasal sinuses are essentially clear. The mastoid air cells are unopacified. Other: None. IMPRESSION: No evidence of acute intracranial abnormality. Atrophy with small vessel ischemic changes. Electronically Signed   By: Charline Bills M.D.   On: 03/03/2022 21:04   DG Pelvis Portable  Result Date: 03/03/2022 CLINICAL DATA:  88 year old male presenting for evaluation of weakness post fall. EXAM: PORTABLE PELVIS 1-2 VIEWS COMPARISON:  None available. FINDINGS:  No evidence of fracture or diastasis about the pelvis. Moderate to marked LEFT hip degenerative changes. Degenerative changes in the lumbar spine. Osteopenia. IMPRESSION: 1. No acute finding in the pelvis. 2. Moderate to marked LEFT hip degenerative changes. 3. Degenerative changes incidentally noted in the lumbar spine. Electronically Signed   By: Zetta Bills M.D.   On: 03/03/2022 19:54   DG Chest Port 1 View  Result Date: 03/03/2022 CLINICAL DATA:  A 88 year old male presents for evaluation of generalized weakness post fall. EXAM: PORTABLE CHEST 1 VIEW COMPARISON:  None available. FINDINGS: Cardiomediastinal contours and hilar structures are normal. Lung volumes are mildly low. No lobar consolidation. No sign of pneumothorax. No gross pleural effusion. On limited assessment no acute skeletal findings. IMPRESSION: 1. No acute cardiopulmonary disease. Electronically Signed   By: Zetta Bills M.D.    On: 03/03/2022 19:52     Scheduled Meds:  ALPRAZolam  0.5 mg Oral BID   aspirin EC  81 mg Oral Daily   carvedilol  6.25 mg Oral BID WC   dextromethorphan-guaiFENesin  1 tablet Oral BID   enoxaparin (LOVENOX) injection  40 mg Subcutaneous Q24H   feeding supplement (GLUCERNA SHAKE)  237 mL Oral TID BM   ferrous sulfate  325 mg Oral Q breakfast   finasteride  5 mg Oral Daily   insulin aspart  0-5 Units Subcutaneous QHS   insulin aspart  0-6 Units Subcutaneous TID WC   insulin glargine-yfgn  10 Units Subcutaneous QHS   ipratropium-albuterol  3 mL Nebulization QID   irbesartan  150 mg Oral Daily   pantoprazole  80 mg Oral Daily   sertraline  100 mg Oral Daily   simvastatin  10 mg Oral Daily   tamsulosin  0.4 mg Oral QPC breakfast   verapamil  240 mg Oral Daily   Continuous Infusions:  azithromycin 500 mg (03/04/22 1815)   cefTRIAXone (ROCEPHIN)  IV 1 g (03/04/22 1954)     LOS: 2 days   Roxan Hockey M.D on 03/05/2022 at 3:18 PM  Go to [email or web address] - for contact info  [facility] - Office  [phone]  If 7PM-7AM, please contact night-coverage [email or web address] 03/05/2022, 3:18 PM

## 2022-03-05 NOTE — Progress Notes (Signed)
Patient awake most of the night. Routine antianxiety administered as ordered.  Patient c/o pain once and PRN pain medication administered and patient reported it was effective. Safety maintained, call bell within reach, bed in lowest position, bed alarm on and functioning without any issues. Will continue to monitor and endorse.

## 2022-03-06 DIAGNOSIS — R531 Weakness: Secondary | ICD-10-CM | POA: Diagnosis not present

## 2022-03-06 LAB — URINE CULTURE: Culture: >=100000 — AB

## 2022-03-06 LAB — RENAL FUNCTION PANEL
Albumin: 2.5 g/dL — ABNORMAL LOW (ref 3.5–5.0)
Anion gap: 8 (ref 5–15)
BUN: 23 mg/dL (ref 8–23)
CO2: 26 mmol/L (ref 22–32)
Calcium: 8.4 mg/dL — ABNORMAL LOW (ref 8.9–10.3)
Chloride: 106 mmol/L (ref 98–111)
Creatinine, Ser: 1.29 mg/dL — ABNORMAL HIGH (ref 0.61–1.24)
GFR, Estimated: 52 mL/min — ABNORMAL LOW (ref >60)
Glucose, Bld: 126 mg/dL — ABNORMAL HIGH (ref 70–99)
Phosphorus: 3.5 mg/dL (ref 2.5–4.6)
Potassium: 3.6 mmol/L (ref 3.5–5.1)
Sodium: 140 mmol/L (ref 135–145)

## 2022-03-06 LAB — CBC
HCT: 29.9 % — ABNORMAL LOW (ref 39.0–52.0)
Hemoglobin: 9.4 g/dL — ABNORMAL LOW (ref 13.0–17.0)
MCH: 32.2 pg (ref 26.0–34.0)
MCHC: 31.4 g/dL (ref 30.0–36.0)
MCV: 102.4 fL — ABNORMAL HIGH (ref 80.0–100.0)
Platelets: 184 10*3/uL (ref 150–400)
RBC: 2.92 MIL/uL — ABNORMAL LOW (ref 4.22–5.81)
RDW: 12.3 % (ref 11.5–15.5)
WBC: 7.5 10*3/uL (ref 4.0–10.5)
nRBC: 0 % (ref 0.0–0.2)

## 2022-03-06 LAB — GLUCOSE, CAPILLARY
Glucose-Capillary: 139 mg/dL — ABNORMAL HIGH (ref 70–99)
Glucose-Capillary: 220 mg/dL — ABNORMAL HIGH (ref 70–99)

## 2022-03-06 MED ORDER — DM-GUAIFENESIN ER 30-600 MG PO TB12: 1.0000 | ORAL_TABLET | Freq: Two times a day (BID) | ORAL | 0 refills | Status: DC

## 2022-03-06 MED ORDER — METFORMIN HCL 500 MG PO TABS: 500.0000 mg | ORAL_TABLET | Freq: Two times a day (BID) | ORAL | 3 refills | Status: AC

## 2022-03-06 MED ORDER — ALBUTEROL SULFATE HFA 108 (90 BASE) MCG/ACT IN AERS: 2.0000 | INHALATION_SPRAY | Freq: Four times a day (QID) | RESPIRATORY_TRACT | 2 refills | Status: DC | PRN

## 2022-03-06 MED ORDER — CEFDINIR 300 MG PO CAPS: 300.0000 mg | ORAL_CAPSULE | Freq: Two times a day (BID) | ORAL | 0 refills | Status: AC

## 2022-03-06 MED ORDER — ASPIRIN 81 MG PO TBEC: 81.0000 mg | DELAYED_RELEASE_TABLET | Freq: Every day | ORAL | 11 refills | Status: AC

## 2022-03-06 MED ORDER — FUROSEMIDE 20 MG PO TABS: 20.0000 mg | ORAL_TABLET | Freq: Every day | ORAL | 1 refills | Status: DC

## 2022-03-06 MED ORDER — IPRATROPIUM-ALBUTEROL 0.5-2.5 (3) MG/3ML IN SOLN
3.0000 mL | Freq: Three times a day (TID) | RESPIRATORY_TRACT | Status: DC
  Administered 2022-03-06 (×2): 3 mL via RESPIRATORY_TRACT
  Filled 2022-03-06 (×2): qty 3

## 2022-03-06 MED ORDER — AZITHROMYCIN 500 MG PO TABS: 500.0000 mg | ORAL_TABLET | Freq: Every day | ORAL | 0 refills | Status: AC

## 2022-03-06 NOTE — TOC Transition Note (Signed)
Transition of Care [facility]) - CM/SW Discharge Note   Patient Details  Name: HARUN BRUMLEY MRN: [id number] Date of Birth: 1927/10/17  Transition of Care (TOC) CM/SW Contact:  Joaquin Courts, RN Phone Number: 03/06/2022, 11:29 AM   Clinical Narrative:     Patient to discharge home today with HHPT/OT, previosly set up with [facility]. No further TOC needs identified at this time.  Final next level of care: [place] Barriers to Discharge: No Barriers Identified   Patient Goals and CMS Choice Patient states their goals for this hospitalization and ongoing recovery are:: to go home CMS Medicare.gov Compare Post Acute Care list provided to:: Patient Represenative (must comment) Choice offered to / list presented to : Adult Children  Discharge Placement                       Discharge Plan and Services                DME Arranged: N/A DME Agency: NA       HH Arranged: PT, OT HH Agency: [facility]     Representative spoke with at [place]: [place]) Interventions     Readmission Risk Interventions    03/05/2022    3:24 PM  Readmission Risk Prevention Plan  Transportation Screening Complete  PCP or Specialist Appt within 5-7 Days Complete  Home Care Screening Complete  Medication Review (RN CM) Complete

## 2022-03-06 NOTE — Progress Notes (Signed)
Pt A&O x 2, confused throughout night. Pt tried to climb out of the bed twice, redirected. Pt complained of 8/10 pain, PRN tylenol given upon request. Bilateral wounds cleansed and wrapped.

## 2022-03-06 NOTE — Discharge Summary (Signed)
Tim Henry, is a 88 y.o. male  DOB [birth year]  MRN [id number].  Admission date:  03/03/2022  Admitting Physician  Frankey Shown, DO  Discharge Date:  03/06/2022   Primary MD  Aniceto Boss, MD  Recommendations for primary care physician for things to follow:   1)Avoid ibuprofen/Advil/Aleve/Motrin/Goody Powders/Naproxen/BC powders/Meloxicam/Diclofenac/Indomethacin and other Nonsteroidal anti-inflammatory medications as these will make you more likely to bleed and can cause stomach ulcers, can also cause Kidney problems.   2)Repeat CBC and BMP blood test with primary care physician in about a week or so  Admission Diagnosis  Dehydration [E86.0] Acute cystitis without hematuria [N30.00] Generalized weakness [R53.1]   Discharge Diagnosis  Dehydration [E86.0] Acute cystitis without hematuria [N30.00] Generalized weakness [R53.1]    Principal Problem:   Generalized weakness Active Problems:   Essential hypertension   BPH (benign prostatic hyperplasia)   Anxiety   Depression   GERD (gastroesophageal reflux disease)   UTI (urinary tract infection)   AKI (acute kidney injury) (HCC)   Type 2 diabetes mellitus with hyperglycemia (HCC)   Dehydration   Hypoalbuminemia due to protein-calorie malnutrition (HCC)   Chronic venous stasis   Obesity (BMI 30-39.9)      Past Medical History:  Diagnosis Date   Anemia    in past   Anxiety    Dementia (HCC)    able to sign own papers   Depression    Diabetes mellitus without complication (HCC)    Full dentures    upper and lower   GERD (gastroesophageal reflux disease)    Head trauma    admitted to [facility], "pint of blood removed from head", no deficits after   Hypercholesteremia    Hypertension    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)    none since release from hosp    Past Surgical History:  Procedure Laterality Date   APPENDECTOMY     childhood    BACK SURGERY  1963   CATARACT EXTRACTION W/PHACO Left 09/15/2014   Procedure: CATARACT EXTRACTION PHACO AND INTRAOCULAR LENS PLACEMENT (IOC);  Surgeon: Lockie Mola, MD;  Location: [facility];  Service: Ophthalmology;  Laterality: Left;  DIABETIC PT WOULD LIKE TO HAVE ARRIVAL TIME LATE AM   JOINT REPLACEMENT Left    knee   PHOTOCOAGULATION WITH LASER Right 01/23/2016   Procedure: PHOTOCOAGULATION WITH LASER;  Surgeon: Sherald Hess, MD;  Location: [facility];  Service: Ophthalmology;  Laterality: Right;  DIABETIC - oral meds RIGHT Requests arrival 10AM or after     HPI  from the history and physical done on the day of admission:    HPI: Tim Henry is a 88 y.o. male with medical history significant of hypertension, hyperlipidemia T2DM, GERD, BPH who presents to the emergency department via EMS from home due to generalized weakness.  Patient felt weak while ambulating at home, he leaned against the wall and slid down to the floor, he also presented with urinary incontinence (unusual for him).  He complained of 2-day onset of occasional  burning sensation on urination, but denies chest pain, shortness of breath, headache, fever, chills.     ED Course:  In the emergency department, he was hemodynamically stable, though BP was soft at 107/51.  Work-up in the ED showed macrocytic anemia and normal BMP except for BUN/creatinine 37/2.19 (creatinine on 02/06/2022 was 1.6), hyperglycemia, lactic acid x 2 was flat at 1.3, urinalysis was positive for large leukocytes and greater than 50 WBCs with many bacteria. CT abdomen and pelvis without contrast showed no acute abnormality in the abdomen or pelvis, but showed bronchial wall thickening and mucous plugging.  Bibasilar infiltrates may be due to atelectasis, infection or aspiration. CT of head without contrast showed no evidence of acute intracranial abnormality Pelvic x-ray showed no acute finding in the  pelvis Chest x-ray showed no acute cardiopulmonary disease Patient was empirically started on IV ceftriaxone due to presumed UTI, IV hydration was provided. Hospitalist was asked to admit patient for further evaluation and management.   Review of Systems: Review of systems as noted in the HPI. All other systems reviewed and are negative.     Hospital Course:     Brief Narrative:   88 y.o. male with medical history significant of hypertension, hyperlipidemia T2DM, GERD, BPH admitted on 03/03/2022 with concerns for UTI and possible aspiration pneumonia with AKI and generalized weakness and deconditioning -Physical therapy evaluation appreciated--- recommended SNF rehab -Discussed with patient and pt's daughter Melody-- They request  dc home with  home health , rather than SNF rehab   -Assessment and Plan:   1) E. coli UTI--- procalcitonin 0.78, WBC down to 7.5 from 11.3 on admission -dysuria has resolved --Treated with IV Rocephin  -Urine culture with pansensitive E. Coli, ok to discharge on Omnicef -Does Not meet sepsis criteria   2)CAP--procalcitonin and WBC as above -Respiratory status has improved significantly -No hypoxia -Telemetry with Rocephin and azithromycin -Bronchodilators and mucolytics as ordered -Okay to discharge on Omnicef and doxycycline   3)AKI----acute kidney injury Vs Aki on CKD stage---Per admitting physician there was concern about dehydration at the time of admission which may be contributing to AKI -No recent creatinine available for comparison -Creatinine on admission was 2.19, -Creatinine improved significantly currently at the time of discharge creatinine is down to 1.29 renally adjust medications, avoid nephrotoxic agents / dehydration  / hypotension -Repeat BMP with PCP in about a week or so   4)Anemia--- acute versus acute on chronic--- unsure if this is chronic anemia of CKD -No recent Hgb levels available for comparison -Hemoglobin currently  above 9 -No bleeding concerns at this time -Repeat CBC with PCP in about a week or so   5)Generalized weakness and deconditioning/ambulatory dysfunction--- suspect this is multifactorial secondary to #1, #2, 3 and #4 above -Physical therapy evaluation appreciated--- recommended SNF rehab -Discussed with patient and pt's daughter Melody-- They request  dc home with  home health , rather than SNF rehab  6)DM2-A1c 6.3 reflecting excellent diabetic control PTA- -continue home diabetic/insulin regimen   7)HTN--stable continue verapamil Coreg and Avapro   8)Class 2 Obesity- -Low calorie diet, portion control and increase physical activity discussed with patient -Body mass index is 31.21 kg/m.   9) anxiety and depression--- stable, continue sertraline and Xanax   10)BPH with LUTs--- patient Now has UTI with dysuria urinary frequency -Continue Proscar and Flomax   11)GERD--continue Protonix    Disposition: The patient is from: Home              Anticipated d/c  is to: Home with [facility]   Discharge Condition: Stable  Follow UP   Follow-up Information     [facility], [facility].   Specialty: Home Health Services Why: Will provide home health physical therapy. Contact information: [place] [address] [phone]         Josem Kaufmann, MD. Schedule an appointment as soon as possible for a visit.   Specialty: Family Medicine Why: Repeat CBC and BMP Blood Tests in 1 week Contact information: [place] [address] [phone]                 Diet and Activity recommendation:  As advised  Discharge Instructions   Discharge Instructions     Call MD for:  difficulty breathing, headache or visual disturbances   Complete by: As directed    Call MD for:  persistant dizziness or light-headedness   Complete by: As directed    Call MD for:  persistant nausea and vomiting   Complete by: As directed    Call MD for:   temperature >100.4   Complete by: As directed    Diet - low sodium heart healthy   Complete by: As directed    Diet Carb Modified   Complete by: As directed    Discharge instructions   Complete by: As directed    1)Avoid ibuprofen/Advil/Aleve/Motrin/Goody Powders/Naproxen/BC powders/Meloxicam/Diclofenac/Indomethacin and other Nonsteroidal anti-inflammatory medications as these will make you more likely to bleed and can cause stomach ulcers, can also cause Kidney problems.   2)Repeat CBC and BMP blood test with primary care physician in about a week or so   Discharge wound care:   Complete by: As directed    Skin protectant   Increase activity slowly   Complete by: As directed        Discharge Medications     Allergies as of 03/06/2022   No Known Allergies      Medication List     STOP taking these medications    Influenza Virus Vacc Split PF 0.5 ML Susy   sucralfate 1 g tablet Commonly known as: CARAFATE       TAKE these medications    albuterol 108 (90 Base) MCG/ACT inhaler Commonly known as: VENTOLIN HFA Inhale 2 puffs into the lungs every 6 (six) hours as needed for wheezing or shortness of breath.   ALPRAZolam 0.5 MG tablet Commonly known as: XANAX Take 0.5 mg by mouth 2 (two) times daily.   aspirin EC 81 MG tablet Take 1 tablet (81 mg total) by mouth daily with breakfast. Swallow whole. What changed: when to take this   azithromycin 500 MG tablet Commonly known as: ZITHROMAX Take 1 tablet (500 mg total) by mouth daily for 3 days.   carvedilol 6.25 MG tablet Commonly known as: COREG Take 6.25 mg by mouth 2 (two) times daily with a meal.   cefdinir 300 MG capsule Commonly known as: OMNICEF Take 1 capsule (300 mg total) by mouth 2 (two) times daily for 5 days.   dextromethorphan-guaiFENesin 30-600 MG 12hr tablet Commonly known as: MUCINEX DM Take 1 tablet by mouth 2 (two) times daily.   diphenhydrAMINE 25 MG tablet Commonly known as:  BENADRYL Take 25 mg by mouth every 6 (six) hours as needed for allergies.   dorzolamide-timolol 2-0.5 % ophthalmic solution Commonly known as: COSOPT Place 1 drop into both eyes 2 (two) times daily.   ferrous sulfate 325 (65 FE) MG tablet Take  325 mg by mouth daily with breakfast.   finasteride 5 MG tablet Commonly known as: PROSCAR Take 1 tablet (5 mg total) by mouth daily.   furosemide 20 MG tablet Commonly known as: LASIX Take 1 tablet (20 mg total) by mouth daily. What changed: when to take this   gabapentin 300 MG capsule Commonly known as: NEURONTIN Take 300 mg by mouth 2 (two) times daily. 1 PM, 11 PM   Glucosamine 500 MG Caps Take 3 capsules by mouth every evening.   GLUCOSAMINE HCL PO Take by mouth.   latanoprost 0.005 % ophthalmic solution Commonly known as: XALATAN Place 1 drop into both eyes at bedtime.   metFORMIN 500 MG tablet Commonly known as: GLUCOPHAGE Take 1 tablet (500 mg total) by mouth 2 (two) times daily with a meal. 1 PM, 11 PM What changed: when to take this   multivitamin capsule Take 1 capsule by mouth daily.   OIL OF OREGANO PO Take by mouth.   omeprazole 40 MG capsule Commonly known as: PRILOSEC Take 40 mg by mouth daily.   PHOSPHATIDYL CHOLINE PO Take 2 tablets by mouth daily.   potassium chloride 10 MEQ tablet Commonly known as: KLOR-CON Take 20 mEq by mouth daily.   PROBIOTIC DAILY PO Take by mouth.   sertraline 100 MG tablet Commonly known as: ZOLOFT Take 100 mg by mouth daily.   simvastatin 10 MG tablet Commonly known as: ZOCOR Take 10 mg by mouth daily. 10 AM   sitaGLIPtin 100 MG tablet Commonly known as: JANUVIA Take 100 mg by mouth daily.   tamsulosin 0.4 MG Caps capsule Commonly known as: FLOMAX Take 1 capsule (0.4 mg total) by mouth daily after breakfast. 1 PM   telmisartan 40 MG tablet Commonly known as: MICARDIS Take 20 mg by mouth daily.   verapamil 240 MG (CO) 24 hr tablet Commonly known as:  COVERA HS Take 240 mg by mouth daily.   Vitamin D (Ergocalciferol) 1.25 MG (50000 UNIT) Caps capsule Commonly known as: DRISDOL Take 50,000 Units by mouth every 7 (seven) days. Wednesday               Discharge Care Instructions  (From admission, onward)           Start     Ordered   03/06/22 0000  Discharge wound care:       Comments: Skin protectant   03/06/22 1121            Major procedures and Radiology Reports - PLEASE review detailed and final reports for all details, in brief -   CT ABDOMEN PELVIS WO CONTRAST  Result Date: 03/03/2022 CLINICAL DATA:  Abdominal pain; generalized weakness EXAM: CT ABDOMEN AND PELVIS WITHOUT CONTRAST TECHNIQUE: Multidetector CT imaging of the abdomen and pelvis was performed following the standard protocol without IV contrast. RADIATION DOSE REDUCTION: This exam was performed according to the departmental dose-optimization program which includes automated exposure control, adjustment of the mA and/or kV according to patient size and/or use of iterative reconstruction technique. COMPARISON:  None Available. FINDINGS: Lower chest: Normal heart size. No pericardial effusion. Coronary artery and mitral annular calcification. Bronchial wall thickening and mucous plugging in the lower lungs. Bibasilar atelectasis or infiltrates. Hepatobiliary: No focal hepatic abnormality. Large gallstone in the gallbladder. No biliary dilation. Pancreas: Unremarkable. Spleen: Unremarkable. Adrenals/Urinary Tract: Unremarkable adrenal glands. Bilateral low-density cystic lesions in the kidneys compatible with benign cysts. No follow-up required. No urinary calculi or hydronephrosis. Stomach/Bowel: Small hiatal hernia. Unremarkable stomach.  Normal caliber large and small bowel. Advanced colonic diverticulosis without evidence of diverticulitis. Vascular/Lymphatic: Aortic atherosclerosis. No enlarged abdominal or pelvic lymph nodes. Reproductive: Unremarkable. Other:  No free intraperitoneal fluid or air. Fat containing right-greater-than-left inguinal hernias. Umbilical hernia containing fat and a knuckle of nonobstructed small bowel. Musculoskeletal: Demineralization. Thoracolumbar spondylosis and degenerative disc disease. Advanced left hip osteoarthritis. No acute osseous abnormality IMPRESSION: 1. No acute abnormality in the abdomen or pelvis. 2. Bronchial wall thickening and mucous plugging. Bibasilar infiltrates may be due to atelectasis, infection, or aspiration. 3. Advanced Colonic diverticulosis without diverticulitis. 4.  Aortic Atherosclerosis (ICD10-I70.0). Electronically Signed   By: Minerva Fester M.D.   On: 03/03/2022 21:05   CT Head Wo Contrast  Result Date: 03/03/2022 CLINICAL DATA:  Generalized weakness EXAM: CT HEAD WITHOUT CONTRAST TECHNIQUE: Contiguous axial images were obtained from the base of the skull through the vertex without intravenous contrast. RADIATION DOSE REDUCTION: This exam was performed according to the departmental dose-optimization program which includes automated exposure control, adjustment of the mA and/or kV according to patient size and/or use of iterative reconstruction technique. COMPARISON:  07/07/2013 FINDINGS: Brain: No evidence of acute infarction, hemorrhage, hydrocephalus, extra-axial collection or mass lesion/mass effect. Cortical atrophy. Mild subcortical white matter and periventricular small vessel ischemic changes. Vascular: Mild intracranial atherosclerosis. Skull: Normal. Negative for fracture or focal lesion. Sinuses/Orbits: The visualized paranasal sinuses are essentially clear. The mastoid air cells are unopacified. Other: None. IMPRESSION: No evidence of acute intracranial abnormality. Atrophy with small vessel ischemic changes. Electronically Signed   By: Charline Bills M.D.   On: 03/03/2022 21:04   DG Pelvis Portable  Result Date: 03/03/2022 CLINICAL DATA:  88 year old male presenting for evaluation of  weakness post fall. EXAM: PORTABLE PELVIS 1-2 VIEWS COMPARISON:  None available. FINDINGS: No evidence of fracture or diastasis about the pelvis. Moderate to marked LEFT hip degenerative changes. Degenerative changes in the lumbar spine. Osteopenia. IMPRESSION: 1. No acute finding in the pelvis. 2. Moderate to marked LEFT hip degenerative changes. 3. Degenerative changes incidentally noted in the lumbar spine. Electronically Signed   By: Donzetta Kohut M.D.   On: 03/03/2022 19:54   DG Chest Port 1 View  Result Date: 03/03/2022 CLINICAL DATA:  A 88 year old male presents for evaluation of generalized weakness post fall. EXAM: PORTABLE CHEST 1 VIEW COMPARISON:  None available. FINDINGS: Cardiomediastinal contours and hilar structures are normal. Lung volumes are mildly low. No lobar consolidation. No sign of pneumothorax. No gross pleural effusion. On limited assessment no acute skeletal findings. IMPRESSION: 1. No acute cardiopulmonary disease. Electronically Signed   By: Donzetta Kohut M.D.   On: 03/03/2022 19:52    Micro Results   Recent Results (from the past 240 hour(s))  Urine Culture     Status: Abnormal   Collection Time: 03/03/22  7:53 PM   Specimen: Urine, Clean Catch  Result Value Ref Range Status   Specimen Description   Final    URINE, CLEAN CATCH Performed at [facility], [address] [place], [place] [address]    Special Requests   Final    NONE Performed at [facility], [address] [place], [place] [address]    Culture >=100,000 COLONIES/mL ESCHERICHIA COLI (A)  Final   Report Status 03/06/2022 FINAL  Final   Organism ID, Bacteria ESCHERICHIA COLI (A)  Final      Susceptibility   Escherichia coli - MIC*    AMPICILLIN <=2 SENSITIVE Sensitive     CEFAZOLIN <=4 SENSITIVE Sensitive  CEFEPIME <=0.12 SENSITIVE Sensitive     CEFTRIAXONE <=0.25 SENSITIVE Sensitive     CIPROFLOXACIN <=0.25 SENSITIVE Sensitive     GENTAMICIN <=1 SENSITIVE Sensitive     IMIPENEM <=0.25  SENSITIVE Sensitive     NITROFURANTOIN <=16 SENSITIVE Sensitive     TRIMETH/SULFA <=20 SENSITIVE Sensitive     AMPICILLIN/SULBACTAM <=2 SENSITIVE Sensitive     PIP/TAZODevon EnergyTIVE Sensitive     * >=100,000 COLONIES/mL ESCHERICHIA COLI   Today   Subjective    Tim Henry today has no new complaints -Voiding well -Eating and drinking well -No fevers -No emesis        Patient has been seen and examined prior to discharge   Objective   Blood pressure (!) 147/85, pulse 75, temperature 98.3 F (36.8 C), temperature source Oral, resp. rate 20, height  (1.803 m), weight 101.5 kg, SpO2 97 %.   Intake/Output Summary (Last 24 hours) at 03/06/2022 1124 Last data filed at 03/06/2022 1026 Gross per 24 hour  Intake 1796.31 ml  Output 3400 ml  Net -1603.69 ml    Exam Gen:- Awake Alert,  in no apparent distress  HEENT:- [place].AT, No sclera icterus Neck-Supple Neck,No JVD,.  Lungs-  CTAB , fair symmetrical air movement CV- S1, S2 normal, regular  Abd-  +ve B.Sounds, Abd Soft, No tenderness, no CVA area tenderness    Extremity/Skin:-Improved edema, chronic venous stasis, pedal pulses present  Psych-affect is appropriate, oriented x3 Neuro-generalized weakness, no new focal deficits, no tremors   Data Review   CBC w Diff:  Lab Results  Component Value Date   WBC 7.5 03/06/2022   HGB 9.4 (L) 03/06/2022   HGB 11.1 (L) 07/07/2013   HCT 29.9 (L) 03/06/2022   HCT 34.4 (L) 07/07/2013   PLT 184 03/06/2022   PLT 196 07/07/2013   LYMPHOPCT 9 03/03/2022   LYMPHOPCT 21.7 07/07/2013   MONOPCT 12 03/03/2022   MONOPCT 9.9 07/07/2013   EOSPCT 0 03/03/2022   EOSPCT 2.5 07/07/2013   BASOPCT 0 03/03/2022   BASOPCT 0.7 07/07/2013   CMP:  Lab Results  Component Value Date   NA 140 03/06/2022   NA 140 07/07/2013   K 3.6 03/06/2022   K 3.0 (L) 07/07/2013   CL 106 03/06/2022   CL 103 07/07/2013   CO2 26 03/06/2022   CO2 33 (H) 07/07/2013   BUN 23 03/06/2022   BUN 15  07/07/2013   CREATININE 1.29 (H) 03/06/2022   CREATININE 0.88 07/07/2013   PROT 6.1 (L) 03/04/2022   PROT 6.8 07/07/2013   ALBUMIN 2.5 (L) 03/06/2022   ALBUMIN 3.1 (L) 07/07/2013   BILITOT 0.8 03/04/2022   BILITOT 0.4 07/07/2013   ALKPHOS 47 03/04/2022   ALKPHOS 86 07/07/2013   AST 26 03/04/2022   AST 12 (L) 07/07/2013   ALT 20 03/04/2022   ALT 14 07/07/2013   Total Discharge time is about 33 minutes  Shon Hale M.D on 03/06/2022 at 11:24 AM  Go to [email or web address] -  for contact info  [facility] - Office  [phone]

## 2022-03-06 NOTE — Discharge Instructions (Signed)
1)Avoid ibuprofen/Advil/Aleve/Motrin/Goody Powders/Naproxen/BC powders/Meloxicam/Diclofenac/Indomethacin and other Nonsteroidal anti-inflammatory medications as these will make you more likely to bleed and can cause stomach ulcers, can also cause Kidney problems.   2)Repeat CBC and BMP blood test with primary care physician in about a week or so

## 2022-03-31 ENCOUNTER — Other Ambulatory Visit: Payer: Self-pay | Admitting: Urology

## 2022-03-31 DIAGNOSIS — N401 Enlarged prostate with lower urinary tract symptoms: Secondary | ICD-10-CM

## 2022-04-05 ENCOUNTER — Encounter: Payer: Self-pay | Admitting: Urology

## 2022-04-05 ENCOUNTER — Ambulatory Visit: Payer: Medicare PPO | Admitting: Urology

## 2022-04-05 VITALS — BP 98/64 | HR 61 | Ht 71.0 in | Wt 229.0 lb

## 2022-04-05 DIAGNOSIS — N401 Enlarged prostate with lower urinary tract symptoms: Secondary | ICD-10-CM | POA: Diagnosis not present

## 2022-04-05 DIAGNOSIS — R350 Frequency of micturition: Secondary | ICD-10-CM | POA: Diagnosis not present

## 2022-04-05 LAB — URINALYSIS, COMPLETE
Bilirubin, UA: NEGATIVE
Glucose, UA: NEGATIVE
Ketones, UA: NEGATIVE
Leukocytes,UA: NEGATIVE
Nitrite, UA: NEGATIVE
Protein,UA: NEGATIVE
RBC, UA: NEGATIVE
Specific Gravity, UA: 1.01 (ref 1.005–1.030)
Urobilinogen, Ur: 0.2 mg/dL (ref 0.2–1.0)
pH, UA: 5 (ref 5.0–7.5)

## 2022-04-05 LAB — MICROSCOPIC EXAMINATION

## 2022-04-05 MED ORDER — TAMSULOSIN HCL 0.4 MG PO CAPS: 0.4000 mg | ORAL_CAPSULE | Freq: Every day | ORAL | 11 refills | Status: DC

## 2022-04-05 MED ORDER — FINASTERIDE 5 MG PO TABS: 5.0000 mg | ORAL_TABLET | Freq: Every day | ORAL | 3 refills | Status: DC

## 2022-04-05 NOTE — Progress Notes (Signed)
04/05/2022 11:16 AM   Tim Henry [birth year] [id number]  Referring provider: Aniceto Boss, MD [address] [place],  [place] [address]  Chief Complaint  Patient presents with   Medication Refill    Urologic history: 1.  BPH with lower urinary tract symptoms -Followed since 2015; initially treated with tamsulosin with significant improvement in his symptoms -Finasteride added for worsening LUTS   2.  Microhematuria evaluation September 2017 -Renal ultrasound bilateral simple cysts -Cystoscopy with trilobar enlargement/prominent hypervascularity   HPI: 88 y.o. male presents for annual follow-up.  He presents today with his daughter  Hospitalized [facility] 11/4-11/11/16/2021 for dehydration and a UTI.  Urine culture grew pansensitive E. coli.  CT abdomen pelvis showed no GU abnormalities.  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)    none since release from hosp    Surgical History: Past Surgical History:  Procedure Laterality Date   APPENDECTOMY     childhood   BACK SURGERY  1963   CATARACT EXTRACTION W/PHACO Left 09/15/2014   Procedure: CATARACT EXTRACTION PHACO AND INTRAOCULAR LENS PLACEMENT (IOC);  Surgeon: Lockie Mola, MD;  Location: [facility];  Service: Ophthalmology;  Laterality: Left;  DIABETIC PT WOULD LIKE TO HAVE ARRIVAL TIME LATE AM   JOINT REPLACEMENT Left    knee   PHOTOCOAGULATION WITH LASER Right  01/23/2016   Procedure: PHOTOCOAGULATION WITH LASER;  Surgeon: Sherald Hess, MD;  Location: [facility];  Service: Ophthalmology;  Laterality: Right;  DIABETIC - oral meds RIGHT Requests arrival 10AM or after    Home Medications:  Allergies as of 04/05/2022   No Known Allergies      Medication List        Accurate as of April 05, 2022 11:16 AM. If you have any questions, ask your nurse or doctor.          albuterol 108 (90 Base) MCG/ACT inhaler Commonly known as: VENTOLIN HFA Inhale 2 puffs into the lungs every 6 (six) hours as needed for wheezing or shortness of breath.   ALPRAZolam 0.5 MG tablet Commonly known as: XANAX Take 0.5 mg by mouth 2 (two) times daily.   aspirin EC 81 MG tablet Take 1 tablet (81 mg total) by mouth daily with breakfast. Swallow whole.   carvedilol 6.25 MG tablet Commonly known as: COREG Take 6.25 mg by mouth 2 (two) times daily with a meal.   dextromethorphan-guaiFENesin 30-600 MG 12hr tablet Commonly known as: MUCINEX DM Take 1 tablet by mouth 2 (two) times daily.   diphenhydrAMINE 25 MG tablet Commonly known as: BENADRYL Take 25 mg by mouth every 6 (six) hours as needed for allergies.   dorzolamide-timolol 2-0.5 % ophthalmic solution Commonly known as: COSOPT Place 1 drop into both eyes 2 (two) times daily.   ferrous sulfate 325 (65 FE) MG tablet Take 325 mg by mouth daily with  breakfast.   finasteride 5 MG tablet Commonly known as: PROSCAR Take 1 tablet (5 mg total) by mouth daily.   furosemide 20 MG tablet Commonly known as: LASIX Take 1 tablet (20 mg total) by mouth daily.   gabapentin 300 MG capsule Commonly known as: NEURONTIN Take 300 mg by mouth 2 (two) times daily. 1 PM, 11 PM   Glucosamine 500 MG Caps Take 3 capsules by mouth every evening.   GLUCOSAMINE HCL PO Take by mouth.   latanoprost 0.005 % ophthalmic solution Commonly known as: XALATAN Place 1 drop into both eyes at bedtime.    metFORMIN 500 MG tablet Commonly known as: GLUCOPHAGE Take 1 tablet (500 mg total) by mouth 2 (two) times daily with a meal. 1 PM, 11 PM   multivitamin capsule Take 1 capsule by mouth daily.   OIL OF OREGANO PO Take by mouth.   omeprazole 40 MG capsule Commonly known as: PRILOSEC Take 40 mg by mouth daily.   PHOSPHATIDYL CHOLINE PO Take 2 tablets by mouth daily.   potassium chloride 10 MEQ tablet Commonly known as: KLOR-CON Take 20 mEq by mouth daily.   PROBIOTIC DAILY PO Take by mouth.   sertraline 100 MG tablet Commonly known as: ZOLOFT Take 100 mg by mouth daily.   simvastatin 10 MG tablet Commonly known as: ZOCOR Take 10 mg by mouth daily. 10 AM   sitaGLIPtin 100 MG tablet Commonly known as: JANUVIA Take 100 mg by mouth daily.   tamsulosin 0.4 MG Caps capsule Commonly known as: FLOMAX Take 1 capsule (0.4 mg total) by mouth daily after breakfast. 1 PM   telmisartan 40 MG tablet Commonly known as: MICARDIS Take 20 mg by mouth daily.   verapamil 240 MG (CO) 24 hr tablet Commonly known as: COVERA HS Take 240 mg by mouth daily.   Vitamin D (Ergocalciferol) 1.25 MG (50000 UNIT) Caps capsule Commonly known as: DRISDOL Take 50,000 Units by mouth every 7 (seven) days. Wednesday        Allergies: No Known Allergies  Family History: Family History  Problem Relation Age of Onset   Heart attack Mother    Diabetes Mother    Heart attack Father    Diabetes Sister     Social History:  reports that he quit smoking about 55 years ago. His smoking use included cigarettes. He has never used smokeless tobacco. He reports that he does not drink alcohol and does not use drugs.   Physical Exam: BP 98/64   Pulse 61   Ht 5\' 11"  (1.803 m)   Wt 229 lb (103.9 kg)   BMI 31.94 kg/m   Constitutional:  Alert and oriented, No acute distress. HEENT: [place] AT Respiratory: Normal respiratory effort, no increased work of breathing. Psychiatric: Normal mood and  affect.   Assessment & Plan:    1. Benign prostatic hyperplasia with urinary frequency Stable Tamsulosin/finasteride refilled Continue annual follow-up   , MD  [facility] [address] [place], [place] [phone]

## 2022-04-18 ENCOUNTER — Ambulatory Visit: Payer: Medicare PPO | Admitting: Urology

## 2022-05-19 ENCOUNTER — Other Ambulatory Visit: Payer: Self-pay

## 2022-05-19 ENCOUNTER — Inpatient Hospital Stay (HOSPITAL_COMMUNITY)
Admission: EM | Admit: 2022-05-19 | Discharge: 2022-05-22 | DRG: 871 | Disposition: A | Discharge status: Home Health | Payer: Medicare PPO | Attending: Internal Medicine | Admitting: Internal Medicine

## 2022-05-19 ENCOUNTER — Emergency Department (HOSPITAL_COMMUNITY): Payer: Medicare PPO

## 2022-05-19 ENCOUNTER — Encounter (HOSPITAL_COMMUNITY): Payer: Self-pay | Admitting: Emergency Medicine

## 2022-05-19 DIAGNOSIS — E119 Type 2 diabetes mellitus without complications: Secondary | ICD-10-CM

## 2022-05-19 DIAGNOSIS — N183 Chronic kidney disease, stage 3 unspecified: Secondary | ICD-10-CM | POA: Diagnosis present

## 2022-05-19 DIAGNOSIS — J9601 Acute respiratory failure with hypoxia: Secondary | ICD-10-CM | POA: Diagnosis present

## 2022-05-19 DIAGNOSIS — Z79899 Other long term (current) drug therapy: Secondary | ICD-10-CM

## 2022-05-19 DIAGNOSIS — F0393 Unspecified dementia, unspecified severity, with mood disturbance: Secondary | ICD-10-CM | POA: Diagnosis present

## 2022-05-19 DIAGNOSIS — N400 Enlarged prostate without lower urinary tract symptoms: Secondary | ICD-10-CM | POA: Diagnosis present

## 2022-05-19 DIAGNOSIS — Z7982 Long term (current) use of aspirin: Secondary | ICD-10-CM

## 2022-05-19 DIAGNOSIS — R579 Shock, unspecified: Principal | ICD-10-CM

## 2022-05-19 DIAGNOSIS — U071 COVID-19: Secondary | ICD-10-CM | POA: Diagnosis not present

## 2022-05-19 DIAGNOSIS — I129 Hypertensive chronic kidney disease with stage 1 through stage 4 chronic kidney disease, or unspecified chronic kidney disease: Secondary | ICD-10-CM | POA: Diagnosis present

## 2022-05-19 DIAGNOSIS — E7800 Pure hypercholesterolemia, unspecified: Secondary | ICD-10-CM | POA: Diagnosis present

## 2022-05-19 DIAGNOSIS — R197 Diarrhea, unspecified: Secondary | ICD-10-CM

## 2022-05-19 DIAGNOSIS — F32A Depression, unspecified: Secondary | ICD-10-CM | POA: Diagnosis present

## 2022-05-19 DIAGNOSIS — J1282 Pneumonia due to coronavirus disease 2019: Secondary | ICD-10-CM | POA: Diagnosis present

## 2022-05-19 DIAGNOSIS — Z8249 Family history of ischemic heart disease and other diseases of the circulatory system: Secondary | ICD-10-CM

## 2022-05-19 DIAGNOSIS — K219 Gastro-esophageal reflux disease without esophagitis: Secondary | ICD-10-CM | POA: Diagnosis present

## 2022-05-19 DIAGNOSIS — Z7984 Long term (current) use of oral hypoglycemic drugs: Secondary | ICD-10-CM

## 2022-05-19 DIAGNOSIS — A419 Sepsis, unspecified organism: Secondary | ICD-10-CM | POA: Insufficient documentation

## 2022-05-19 DIAGNOSIS — N179 Acute kidney failure, unspecified: Secondary | ICD-10-CM | POA: Diagnosis present

## 2022-05-19 DIAGNOSIS — R6521 Severe sepsis with septic shock: Secondary | ICD-10-CM | POA: Diagnosis not present

## 2022-05-19 DIAGNOSIS — A4189 Other specified sepsis: Secondary | ICD-10-CM | POA: Diagnosis present

## 2022-05-19 DIAGNOSIS — F0394 Unspecified dementia, unspecified severity, with anxiety: Secondary | ICD-10-CM | POA: Diagnosis present

## 2022-05-19 DIAGNOSIS — Z87891 Personal history of nicotine dependence: Secondary | ICD-10-CM | POA: Diagnosis not present

## 2022-05-19 DIAGNOSIS — E1122 Type 2 diabetes mellitus with diabetic chronic kidney disease: Secondary | ICD-10-CM | POA: Diagnosis present

## 2022-05-19 DIAGNOSIS — E118 Type 2 diabetes mellitus with unspecified complications: Secondary | ICD-10-CM | POA: Diagnosis present

## 2022-05-19 DIAGNOSIS — D631 Anemia in chronic kidney disease: Secondary | ICD-10-CM | POA: Diagnosis present

## 2022-05-19 DIAGNOSIS — R112 Nausea with vomiting, unspecified: Secondary | ICD-10-CM | POA: Diagnosis present

## 2022-05-19 DIAGNOSIS — A0839 Other viral enteritis: Secondary | ICD-10-CM | POA: Diagnosis present

## 2022-05-19 DIAGNOSIS — E860 Dehydration: Secondary | ICD-10-CM | POA: Diagnosis present

## 2022-05-19 DIAGNOSIS — Z833 Family history of diabetes mellitus: Secondary | ICD-10-CM | POA: Diagnosis not present

## 2022-05-19 DIAGNOSIS — E8770 Fluid overload, unspecified: Secondary | ICD-10-CM | POA: Diagnosis not present

## 2022-05-19 LAB — COMPREHENSIVE METABOLIC PANEL
ALT: 16 U/L (ref 0–44)
AST: 28 U/L (ref 15–41)
Albumin: 3 g/dL — ABNORMAL LOW (ref 3.5–5.0)
Alkaline Phosphatase: 49 U/L (ref 38–126)
Anion gap: 8 (ref 5–15)
BUN: 33 mg/dL — ABNORMAL HIGH (ref 8–23)
CO2: 25 mmol/L (ref 22–32)
Calcium: 8.1 mg/dL — ABNORMAL LOW (ref 8.9–10.3)
Chloride: 100 mmol/L (ref 98–111)
Creatinine, Ser: 2.26 mg/dL — ABNORMAL HIGH (ref 0.61–1.24)
GFR, Estimated: 27 mL/min — ABNORMAL LOW (ref >60)
Glucose, Bld: 210 mg/dL — ABNORMAL HIGH (ref 70–99)
Potassium: 4.2 mmol/L (ref 3.5–5.1)
Sodium: 133 mmol/L — ABNORMAL LOW (ref 135–145)
Total Bilirubin: 1 mg/dL (ref 0.3–1.2)
Total Protein: 6 g/dL — ABNORMAL LOW (ref 6.5–8.1)

## 2022-05-19 LAB — TROPONIN I (HIGH SENSITIVITY)
Troponin I (High Sensitivity): 7 ng/L (ref <18)
Troponin I (High Sensitivity): 6 ng/L (ref <18)

## 2022-05-19 LAB — MAGNESIUM: Magnesium: 1.7 mg/dL (ref 1.7–2.4)

## 2022-05-19 LAB — I-STAT CHEM 8, ED
BUN: 29 mg/dL — ABNORMAL HIGH (ref 8–23)
Calcium, Ion: 1.14 mmol/L — ABNORMAL LOW (ref 1.15–1.40)
Chloride: 101 mmol/L (ref 98–111)
Creatinine, Ser: 2.4 mg/dL — ABNORMAL HIGH (ref 0.61–1.24)
Glucose, Bld: 203 mg/dL — ABNORMAL HIGH (ref 70–99)
HCT: 32 % — ABNORMAL LOW (ref 39.0–52.0)
Hemoglobin: 10.9 g/dL — ABNORMAL LOW (ref 13.0–17.0)
Potassium: 4.4 mmol/L (ref 3.5–5.1)
Sodium: 137 mmol/L (ref 135–145)
TCO2: 25 mmol/L (ref 22–32)

## 2022-05-19 LAB — CBC WITH DIFFERENTIAL/PLATELET
Abs Immature Granulocytes: 0.1 10*3/uL — ABNORMAL HIGH (ref 0.00–0.07)
Basophils Absolute: 0 10*3/uL (ref 0.0–0.1)
Basophils Relative: 0 %
Eosinophils Absolute: 0.2 10*3/uL (ref 0.0–0.5)
Eosinophils Relative: 1 %
HCT: 31.6 % — ABNORMAL LOW (ref 39.0–52.0)
Hemoglobin: 10.1 g/dL — ABNORMAL LOW (ref 13.0–17.0)
Immature Granulocytes: 1 %
Lymphocytes Relative: 17 %
Lymphs Abs: 1.8 10*3/uL (ref 0.7–4.0)
MCH: 33.1 pg (ref 26.0–34.0)
MCHC: 32 g/dL (ref 30.0–36.0)
MCV: 103.6 fL — ABNORMAL HIGH (ref 80.0–100.0)
Monocytes Absolute: 1.1 10*3/uL — ABNORMAL HIGH (ref 0.1–1.0)
Monocytes Relative: 10 %
Neutro Abs: 7.9 10*3/uL — ABNORMAL HIGH (ref 1.7–7.7)
Neutrophils Relative %: 71 %
Platelets: 129 10*3/uL — ABNORMAL LOW (ref 150–400)
RBC: 3.05 MIL/uL — ABNORMAL LOW (ref 4.22–5.81)
RDW: 13.2 % (ref 11.5–15.5)
WBC: 11.1 10*3/uL — ABNORMAL HIGH (ref 4.0–10.5)
nRBC: 0 % (ref 0.0–0.2)

## 2022-05-19 LAB — RESP PANEL BY RT-PCR (RSV, FLU A&B, COVID)  RVPGX2
Influenza A by PCR: NEGATIVE
Influenza B by PCR: NEGATIVE
Resp Syncytial Virus by PCR: NEGATIVE
SARS Coronavirus 2 by RT PCR: POSITIVE — AB

## 2022-05-19 LAB — LACTIC ACID, PLASMA
Lactic Acid, Venous: 2 mmol/L (ref 0.5–1.9)
Lactic Acid, Venous: 3.2 mmol/L (ref 0.5–1.9)
Lactic Acid, Venous: 3 mmol/L (ref 0.5–1.9)

## 2022-05-19 LAB — BLOOD GAS, VENOUS
Acid-Base Excess: 1.4 mmol/L (ref 0.0–2.0)
Bicarbonate: 28.1 mmol/L — ABNORMAL HIGH (ref 20.0–28.0)
Drawn by: 42942
FIO2: 36 %
O2 Saturation: 26.1 %
Patient temperature: 34.8
pCO2, Ven: 47 mmHg (ref 44–60)
pH, Ven: 7.37 (ref 7.25–7.43)
pO2, Ven: <31 mmHg — CL (ref 32–45)

## 2022-05-19 LAB — PROTIME-INR
INR: 1.2 (ref 0.8–1.2)
INR: 1.2 (ref 0.8–1.2)
Prothrombin Time: 14.9 seconds (ref 11.4–15.2)
Prothrombin Time: 15.1 seconds (ref 11.4–15.2)

## 2022-05-19 LAB — APTT: aPTT: 31 seconds (ref 24–36)

## 2022-05-19 LAB — CBG MONITORING, ED: Glucose-Capillary: 161 mg/dL — ABNORMAL HIGH (ref 70–99)

## 2022-05-19 MED ORDER — SODIUM CHLORIDE 0.9 % IV SOLN
2.0000 g | INTRAVENOUS | Status: DC
  Administered 2022-05-19 – 2022-05-20 (×2): 2 g via INTRAVENOUS
  Filled 2022-05-19 (×2): qty 12.5

## 2022-05-19 MED ORDER — LATANOPROST 0.005 % OP SOLN
1.0000 [drp] | Freq: Every day | OPHTHALMIC | Status: DC
  Administered 2022-05-20 (×2): 1 [drp] via OPHTHALMIC

## 2022-05-19 MED ORDER — DORZOLAMIDE HCL-TIMOLOL MAL 2-0.5 % OP SOLN
1.0000 [drp] | Freq: Two times a day (BID) | OPHTHALMIC | Status: DC
  Administered 2022-05-20 – 2022-05-22 (×5): 1 [drp] via OPHTHALMIC
  Filled 2022-05-19: qty 10

## 2022-05-19 MED ORDER — INSULIN ASPART 100 UNIT/ML IJ SOLN
0.0000 [IU] | Freq: Three times a day (TID) | INTRAMUSCULAR | Status: DC
  Administered 2022-05-20: 3 [IU] via SUBCUTANEOUS
  Administered 2022-05-20: 2 [IU] via SUBCUTANEOUS
  Administered 2022-05-20 – 2022-05-21 (×2): 3 [IU] via SUBCUTANEOUS
  Administered 2022-05-21: 7 [IU] via SUBCUTANEOUS
  Administered 2022-05-21 – 2022-05-22 (×2): 2 [IU] via SUBCUTANEOUS

## 2022-05-19 MED ORDER — INSULIN ASPART 100 UNIT/ML IJ SOLN
0.0000 [IU] | Freq: Every day | INTRAMUSCULAR | Status: DC
  Administered 2022-05-19 – 2022-05-20 (×2): 3 [IU] via SUBCUTANEOUS

## 2022-05-19 MED ORDER — VANCOMYCIN HCL IN DEXTROSE 1-5 GM/200ML-% IV SOLN: 1000.0000 mg | Freq: Once | INTRAVENOUS | Status: DC

## 2022-05-19 MED ORDER — LACTATED RINGERS IV SOLN: INTRAVENOUS | Status: DC

## 2022-05-19 MED ORDER — LACTATED RINGERS IV BOLUS (SEPSIS): 1000.0000 mL | Freq: Once | INTRAVENOUS | Status: DC

## 2022-05-19 MED ORDER — HEPARIN SODIUM (PORCINE) 5000 UNIT/ML IJ SOLN
5000.0000 [IU] | Freq: Three times a day (TID) | INTRAMUSCULAR | Status: DC
  Administered 2022-05-19 – 2022-05-21 (×6): 5000 [IU] via SUBCUTANEOUS
  Filled 2022-05-19 (×6): qty 1

## 2022-05-19 MED ORDER — METHYLPREDNISOLONE SODIUM SUCC 125 MG IJ SOLR: 80.0000 mg | Freq: Three times a day (TID) | INTRAMUSCULAR | Status: DC

## 2022-05-19 MED ORDER — NOREPINEPHRINE 4 MG/250ML-% IV SOLN
0.0000 ug/min | INTRAVENOUS | Status: DC
  Administered 2022-05-19: 5 ug/min via INTRAVENOUS
  Filled 2022-05-19: qty 250

## 2022-05-19 MED ORDER — SODIUM CHLORIDE 0.9 % IV SOLN
100.0000 mg | Freq: Every day | INTRAVENOUS | Status: AC
  Administered 2022-05-20 – 2022-05-21 (×2): 100 mg via INTRAVENOUS
  Filled 2022-05-19 (×2): qty 20

## 2022-05-19 MED ORDER — ATROPINE SULFATE 1 MG/ML IV SOLN
INTRAVENOUS | Status: AC
  Administered 2022-05-19: 1 mg via INTRAVENOUS
  Filled 2022-05-19: qty 1

## 2022-05-19 MED ORDER — METHYLPREDNISOLONE SODIUM SUCC 125 MG IJ SOLR
120.0000 mg | Freq: Two times a day (BID) | INTRAMUSCULAR | Status: DC
  Administered 2022-05-19 – 2022-05-20 (×2): 120 mg via INTRAVENOUS
  Filled 2022-05-19 (×2): qty 2

## 2022-05-19 MED ORDER — ALBUTEROL SULFATE (2.5 MG/3ML) 0.083% IN NEBU
2.5000 mg | INHALATION_SOLUTION | Freq: Four times a day (QID) | RESPIRATORY_TRACT | Status: DC
  Administered 2022-05-19 – 2022-05-20 (×3): 2.5 mg via RESPIRATORY_TRACT
  Filled 2022-05-19 (×3): qty 3

## 2022-05-19 MED ORDER — ALBUTEROL SULFATE (2.5 MG/3ML) 0.083% IN NEBU: 2.5000 mg | INHALATION_SOLUTION | RESPIRATORY_TRACT | Status: DC | PRN

## 2022-05-19 MED ORDER — ACETAMINOPHEN 650 MG RE SUPP: 650.0000 mg | Freq: Four times a day (QID) | RECTAL | Status: DC | PRN

## 2022-05-19 MED ORDER — SODIUM CHLORIDE 0.9 % IV SOLN
100.0000 mg | Freq: Once | INTRAVENOUS | Status: AC
  Administered 2022-05-19: 100 mg via INTRAVENOUS
  Filled 2022-05-19: qty 20

## 2022-05-19 MED ORDER — ATROPINE SULFATE 1 MG/ML IV SOLN: 1.0000 mg | Freq: Once | INTRAVENOUS | Status: AC

## 2022-05-19 MED ORDER — DEXAMETHASONE SODIUM PHOSPHATE 10 MG/ML IJ SOLN: 10.0000 mg | INTRAMUSCULAR | Status: DC

## 2022-05-19 MED ORDER — IPRATROPIUM BROMIDE 0.02 % IN SOLN
0.5000 mg | Freq: Four times a day (QID) | RESPIRATORY_TRACT | Status: DC
  Administered 2022-05-19 – 2022-05-20 (×3): 0.5 mg via RESPIRATORY_TRACT
  Filled 2022-05-19 (×3): qty 2.5

## 2022-05-19 MED ORDER — ONDANSETRON HCL 4 MG PO TABS: 4.0000 mg | ORAL_TABLET | Freq: Four times a day (QID) | ORAL | Status: DC | PRN

## 2022-05-19 MED ORDER — LACTATED RINGERS IV BOLUS (SEPSIS)
1000.0000 mL | Freq: Once | INTRAVENOUS | Status: AC
  Administered 2022-05-19 (×2): 1000 mL via INTRAVENOUS

## 2022-05-19 MED ORDER — SODIUM CHLORIDE 0.9 % IV SOLN: 200.0000 mg | Freq: Once | INTRAVENOUS | Status: DC

## 2022-05-19 MED ORDER — VANCOMYCIN HCL 1500 MG/300ML IV SOLN
1500.0000 mg | Freq: Once | INTRAVENOUS | Status: AC
  Administered 2022-05-20: 1500 mg via INTRAVENOUS
  Filled 2022-05-19: qty 300

## 2022-05-19 MED ORDER — CHLORHEXIDINE GLUCONATE CLOTH 2 % EX PADS
6.0000 | MEDICATED_PAD | Freq: Every day | CUTANEOUS | Status: DC
  Administered 2022-05-19 – 2022-05-20 (×2): 6 via TOPICAL

## 2022-05-19 MED ORDER — LACTATED RINGERS IV SOLN: INTRAVENOUS | Status: AC

## 2022-05-19 MED ORDER — SODIUM CHLORIDE 0.9 % IV SOLN: 2.0000 g | Freq: Once | INTRAVENOUS | Status: DC

## 2022-05-19 MED ORDER — ACETAMINOPHEN 325 MG PO TABS: 650.0000 mg | ORAL_TABLET | Freq: Four times a day (QID) | ORAL | Status: DC | PRN

## 2022-05-19 MED ORDER — METRONIDAZOLE 500 MG/100ML IV SOLN
500.0000 mg | Freq: Two times a day (BID) | INTRAVENOUS | Status: DC
  Administered 2022-05-19 – 2022-05-22 (×6): 500 mg via INTRAVENOUS
  Filled 2022-05-19 (×6): qty 100

## 2022-05-19 MED ORDER — SODIUM CHLORIDE 0.9 % IV SOLN: 100.0000 mg | Freq: Every day | INTRAVENOUS | Status: DC

## 2022-05-19 MED ORDER — ALPRAZOLAM 0.5 MG PO TABS
0.5000 mg | ORAL_TABLET | Freq: Two times a day (BID) | ORAL | Status: DC
  Administered 2022-05-19 – 2022-05-22 (×6): 0.5 mg via ORAL
  Filled 2022-05-19 (×6): qty 1

## 2022-05-19 MED ORDER — SERTRALINE HCL 50 MG PO TABS
100.0000 mg | ORAL_TABLET | Freq: Every day | ORAL | Status: DC
  Administered 2022-05-19 – 2022-05-22 (×4): 100 mg via ORAL
  Filled 2022-05-19 (×4): qty 2

## 2022-05-19 MED ORDER — SODIUM CHLORIDE 0.9 % IV SOLN
2.0000 g | INTRAVENOUS | Status: DC
  Administered 2022-05-19: 2 g via INTRAVENOUS
  Filled 2022-05-19: qty 20

## 2022-05-19 MED ORDER — EPINEPHRINE HCL 5 MG/250ML IV SOLN IN NS
0.5000 ug/min | INTRAVENOUS | Status: DC
  Administered 2022-05-19: 0.5 ug/min via INTRAVENOUS
  Filled 2022-05-19: qty 250

## 2022-05-19 MED ORDER — VANCOMYCIN HCL IN DEXTROSE 1-5 GM/200ML-% IV SOLN: 1000.0000 mg | INTRAVENOUS | Status: DC

## 2022-05-19 MED ORDER — ONDANSETRON HCL 4 MG/2ML IJ SOLN: 4.0000 mg | Freq: Four times a day (QID) | INTRAMUSCULAR | Status: DC | PRN

## 2022-05-19 MED ORDER — METRONIDAZOLE 500 MG/100ML IV SOLN: 500.0000 mg | Freq: Once | INTRAVENOUS | Status: DC

## 2022-05-19 MED ORDER — PANTOPRAZOLE SODIUM 40 MG PO TBEC
40.0000 mg | DELAYED_RELEASE_TABLET | Freq: Every day | ORAL | Status: DC
  Administered 2022-05-19 – 2022-05-22 (×4): 40 mg via ORAL
  Filled 2022-05-19 (×4): qty 1

## 2022-05-19 NOTE — ED Notes (Signed)
Date and time results received: 05/19/22 @1904  (use smartphrase ".now" to insert current time)  Test: Lactic Critical Value: 2.0  Name of Provider Notified: Dr Doren Custard  Orders Received? Or Actions Taken?: Orders Received - See Orders for details

## 2022-05-19 NOTE — ED Triage Notes (Signed)
Per EMS pt coming from home- family called out for lethargy. Patient tested positive for covid yesterday, having N/V/D. EMS stood patient up to clean him and he had syncopal episode. Also had initial sat of 89% and placed on 4L [place]. BP 77/31 and given 1L [place].

## 2022-05-19 NOTE — Progress Notes (Addendum)
Ok to change cefepime to ceftriaxone 2g IV q24 x7 per Dr. Doren Custard for empiric secondary bacterial infection with COVID  Addendum  Dr. [facility] would like to add vanc/cefepime empirically to cover for sepsis.  Vanc 1.5g IV x1 - dose by levels Cefepime 2g IV q12  Onnie Boer, PharmD, [place], AAHIVP, CPP Infectious Disease Pharmacist 05/19/2022 8:00 PM

## 2022-05-19 NOTE — ED Provider Notes (Signed)
[place] EMERGENCY DEPARTMENT AT [facility] Provider Note   CSN: [id number] Arrival date & time: 05/19/22  1755     History  Chief Complaint  Patient presents with   Loss of Consciousness    Tim Henry is a 89 y.o. male.   Loss of Consciousness Associated symptoms: nausea, vomiting and weakness (Generalized)   Patient presents from home via EMS.  Initially he is provided by EMS.  EMS reports that they were called out for lethargy.  When they arrived on scene, patient was hypotensive.  He had a witnessed syncopal episode when standing.  He reportedly tested positive for COVID-19 yesterday.  Today he has had nausea, vomiting, and diarrhea.  History per son: Household was tested for COVID-19 2 days ago.  The patient, himself, did test positive.  His lethargy began today.  He had a difficult time ambulating with a walker.  He underwent a syncopal episode at home.  He has had a small amount of diarrhea.     Home Medications Prior to Admission medications   Medication Sig Start Date End Date Taking? Authorizing Provider  albuterol (VENTOLIN HFA) 108 (90 Base) MCG/ACT inhaler Inhale 2 puffs into the lungs every 6 (six) hours as needed for wheezing or shortness of breath. 03/06/22   Shon Hale, MD  ALPRAZolam Prudy Feeler) 0.5 MG tablet Take 0.5 mg by mouth 2 (two) times daily.    [provider]  aspirin EC 81 MG tablet Take 1 tablet (81 mg total) by mouth daily with breakfast. Swallow whole. 03/06/22   Shon Hale, MD  carvedilol (COREG) 6.25 MG tablet Take 6.25 mg by mouth 2 (two) times daily with a meal.  11/20/17   [provider]  dextromethorphan-guaiFENesin (MUCINEX DM) 30-600 MG 12hr tablet Take 1 tablet by mouth 2 (two) times daily. 03/06/22   Shon Hale, MD  diphenhydrAMINE (BENADRYL) 25 MG tablet Take 25 mg by mouth every 6 (six) hours as needed for allergies.    [provider]  dorzolamide-timolol (COSOPT) 22.3-6.8 MG/ML  ophthalmic solution Place 1 drop into both eyes 2 (two) times daily.    [provider]  ferrous sulfate 325 (65 FE) MG tablet Take 325 mg by mouth daily with breakfast.    [provider]  finasteride (PROSCAR) 5 MG tablet Take 1 tablet (5 mg total) by mouth daily. 04/05/22   Stoioff, Verna Czech, MD  furosemide (LASIX) 20 MG tablet Take 1 tablet (20 mg total) by mouth daily. 03/06/22   Shon Hale, MD  gabapentin (NEURONTIN) 300 MG capsule Take 300 mg by mouth 2 (two) times daily. 1 PM, 11 PM    [provider]  Glucosamine 500 MG CAPS Take 3 capsules by mouth every evening.    [provider]  GLUCOSAMINE HCL PO Take by mouth.    [provider]  latanoprost (XALATAN) 0.005 % ophthalmic solution Place 1 drop into both eyes at bedtime.    [provider]  metFORMIN (GLUCOPHAGE) 500 MG tablet Take 1 tablet (500 mg total) by mouth 2 (two) times daily with a meal. 1 PM, 11 PM 03/06/22   Emokpae, Courage, MD  Multiple Vitamin (MULTIVITAMIN) capsule Take 1 capsule by mouth daily.    [provider]  OIL OF OREGANO PO Take by mouth.    [provider]  omeprazole (PRILOSEC) 40 MG capsule Take 40 mg by mouth daily. 02/07/18   [provider]  PHOSPHATIDYL CHOLINE PO Take 2 tablets by mouth  daily.    [provider]  potassium chloride (K-DUR) 10 MEQ tablet Take 20 mEq by mouth daily.    [provider]  Probiotic Product (PROBIOTIC DAILY PO) Take by mouth.    [provider]  sertraline (ZOLOFT) 100 MG tablet Take 100 mg by mouth daily.    [provider]  simvastatin (ZOCOR) 10 MG tablet Take 10 mg by mouth daily. 10 AM    [provider]  sitaGLIPtin (JANUVIA) 100 MG tablet Take 100 mg by mouth daily.    [provider]  tamsulosin (FLOMAX) 0.4 MG CAPS capsule Take 1 capsule (0.4 mg total) by mouth daily after breakfast. 1 PM 04/05/22   Stoioff, Verna Czech, MD  telmisartan  (MICARDIS) 40 MG tablet Take 20 mg by mouth daily. 11/20/17   [provider]  verapamil (COVERA HS) 240 MG (CO) 24 hr tablet Take 240 mg by mouth daily.    [provider]  Vitamin D, Ergocalciferol, (DRISDOL) 1.25 MG (50000 UT) CAPS capsule Take 50,000 Units by mouth every 7 (seven) days. Wednesday 02/07/18   [provider]      Allergies    Patient has no known allergies.    Review of Systems   Review of Systems  Constitutional:  Positive for chills and fatigue.  Cardiovascular:  Positive for syncope.  Gastrointestinal:  Positive for diarrhea, nausea and vomiting.  Neurological:  Positive for syncope and weakness (Generalized).  All other systems reviewed and are negative.   Physical Exam Updated Vital Signs BP (!) 106/57   Pulse 63   Temp 98.2 F (36.8 C) (Rectal)   Resp (!) 24   Ht 5\' 11"  (1.803 m)   Wt 103.9 kg   SpO2 100%   BMI 31.94 kg/m  Physical Exam Vitals and nursing note reviewed.  Constitutional:      General: He is not in acute distress.    Appearance: He is well-developed and normal weight. He is ill-appearing. He is not toxic-appearing or diaphoretic.  HENT:     Head: Normocephalic and atraumatic.     Right Ear: External ear normal.     Left Ear: External ear normal.     Nose: Nose normal.     Mouth/Throat:     Mouth: Mucous membranes are moist.  Eyes:     Extraocular Movements: Extraocular movements intact.     Conjunctiva/sclera: Conjunctivae normal.  Cardiovascular:     Rate and Rhythm: Regular rhythm. Bradycardia present.     Heart sounds: No murmur heard. Pulmonary:     Effort: Pulmonary effort is normal. No respiratory distress.     Breath sounds: Normal breath sounds. No wheezing or rales.  Chest:     Chest wall: No tenderness.  Abdominal:     General: There is no distension.     Palpations: Abdomen is soft.     Tenderness: There is no abdominal tenderness.  Musculoskeletal:        General: No swelling. Normal  range of motion.     Cervical back: Normal range of motion and neck supple.     Right lower leg: No edema.     Left lower leg: No edema.  Skin:    General: Skin is warm and dry.     Capillary Refill: Capillary refill takes less than 2 seconds.     Coloration: Skin is pale. Skin is not jaundiced.  Neurological:     General: No focal deficit present.     Mental  Status: He is alert and oriented to person, place, and time.     Cranial Nerves: No cranial nerve deficit.     Sensory: No sensory deficit.     Motor: No weakness.     Coordination: Coordination normal.  Psychiatric:        Mood and Affect: Mood normal.        Behavior: Behavior normal.     ED Results / Procedures / Treatments   Labs (all labs ordered are listed, but only abnormal results are displayed) Labs Reviewed  RESP PANEL BY RT-PCR (RSV, FLU A&B, COVID)  [id number] - Abnormal; Notable for the following components:      Result Value   SARS Coronavirus 2 by RT PCR POSITIVE (*)    All other components within normal limits  LACTIC ACID, PLASMA - Abnormal; Notable for the following components:   Lactic Acid, Venous 2.0 (*)    All other components within normal limits  LACTIC ACID, PLASMA - Abnormal; Notable for the following components:   Lactic Acid, Venous 3.2 (*)    All other components within normal limits  COMPREHENSIVE METABOLIC PANEL - Abnormal; Notable for the following components:   Sodium 133 (*)    Glucose, Bld 210 (*)    BUN 33 (*)    Creatinine, Ser 2.26 (*)    Calcium 8.1 (*)    Total Protein 6.0 (*)    Albumin 3.0 (*)    GFR, Estimated 27 (*)    All other components within normal limits  CBC WITH DIFFERENTIAL/PLATELET - Abnormal; Notable for the following components:   WBC 11.1 (*)    RBC 3.05 (*)    Hemoglobin 10.1 (*)    HCT 31.6 (*)    MCV 103.6 (*)    Platelets 129 (*)    Neutro Abs 7.9 (*)    Monocytes Absolute 1.1 (*)    Abs Immature Granulocytes 0.10 (*)    All other components within  normal limits  BLOOD GAS, VENOUS - Abnormal; Notable for the following components:   pO2, Ven <31 (*)    Bicarbonate 28.1 (*)    All other components within normal limits  I-STAT CHEM 8, ED - Abnormal; Notable for the following components:   BUN 29 (*)    Creatinine, Ser 2.40 (*)    Glucose, Bld 203 (*)    Calcium, Ion 1.14 (*)    Hemoglobin 10.9 (*)    HCT 32.0 (*)    All other components within normal limits  CBG MONITORING, ED - Abnormal; Notable for the following components:   Glucose-Capillary 161 (*)    All other components within normal limits  CULTURE, BLOOD (ROUTINE X 2)  CULTURE, BLOOD (ROUTINE X 2)  URINE CULTURE  PROTIME-INR  MAGNESIUM  URINALYSIS, ROUTINE W REFLEX MICROSCOPIC  LACTIC ACID, PLASMA  LACTIC ACID, PLASMA  TROPONIN I (HIGH SENSITIVITY)  TROPONIN I (HIGH SENSITIVITY)    EKG EKG Interpretation  Date/Time:  Saturday May 19 2022 18:00:29 EST Ventricular Rate:  55 PR Interval:    QRS Duration: 90 QT Interval:  557 QTC Calculation: 533 R Axis:   12 Text Interpretation: indeterminate rhythm Borderline T wave abnormalities Prolonged QT interval Confirmed by Gloris Manchesterixon, Keegen Heffern ([id number]) on 05/19/2022 9:41:24 PM  Radiology CT CHEST ABDOMEN PELVIS WO CONTRAST  Result Date: 05/19/2022 CLINICAL DATA:  Sepsis, tested positive for COVID-19 yesterday, nausea, vomiting, diarrhea, hypoxemia EXAM: CT CHEST, ABDOMEN AND PELVIS WITHOUT CONTRAST TECHNIQUE: Multidetector CT imaging of the chest, abdomen and  pelvis was performed following the standard protocol without IV contrast. RADIATION DOSE REDUCTION: This exam was performed according to the departmental dose-optimization program which includes automated exposure control, adjustment of the mA and/or kV according to patient size and/or use of iterative reconstruction technique. COMPARISON:  03/03/2022 CT abdomen and pelvis FINDINGS: CT CHEST FINDINGS Cardiovascular: Atherosclerotic calcifications aorta, proximal great vessels,  and coronary arteries. Aneurysmal dilatation ascending thoracic aorta 4.1 cm transverse image 25. Heart size normal. No pericardial effusion. Mediastinum/Nodes: Question small hiatal hernia. Esophagus normal. Base of cervical region normal appearance. No thoracic adenopathy. Lungs/Pleura: Peribronchial thickening. Dependent atelectasis BILATERAL lower lobes, less in upper lobes. No definite infiltrate, pleural effusion, or pneumothorax. Musculoskeletal: No acute osseous findings. CT ABDOMEN PELVIS FINDINGS Hepatobiliary: 2.3 cm diameter gallstone within gallbladder. Gallbladder and liver otherwise normal appearance. Pancreas: Normal appearance Spleen: Normal appearance Adrenals/Urinary Tract: Adrenal glands normal appearance. BILATERAL renal cysts unchanged, largest 2.2 cm diameter anterior LEFT kidney image 54; no follow-up imaging recommended. No hydronephrosis, hydroureter or urinary tract calcification. Bladder unremarkable Stomach/Bowel: Diffuse colonic diverticulosis without evidence of diverticulitis. Wall thickening of the ascending colon through hepatic flexure of colon question colitis. Stomach unremarkable. Mild fecalization of small bowel contents distally though no point of obstruction, bowel wall thickening, or small bowel dilatation is identified. Vascular/Lymphatic: Tortuous abdominal aorta with scattered atherosclerotic calcifications. Aorta normal caliber. Additional atherosclerotic calcifications within iliac arteries. No adenopathy. Reproductive: Minimal prostatic enlargement for age Other: No free air or free fluid. RIGHT inguinal and small umbilical hernias containing fat. Musculoskeletal: Degenerative changes lumbar spine and LEFT hip joint IMPRESSION: Bibasilar atelectasis without definite infiltrate. Diffuse colonic diverticulosis without evidence of diverticulitis. Wall thickening of the ascending colon through hepatic flexure of colon question colitis; this could be due to infection,  inflammatory bowel disease, or less likely ischemia. Cholelithiasis. RIGHT inguinal and small umbilical hernias containing fat. Aneurysmal dilatation of ascending thoracic aorta 4.1 cm transverse, recommendation below. Recommend annual imaging followup by CTA or MRA. This recommendation follows 2010 ACCF/AHA/AATS/ACR/ASA/SCA/SCAI/SIR/STS/SVM Guidelines for the Diagnosis and Management of Patients with Thoracic Aortic Disease. Circulation. 2010; 121: L244-W102. Aortic aneurysm NOS (ICD10-I71.9) Aortic Atherosclerosis (ICD10-I70.0). Aortic aneurysm NOS (ICD10-I71.9). Electronically Signed   By: Lavonia Dana M.D.   On: 05/19/2022 21:12   CT Head Wo Contrast  Result Date: 05/19/2022 CLINICAL DATA:  Syncope EXAM: CT HEAD WITHOUT CONTRAST TECHNIQUE: Contiguous axial images were obtained from the base of the skull through the vertex without intravenous contrast. RADIATION DOSE REDUCTION: This exam was performed according to the departmental dose-optimization program which includes automated exposure control, adjustment of the mA and/or kV according to patient size and/or use of iterative reconstruction technique. COMPARISON:  March 03, 2022 FINDINGS: Brain: There is mild cerebral atrophy with widening of the extra-axial spaces and ventricular dilatation. There are areas of decreased attenuation within the white matter tracts of the supratentorial brain, consistent with microvascular disease changes. Vascular: No hyperdense vessel or unexpected calcification. Skull: Normal. Negative for fracture or focal lesion. Sinuses/Orbits: There is moderate to marked severity bilateral ethmoid sinus mucosal thickening. Other: None. IMPRESSION: 1. No acute intracranial abnormality. 2. Generalized cerebral atrophy with widening of the extra-axial spaces and ventricular dilatation. 3. Moderate to marked severity bilateral ethmoid sinus disease. Electronically Signed   By: Virgina Norfolk M.D.   On: 05/19/2022 20:58   DG Chest  Port 1 View  Result Date: 05/19/2022 CLINICAL DATA:  Questionable sepsis. EXAM: PORTABLE CHEST 1 VIEW COMPARISON:  03/03/2022 FINDINGS: 1824 hours. Low volume film. The cardio  pericardial silhouette is enlarged. There is pulmonary vascular congestion without overt pulmonary edema. No focal airspace consolidation or overt pulmonary edema. No substantial pleural effusion. The visualized bony structures of the thorax are unremarkable. Telemetry leads overlie the chest. IMPRESSION: Low volume film with vascular congestion. Electronically Signed   By: Kennith Center M.D.   On: 05/19/2022 18:41    Procedures Procedures    Medications Ordered in ED Medications  EPINEPHrine (ADRENALIN) 5 mg in NS 250 mL (0.02 mg/mL) premix infusion (13 mcg/min Intravenous Infusion Verify 05/19/22 2128)  lactated ringers infusion ( Intravenous New Bag/Given 05/19/22 2101)  lactated ringers bolus 1,000 mL (1,000 mLs Intravenous Not Given 05/19/22 2026)  cefTRIAXone (ROCEPHIN) 2 g in sodium chloride 0.9 % 100 mL IVPB (0 g Intravenous Stopped 05/19/22 2050)  metroNIDAZOLE (FLAGYL) IVPB 500 mg (500 mg Intravenous New Bag/Given 05/19/22 2128)  dexamethasone (DECADRON) injection 10 mg (has no administration in time range)  remdesivir 100 mg in sodium chloride 0.9 % 100 mL IVPB (has no administration in time range)    Followed by  remdesivir 100 mg in sodium chloride 0.9 % 100 mL IVPB (has no administration in time range)    Followed by  remdesivir 100 mg in sodium chloride 0.9 % 100 mL IVPB (has no administration in time range)  lactated ringers bolus 1,000 mL (0 mLs Intravenous Stopped 05/19/22 2050)  atropine injection 1 mg (1 mg Intravenous Given 05/19/22 1845)    ED Course/ Medical Decision Making/ A&P                             Medical Decision Making Amount and/or Complexity of Data Reviewed Labs: ordered. Radiology: ordered. ECG/medicine tests: ordered.  Risk Prescription drug management.   This patient  presents to the ED for concern of syncope, this involves an extensive number of treatment options, and is a complaint that carries with it a high risk of complications and morbidity.  The differential diagnosis includes dehydration, arrhythmia, polypharmacy, infection, CAD, metabolic derangements   Co morbidities that complicate the patient evaluation  HTN, HLD, T2DM, neuropathy, BPH, GERD, anemia, anxiety   Additional history obtained:  Additional history obtained from EMS, patient's son External records from outside source obtained and reviewed including EMR   Lab Tests:  I Ordered, and personally interpreted labs.  The pertinent results include: Leukocytosis and lactic acidosis consistent with sepsis and/or ischemia.  AKI is present.  Glucose is mildly elevated without evidence of DKA.  Electrolytes are grossly normal.  Anemia is baseline.  Troponin is normal.  Confirmed COVID-19 positive.   Imaging Studies ordered:  I ordered imaging studies including chest x-ray, CT of head, chest, abdomen, pelvis I independently visualized and interpreted imaging which showed wall thickening of ascending colon; bilateral ethmoid sinus disease I agree with the radiologist interpretation   Cardiac Monitoring: / EKG:  The patient was maintained on a cardiac monitor.  I personally viewed and interpreted the cardiac monitored which showed an underlying rhythm of: Sinus rhythm   Consultations Obtained:  I requested consultation with the general surgeon, Dr. Robyne Peers,  and discussed lab and imaging findings as well as pertinent plan - they recommend: Bowel ischemia less likely in the setting of no blood in stool.  She will follow in consultation   Problem List / ED Course / Critical interventions / Medication management  Patient presents for fatigue, generalized weakness, and syncope.  EMS reports hypotension on scene of 77/31  with a witnessed syncopal episode when standing.  On arrival in the  ED, patient mains hypotensive with blood pressures of 60s over 40s.  Despite this, he is alert and oriented.  IV fluids and epinephrine gtt. were initiated.  Despite his hypotension, patient is currently bradycardic with heart rate in the low 50s.  Initial EKG shows indeterminate rhythm.  Repeat EKG shows sinus rhythm.  Per chart review, he is on blood pressure medications with AV nodal activity.  Son confirms that he did get these medications this morning.  Patient was given 1 mg of atropine with improvement in heart rate to the range of 70.  Heart rate remained in the 60s.  Blood pressure improved and patient was kept on 30 mcg/min of epinephrine.  On lab work, patient does have a mild leukocytosis.  Initial lactic acid was 2.0.  Patient was treated empirically for sepsis with additional IV fluids and antibiotics.  He does have a history of BPH and UTIs.  Currently, awaiting urine sample.  Patient underwent CT imaging.  On CT imaging, he did have an area of colonic wall thickening of ascending colon extending to hepatic flexure.  This raises concern for colitis and/or bowel ischemia.  Of note, patient arrived with soft abdomen and no abdominal complaints.  He did have diarrhea after arrival but was Hemoccult negative.  What is concerning is that his second lactic acid increased to 3.2.  I discussed this with general surgeon on-call, Dr. Okey Dupre, who states that bowel ischemia is less likely in the setting of absence of blood in stool.  She will follow in consult.  Patient's son confirms full code.  Patient to be admitted to medicine for further management. I ordered medication including IV fluids for hydration; epinephrine for hypotension; antibiotics for empiric treatment of sepsis; atropine for bradycardia Reevaluation of the patient after these medicines showed that the patient improved I have reviewed the patients home medicines and have made adjustments as needed   Social Determinants of  Health:  Lives at home with family  CRITICAL CARE Performed by: Godfrey Pick   Total critical care time: 45 minutes  Critical care time was exclusive of separately billable procedures and treating other patients.  Critical care was necessary to treat or prevent imminent or life-threatening deterioration.  Critical care was time spent personally by me on the following activities: development of treatment plan with patient and/or surrogate as well as nursing, discussions with consultants, evaluation of patient's response to treatment, examination of patient, obtaining history from patient or surrogate, ordering and performing treatments and interventions, ordering and review of laboratory studies, ordering and review of radiographic studies, pulse oximetry and re-evaluation of patient's condition.         Final Clinical Impression(s) / ED Diagnoses Final diagnoses:  Shock ([place])  COVID-19  Diarrhea, unspecified type    Rx / DC Orders ED Discharge Orders     None         Godfrey Pick, MD 05/19/22 2144

## 2022-05-19 NOTE — H&P (Signed)
TRH H&P   Patient Demographics:    Tim Henry, is a 89 y.o. male  MRN: [id number]   DOB - 10-07-1927  Admit Date - 05/19/2022  Outpatient Primary MD for the patient is Tim Boss, MD  Referring MD/NP/PA: Dr Durwin Nora  Patient coming from: home  Chief Complaint  Patient presents with   Loss of Consciousness      HPI:    Tim Henry  is a 89 y.o. male, with medical history significant of hypertension, hyperlipidemia T2DM, GERD, BPH . -Patient was brought home via EMS, it was tested positive for COVID-19 yesterday, he had some nausea, vomiting and diarrhea today, EMS were called due to lethargy, when they arrived at the scene he was hypotensive, he had a witnessed syncopal episode when standing, at baseline he ambulates with a walker, had syncope at home, had some diarrhea, he denies any chest pain. -In ED patient was hypotensive requiring pressor support, chest x-ray significant for vascular congestion, CT abdomen pelvis significant for colitis, labs significant with worsening creatinine at 2.3, elevated lactic acid at 3.2, blood cultures were sent, UA is pending, [facility] hospitalist consulted to admit.    Review of systems:    A full 10 point Review of Systems was done, except as stated above, all other Review of Systems were negative.   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)    none since release from hosp      Past Surgical History:  Procedure  Laterality Date   APPENDECTOMY     childhood   BACK SURGERY  1963   CATARACT EXTRACTION W/PHACO Left 09/15/2014   Procedure: CATARACT EXTRACTION PHACO AND INTRAOCULAR LENS PLACEMENT (IOC);  Surgeon: Lockie Mola, MD;  Location: [facility];  Service: Ophthalmology;  Laterality: Left;  DIABETIC PT WOULD LIKE TO HAVE ARRIVAL TIME LATE AM   JOINT REPLACEMENT Left    knee   PHOTOCOAGULATION WITH LASER Right 01/23/2016   Procedure: PHOTOCOAGULATION WITH LASER;  Surgeon: Sherald Hess, MD;  Location: [facility];  Service: Ophthalmology;  Laterality: Right;  DIABETIC - oral meds RIGHT Requests arrival 10AM or after      Social History:     Social History   Tobacco Use   Smoking status: Former    Types: Cigarettes    Quit date: 04/30/1966  Years since quitting: 56.0   Smokeless tobacco: Never  Substance Use Topics   Alcohol use: No      Family History :     Family History  Problem Relation Age of Onset   Heart attack Mother    Diabetes Mother    Heart attack Father    Diabetes Sister       Home Medications:   Prior to Admission medications   Medication Sig Start Date End Date Taking? Authorizing Provider  albuterol (VENTOLIN HFA) 108 (90 Base) MCG/ACT inhaler Inhale 2 puffs into the lungs every 6 (six) hours as needed for wheezing or shortness of breath. 03/06/22   Shon HaleEmokpae, Courage, MD  ALPRAZolam Prudy Feeler(XANAX) 0.5 MG tablet Take 0.5 mg by mouth 2 (two) times daily.    [provider]  aspirin EC 81 MG tablet Take 1 tablet (81 mg total) by mouth daily with breakfast. Swallow whole. 03/06/22   Shon HaleEmokpae, Courage, MD  carvedilol (COREG) 6.25 MG tablet Take 6.25 mg by mouth 2 (two) times daily with a meal.  11/20/17   [provider]  dextromethorphan-guaiFENesin (MUCINEX DM) 30-600 MG 12hr tablet Take 1 tablet by mouth 2 (two) times daily. 03/06/22   Shon HaleEmokpae, Courage, MD  diphenhydrAMINE (BENADRYL) 25 MG tablet Take 25 mg by mouth every  6 (six) hours as needed for allergies.    [provider]  dorzolamide-timolol (COSOPT) 22.3-6.8 MG/ML ophthalmic solution Place 1 drop into both eyes 2 (two) times daily.    [provider]  ferrous sulfate 325 (65 FE) MG tablet Take 325 mg by mouth daily with breakfast.    [provider]  finasteride (PROSCAR) 5 MG tablet Take 1 tablet (5 mg total) by mouth daily. 04/05/22   Stoioff, Verna CzechScott C, MD  furosemide (LASIX) 20 MG tablet Take 1 tablet (20 mg total) by mouth daily. 03/06/22   Shon HaleEmokpae, Courage, MD  gabapentin (NEURONTIN) 300 MG capsule Take 300 mg by mouth 2 (two) times daily. 1 PM, 11 PM    [provider]  Glucosamine 500 MG CAPS Take 3 capsules by mouth every evening.    [provider]  GLUCOSAMINE HCL PO Take by mouth.    [provider]  latanoprost (XALATAN) 0.005 % ophthalmic solution Place 1 drop into both eyes at bedtime.    [provider]  metFORMIN (GLUCOPHAGE) 500 MG tablet Take 1 tablet (500 mg total) by mouth 2 (two) times daily with a meal. 1 PM, 11 PM 03/06/22   Emokpae, Courage, MD  Multiple Vitamin (MULTIVITAMIN) capsule Take 1 capsule by mouth daily.    [provider]  OIL OF OREGANO PO Take by mouth.    [provider]  omeprazole (PRILOSEC) 40 MG capsule Take 40 mg by mouth daily. 02/07/18   [provider]  PHOSPHATIDYL CHOLINE PO Take 2 tablets by mouth daily.    [provider]  potassium chloride (K-DUR) 10 MEQ tablet Take 20 mEq by mouth daily.    [provider]  Probiotic Product (PROBIOTIC DAILY PO) Take by mouth.    [provider]  sertraline (ZOLOFT) 100 MG tablet Take 100 mg by mouth daily.    [provider]  simvastatin (ZOCOR) 10 MG tablet Take 10 mg by mouth daily. 10 AM    [provider]  sitaGLIPtin (JANUVIA) 100 MG tablet Take 100 mg by mouth daily.    [provider]  tamsulosin (FLOMAX) 0.4 MG CAPS capsule  Take 1 capsule (0.4  mg total) by mouth daily after breakfast. 1 PM 04/05/22   Stoioff, Ronda Fairly, MD  telmisartan (MICARDIS) 40 MG tablet Take 20 mg by mouth daily. 11/20/17   [provider]  verapamil (COVERA HS) 240 MG (CO) 24 hr tablet Take 240 mg by mouth daily.    [provider]  Vitamin D, Ergocalciferol, (DRISDOL) 1.25 MG (50000 UT) CAPS capsule Take 50,000 Units by mouth every 7 (seven) days. Wednesday 02/07/18   [provider]     Allergies:    No Known Allergies   Physical Exam:   Vitals  Blood pressure 111/60, pulse 63, temperature 98.2 F (36.8 C), temperature source Rectal, resp. rate 19, height 5\' 11"  (1.803 m), weight 103.9 kg, SpO2 100 %.   1. General elderly male, laying in bed, no apparent distress  2. Normal affect and insight, Not Suicidal or Homicidal, Awake Alert, Oriented X 3.  3. No F.N deficits, ALL C.Nerves Intact, Strength 5/5 all 4 extremities, Sensation intact all 4 extremities, Plantars down going.  4. Ears and Eyes appear Normal, Conjunctivae clear, PERRLA. Moist Oral Mucosa.  5. Supple Neck, No JVD, No cervical lymphadenopathy appriciated, No Carotid Bruits.  6. Symmetrical Chest wall movement, scattered Rales and rhonchi  7. RRR, No Gallops, Rubs or Murmurs, No Parasternal Heave.  8. Positive Bowel Sounds, Abdomen Soft, No tenderness, No organomegaly appriciated,No rebound -guarding or rigidity.  9.  No Cyanosis, Normal Skin Turgor, No Skin Rash or Bruise.  10. Good muscle tone,  joints appear normal , no effusions, Normal ROM.    Data Review:    CBC Recent Labs  Lab 05/19/22 1833 05/19/22 1835  WBC 11.1*  --   HGB 10.1* 10.9*  HCT 31.6* 32.0*  PLT 129*  --   MCV 103.6*  --   MCH 33.1  --   MCHC 32.0  --   RDW 13.2  --   LYMPHSABS 1.8  --   MONOABS 1.1*  --   EOSABS 0.2  --   BASOSABS 0.0  --     ------------------------------------------------------------------------------------------------------------------  Chemistries  Recent Labs  Lab 05/19/22 1833 05/19/22 1835  NA 133* 137  K 4.2 4.4  CL 100 101  CO2 25  --   GLUCOSE 210* 203*  BUN 33* 29*  CREATININE 2.26* 2.40*  CALCIUM 8.1*  --   MG 1.7  --   AST 28  --   ALT 16  --   ALKPHOS 49  --   BILITOT 1.0  --    ------------------------------------------------------------------------------------------------------------------ estimated creatinine clearance is 24.1 mL/min (A) (by C-G formula based on SCr of 2.4 mg/dL (H)). ------------------------------------------------------------------------------------------------------------------ No results for input(s): "TSH", "T4TOTAL", "T3FREE", "THYROIDAB" in the last 72 hours.  Invalid input(s): "FREET3"  Coagulation profile Recent Labs  Lab 05/19/22 1833  INR 1.2   ------------------------------------------------------------------------------------------------------------------- No results for input(s): "DDIMER" in the last 72 hours. -------------------------------------------------------------------------------------------------------------------  Cardiac Enzymes No results for input(s): "CKMB", "TROPONINI", "MYOGLOBIN" in the last 168 hours.  Invalid input(s): "CK" ------------------------------------------------------------------------------------------------------------------ No results found for: "BNP"   ---------------------------------------------------------------------------------------------------------------  Urinalysis    Component Value Date/Time   COLORURINE AMBER (A) 03/03/2022 1953   APPEARANCEUR Clear 04/05/2022 1101   LABSPEC 1.013 03/03/2022 1953   PHURINE 5.0 03/03/2022 1953   GLUCOSEU Negative 04/05/2022 1101   HGBUR MODERATE (A) 03/03/2022 1953   BILIRUBINUR Negative 04/05/2022 1101   KETONESUR NEGATIVE 03/03/2022 1953   PROTEINUR  Negative 04/05/2022 1101   PROTEINUR 100 (A) 03/03/2022 1953   NITRITE  Negative 04/05/2022 1101   NITRITE NEGATIVE 03/03/2022 1953   LEUKOCYTESUR Negative 04/05/2022 1101   LEUKOCYTESUR LARGE (A) 03/03/2022 1953    ----------------------------------------------------------------------------------------------------------------   Imaging Results:    CT CHEST ABDOMEN PELVIS WO CONTRAST  Result Date: 05/19/2022 CLINICAL DATA:  Sepsis, tested positive for COVID-19 yesterday, nausea, vomiting, diarrhea, hypoxemia EXAM: CT CHEST, ABDOMEN AND PELVIS WITHOUT CONTRAST TECHNIQUE: Multidetector CT imaging of the chest, abdomen and pelvis was performed following the standard protocol without IV contrast. RADIATION DOSE REDUCTION: This exam was performed according to the departmental dose-optimization program which includes automated exposure control, adjustment of the mA and/or kV according to patient size and/or use of iterative reconstruction technique. COMPARISON:  03/03/2022 CT abdomen and pelvis FINDINGS: CT CHEST FINDINGS Cardiovascular: Atherosclerotic calcifications aorta, proximal great vessels, and coronary arteries. Aneurysmal dilatation ascending thoracic aorta 4.1 cm transverse image 25. Heart size normal. No pericardial effusion. Mediastinum/Nodes: Question small hiatal hernia. Esophagus normal. Base of cervical region normal appearance. No thoracic adenopathy. Lungs/Pleura: Peribronchial thickening. Dependent atelectasis BILATERAL lower lobes, less in upper lobes. No definite infiltrate, pleural effusion, or pneumothorax. Musculoskeletal: No acute osseous findings. CT ABDOMEN PELVIS FINDINGS Hepatobiliary: 2.3 cm diameter gallstone within gallbladder. Gallbladder and liver otherwise normal appearance. Pancreas: Normal appearance Spleen: Normal appearance Adrenals/Urinary Tract: Adrenal glands normal appearance. BILATERAL renal cysts unchanged, largest 2.2 cm diameter anterior LEFT kidney image 54; no  follow-up imaging recommended. No hydronephrosis, hydroureter or urinary tract calcification. Bladder unremarkable Stomach/Bowel: Diffuse colonic diverticulosis without evidence of diverticulitis. Wall thickening of the ascending colon through hepatic flexure of colon question colitis. Stomach unremarkable. Mild fecalization of small bowel contents distally though no point of obstruction, bowel wall thickening, or small bowel dilatation is identified. Vascular/Lymphatic: Tortuous abdominal aorta with scattered atherosclerotic calcifications. Aorta normal caliber. Additional atherosclerotic calcifications within iliac arteries. No adenopathy. Reproductive: Minimal prostatic enlargement for age Other: No free air or free fluid. RIGHT inguinal and small umbilical hernias containing fat. Musculoskeletal: Degenerative changes lumbar spine and LEFT hip joint IMPRESSION: Bibasilar atelectasis without definite infiltrate. Diffuse colonic diverticulosis without evidence of diverticulitis. Wall thickening of the ascending colon through hepatic flexure of colon question colitis; this could be due to infection, inflammatory bowel disease, or less likely ischemia. Cholelithiasis. RIGHT inguinal and small umbilical hernias containing fat. Aneurysmal dilatation of ascending thoracic aorta 4.1 cm transverse, recommendation below. Recommend annual imaging followup by CTA or MRA. This recommendation follows 2010 ACCF/AHA/AATS/ACR/ASA/SCA/SCAI/SIR/STS/SVM Guidelines for the Diagnosis and Management of Patients with Thoracic Aortic Disease. Circulation. 2010; 121: C376-E831. Aortic aneurysm NOS (ICD10-I71.9) Aortic Atherosclerosis (ICD10-I70.0). Aortic aneurysm NOS (ICD10-I71.9). Electronically Signed   By: Ulyses Southward M.D.   On: 05/19/2022 21:12   CT Head Wo Contrast  Result Date: 05/19/2022 CLINICAL DATA:  Syncope EXAM: CT HEAD WITHOUT CONTRAST TECHNIQUE: Contiguous axial images were obtained from the base of the skull through  the vertex without intravenous contrast. RADIATION DOSE REDUCTION: This exam was performed according to the departmental dose-optimization program which includes automated exposure control, adjustment of the mA and/or kV according to patient size and/or use of iterative reconstruction technique. COMPARISON:  March 03, 2022 FINDINGS: Brain: There is mild cerebral atrophy with widening of the extra-axial spaces and ventricular dilatation. There are areas of decreased attenuation within the white matter tracts of the supratentorial brain, consistent with microvascular disease changes. Vascular: No hyperdense vessel or unexpected calcification. Skull: Normal. Negative for fracture or focal lesion. Sinuses/Orbits: There is moderate to marked severity bilateral ethmoid sinus mucosal thickening. Other: None. IMPRESSION: 1.  No acute intracranial abnormality. 2. Generalized cerebral atrophy with widening of the extra-axial spaces and ventricular dilatation. 3. Moderate to marked severity bilateral ethmoid sinus disease. Electronically Signed   By: Aram Candela M.D.   On: 05/19/2022 20:58   DG Chest Port 1 View  Result Date: 05/19/2022 CLINICAL DATA:  Questionable sepsis. EXAM: PORTABLE CHEST 1 VIEW COMPARISON:  03/03/2022 FINDINGS: 1824 hours. Low volume film. The cardio pericardial silhouette is enlarged. There is pulmonary vascular congestion without overt pulmonary edema. No focal airspace consolidation or overt pulmonary edema. No substantial pleural effusion. The visualized bony structures of the thorax are unremarkable. Telemetry leads overlie the chest. IMPRESSION: Low volume film with vascular congestion. Electronically Signed   By: Kennith Center M.D.   On: 05/19/2022 18:41    EKG:  Vent. rate 53 BPM PR interval 164 ms QRS duration 106 ms QT/QTcB 458/430 ms P-R-T axes 0 15 44 Sinus rhythm  Assessment & Plan:    Principal Problem:   Septic shock (HCC) Active Problems:   Type 2 diabetes  mellitus with complication (HCC)   GERD (gastroesophageal reflux disease)   Diabetes (HCC)   AKI (acute kidney injury) (HCC)   COVID-19 virus infection    Septic shock -septic shock present on admission, elevated lactic acid, hypotensive requiring pressor support -So far workup significant for colitis on imaging, no infection on chest x-ray, he is pending -Continue with broad-spectrum antibiotic vancomycin, cefepime and Flagyl, down escalate when more culture data available -Will keep on stress dose steroids. -Evidence of volume overload, cannot push anymore with IV fluids  Covid 19 infection -Evidence of pneumonia on imaging, just vascular congestion. -Will start on IV remdesivir. -Is on 3 L oxygen, saturating 100%, unclear if he has new oxygen requirement or not, but will continue with IV steroids in the setting of septic shock.  AKI -due to shock, hypotension, hold nephrotoxic medications and continue with IV fluids  Anemia of chronic kidney disease - at baseline, no indication for transfusion  Diabetes mellitus type 2 -Keep an insulin sliding scale and hold oral regimen   Hypertension  -Continue to hold verapamil, Coreg and Avapro to above  Bradycardia- received atropine, now heart rate acceptable on pressors -Likely worsened by verapamil and Coreg  anxiety and depression -resume on sertraline with more stable and on Xanax and blood pressure is stable  BPH -Continue with Proscar and Flomax when blood pressure has improved  GERD -Continue with PPI   DVT Prophylaxis Heparin   AM Labs Ordered, also please review Full Orders  Family Communication: Admission, patients condition and plan of care including tests being ordered have been discussed with the patient and son at bedside who indicate understanding and agree with the plan and Code Status.  Code Status Full  Likely DC to  home  Condition GUARDED    Consults called: none    Admission status: inpatient     Time spent in minutes : 70 minutes critical care  The patient is critically ill with multi-organ failure.  Critical care was necessary to treat or prevent imminent or life-threatening deterioration of septic shock , respiratory failure,  acute renal failure and was exclusive of separately billable procedures and treating other patients. Total critical care time spent by me: 70 minutes Time spent personally by me on obtaining history from patient or surrogate, evaluation of the patient, evaluation of patient's response to treatment, ordering and review of laboratory studies, ordering and review of radiographic studies, ordering and performing treatments and  interventions, and re-evaluation of the patient's condition.   Phillips Climes M.D on 05/19/2022 at 10:02 PM   [facility] - Office  [phone]

## 2022-05-19 NOTE — ED Notes (Signed)
Date and time results received: 05/19/22 @1852    Test: PO2 Critical Value: less than 31  Name of Provider Notified: Dr Doren Custard  Orders Received? Or Actions Taken?: Orders Received - See Orders for details

## 2022-05-20 ENCOUNTER — Encounter (HOSPITAL_COMMUNITY): Payer: Self-pay | Admitting: Internal Medicine

## 2022-05-20 DIAGNOSIS — R6521 Severe sepsis with septic shock: Secondary | ICD-10-CM | POA: Diagnosis not present

## 2022-05-20 DIAGNOSIS — A419 Sepsis, unspecified organism: Secondary | ICD-10-CM | POA: Diagnosis not present

## 2022-05-20 LAB — LACTIC ACID, PLASMA
Lactic Acid, Venous: 1.1 mmol/L (ref 0.5–1.9)
Lactic Acid, Venous: 1.4 mmol/L (ref 0.5–1.9)

## 2022-05-20 LAB — BASIC METABOLIC PANEL
Anion gap: 9 (ref 5–15)
BUN: 32 mg/dL — ABNORMAL HIGH (ref 8–23)
CO2: 21 mmol/L — ABNORMAL LOW (ref 22–32)
Calcium: 8.1 mg/dL — ABNORMAL LOW (ref 8.9–10.3)
Chloride: 105 mmol/L (ref 98–111)
Creatinine, Ser: 1.81 mg/dL — ABNORMAL HIGH (ref 0.61–1.24)
GFR, Estimated: 35 mL/min — ABNORMAL LOW (ref >60)
Glucose, Bld: 213 mg/dL — ABNORMAL HIGH (ref 70–99)
Potassium: 4.2 mmol/L (ref 3.5–5.1)
Sodium: 135 mmol/L (ref 135–145)

## 2022-05-20 LAB — GLUCOSE, CAPILLARY
Glucose-Capillary: 169 mg/dL — ABNORMAL HIGH (ref 70–99)
Glucose-Capillary: 206 mg/dL — ABNORMAL HIGH (ref 70–99)
Glucose-Capillary: 214 mg/dL — ABNORMAL HIGH (ref 70–99)
Glucose-Capillary: 259 mg/dL — ABNORMAL HIGH (ref 70–99)

## 2022-05-20 LAB — URINALYSIS, ROUTINE W REFLEX MICROSCOPIC
Bilirubin Urine: NEGATIVE
Glucose, UA: NEGATIVE mg/dL
Ketones, ur: 5 mg/dL — AB
Leukocytes,Ua: NEGATIVE
Nitrite: NEGATIVE
Protein, ur: 30 mg/dL — AB
Specific Gravity, Urine: 1.016 (ref 1.005–1.030)
pH: 5 (ref 5.0–8.0)

## 2022-05-20 LAB — CBC
HCT: 34.9 % — ABNORMAL LOW (ref 39.0–52.0)
Hemoglobin: 10.7 g/dL — ABNORMAL LOW (ref 13.0–17.0)
MCH: 32 pg (ref 26.0–34.0)
MCHC: 30.7 g/dL (ref 30.0–36.0)
MCV: 104.5 fL — ABNORMAL HIGH (ref 80.0–100.0)
Platelets: 136 10*3/uL — ABNORMAL LOW (ref 150–400)
RBC: 3.34 MIL/uL — ABNORMAL LOW (ref 4.22–5.81)
RDW: 12.8 % (ref 11.5–15.5)
WBC: 8.5 10*3/uL (ref 4.0–10.5)
nRBC: 0 % (ref 0.0–0.2)

## 2022-05-20 LAB — BRAIN NATRIURETIC PEPTIDE: B Natriuretic Peptide: 108 pg/mL — ABNORMAL HIGH (ref 0.0–100.0)

## 2022-05-20 LAB — MRSA NEXT GEN BY PCR, NASAL: MRSA by PCR Next Gen: NOT DETECTED

## 2022-05-20 MED ORDER — GUAIFENESIN 100 MG/5ML PO LIQD
5.0000 mL | ORAL | Status: DC | PRN
  Administered 2022-05-20: 5 mL via ORAL
  Filled 2022-05-20: qty 5

## 2022-05-20 MED ORDER — IPRATROPIUM-ALBUTEROL 0.5-2.5 (3) MG/3ML IN SOLN
3.0000 mL | Freq: Four times a day (QID) | RESPIRATORY_TRACT | Status: DC
  Administered 2022-05-20 (×2): 3 mL via RESPIRATORY_TRACT
  Filled 2022-05-20 (×2): qty 3

## 2022-05-20 MED ORDER — VANCOMYCIN VARIABLE DOSE PER UNSTABLE RENAL FUNCTION (PHARMACIST DOSING): Status: DC

## 2022-05-20 MED ORDER — METHYLPREDNISOLONE SODIUM SUCC 40 MG IJ SOLR
40.0000 mg | Freq: Two times a day (BID) | INTRAMUSCULAR | Status: DC
  Administered 2022-05-20: 40 mg via INTRAVENOUS
  Filled 2022-05-20: qty 1

## 2022-05-20 MED ORDER — IPRATROPIUM-ALBUTEROL 0.5-2.5 (3) MG/3ML IN SOLN
3.0000 mL | Freq: Three times a day (TID) | RESPIRATORY_TRACT | Status: DC
  Administered 2022-05-21: 3 mL via RESPIRATORY_TRACT
  Filled 2022-05-20: qty 3

## 2022-05-20 NOTE — Progress Notes (Addendum)
PROGRESS NOTE  Tim Henry CWC:[id number] DOB: [birth year] DOA: 05/19/2022 PCP: Josem Kaufmann, MD  HPI/Recap of past 24 hours: Tim Henry  is a 89 y.o. male, with medical history significant of hypertension, hyperlipidemia T2DM, GERD, BPH. -Patient was brought from home via EMS, he tested positive for COVID-19 the day prior.  Associated with nausea, vomiting and diarrhea.  EMS was called due to lethargy.  When they arrived, he was hypotensive, and had a witnessed syncopal episode.  At baseline he ambulates with a walker. -In ED patient was hypotensive requiring vasopressor support.  Chest x-ray significant for vascular congestion, CT abdomen pelvis significant for colitis, labs significant with worsening creatinine at 2.3, elevated lactic acid at 3.2, blood cultures were sent, UA is pending, [facility] hospitalist consulted to admit.  05/20/22: The patient was seen and examined at his bedside. His breathing is improved.  Denies having any abdominal pain at the time of this visit.  Endorses loose stools.  Assessment/Plan: Principal Problem:   Septic shock (HCC) Active Problems:   Type 2 diabetes mellitus with complication (HCC)   GERD (gastroesophageal reflux disease)   Diabetes (HCC)   AKI (acute kidney injury) ([place])   COVID-19 virus infection  Septic shock, resolved -septic shock present on admission, elevated lactic acid, hypotensive requiring vasopressor support -So far workup significant for colitis on imaging, no infection on chest x-ray -Continue with broad-spectrum antibiotic cefepime and Flagyl, down escalate when more culture data available -Will keep on stress dose steroids, de-escalate IV steroids.   Covid 19 viral infection Nausea and vomiting diarrhea likely secondary from COVID-19 viral infection Continue IV remdesivir. -Is on 2 L oxygen, saturating 100%, unclear if he has new oxygen requirement or not, but will continue with IV steroids in the setting of septic  shock. DuoNebs every 6 hours As needed antitussives Incentive spirometer Mobilize as tolerated.   AKI, likely prerenal in the setting of dehydration from nausea vomiting and diarrhea Presented with creatinine of 2.40 Repeat creatinine 1.81 with GFR 35. -due to shock, hypotension, hold nephrotoxic medications and continue with IV fluids LR 50 cc/h. Monitor urine output Avoid nephrotoxic agents, dehydration and hypotension.   Anemia of chronic kidney disease - at baseline, no indication for transfusion No overt bleeding reported.   Diabetes mellitus type 2 -Keep an insulin sliding scale and hold oral regimen    Hypertension, initially hypotensive in the setting of septic shock. -Continue to hold verapamil, Coreg and Avapro to above Maintain MAP greater than 65 Closely monitor vital signs.   Resolved bradycardia- received atropine, now heart rate acceptable on pressors -Likely worsened by verapamil and Coreg   Chronic anxiety and depression -resume on sertraline with more stable and on Xanax and blood pressure is stable   BPH -Continue with Proscar and Flomax when blood pressure has improved Monitor urine output with   GERD -Continue with PPI  Physical debility PT OT assessment Fall precautions.     DVT Prophylaxis Subcu heparin 3 times daily   AM Labs Ordered, also please review Full Orders   Family Communication: Admission, patients condition and plan of care including tests being ordered have been discussed with the patient and son at bedside who indicate understanding and agree with the plan and Code Status.   Code Status Full   Likely DC to  home   Condition GUARDED     Consults called: none     Admission status: inpatient     Critical care time: 65 minutes.  Objective: Vitals:   05/20/22 0700 05/20/22 0728 05/20/22 0810 05/20/22 1128  BP: 139/62 133/64    Pulse: (!) 52 61    Resp: 17 19    Temp:  98.3 F (36.8 C)  (!) 97.5 F (36.4 C)   TempSrc:  Axillary  Oral  SpO2: 99% 97% 100%   Weight:      Height:        Intake/Output Summary (Last 24 hours) at 05/20/2022 1342 Last data filed at 05/20/2022 1127 Gross per 24 hour  Intake 3266.3 ml  Output 700 ml  Net 2566.3 ml   Filed Weights   05/19/22 1802 05/19/22 2345  Weight: 103.9 kg 102 kg    Exam:  General: 89 y.o. year-old male well developed well nourished in no acute distress.  Alert and oriented x3. Cardiovascular: Regular rate and rhythm with no rubs or gallops.  No thyromegaly or JVD noted.   Respiratory: Mild rales at bases.  No wheezing noted.  Poor inspiratory effort.   Abdomen: Soft nontender nondistended with normal bowel sounds x4 quadrants. Musculoskeletal: No lower extremity edema. 2/4 pulses in all 4 extremities. Skin: No ulcerative lesions noted or rashes, Psychiatry: Mood is appropriate for condition and setting   Data Reviewed: CBC: Recent Labs  Lab 05/19/22 1833 05/19/22 1835 05/20/22 0411  WBC 11.1*  --  8.5  NEUTROABS 7.9*  --   --   HGB 10.1* 10.9* 10.7*  HCT 31.6* 32.0* 34.9*  MCV 103.6*  --  104.5*  PLT 129*  --  [id number]   Basic Metabolic Panel: Recent Labs  Lab 05/19/22 1833 05/19/22 1835 05/20/22 0411  NA 133* 137 135  K 4.2 4.4 4.2  CL 100 101 105  CO2 25  --  21*  GLUCOSE 210* 203* 213*  BUN 33* 29* 32*  CREATININE 2.26* 2.40* 1.81*  CALCIUM 8.1*  --  8.1*  MG 1.7  --   --    GFR: Estimated Creatinine Clearance: 31.7 mL/min (A) (by C-G formula based on SCr of 1.81 mg/dL (H)). Liver Function Tests: Recent Labs  Lab 05/19/22 1833  AST 28  ALT 16  ALKPHOS 49  BILITOT 1.0  PROT 6.0*  ALBUMIN 3.0*   No results for input(s): "LIPASE", "AMYLASE" in the last 168 hours. No results for input(s): "AMMONIA" in the last 168 hours. Coagulation Profile: Recent Labs  Lab 05/19/22 1833 05/19/22 2240  INR 1.2 1.2   Cardiac Enzymes: No results for input(s): "CKTOTAL", "CKMB", "CKMBINDEX", "TROPONINI" in the last 168  hours. BNP (last 3 results) No results for input(s): "PROBNP" in the last 8760 hours. HbA1C: No results for input(s): "HGBA1C" in the last 72 hours. CBG: Recent Labs  Lab 05/19/22 1814 05/20/22 0733 05/20/22 1121  GLUCAP 161* 169* 206*   Lipid Profile: No results for input(s): "CHOL", "HDL", "LDLCALC", "TRIG", "CHOLHDL", "LDLDIRECT" in the last 72 hours. Thyroid Function Tests: No results for input(s): "TSH", "T4TOTAL", "FREET4", "T3FREE", "THYROIDAB" in the last 72 hours. Anemia Panel: No results for input(s): "VITAMINB12", "FOLATE", "FERRITIN", "TIBC", "IRON", "RETICCTPCT" in the last 72 hours. Urine analysis:    Component Value Date/Time   COLORURINE YELLOW 05/19/2022 2300   APPEARANCEUR HAZY (A) 05/19/2022 2300   APPEARANCEUR Clear 04/05/2022 1101   LABSPEC 1.016 05/19/2022 2300   PHURINE 5.0 05/19/2022 2300   GLUCOSEU NEGATIVE 05/19/2022 2300   HGBUR SMALL (A) 05/19/2022 2300   BILIRUBINUR NEGATIVE 05/19/2022 2300   BILIRUBINUR Negative 04/05/2022 1101   KETONESUR 5 (A) 05/19/2022 2300  PROTEINUR 30 (A) 05/19/2022 2300   NITRITE NEGATIVE 05/19/2022 2300   LEUKOCYTESUR NEGATIVE 05/19/2022 2300   Sepsis Labs: @LABRCNTIP (procalcitonin:4,lacticidven:4)  ) Recent Results (from the past 240 hour(s))  Resp panel by RT-PCR (RSV, Flu A&B, Covid) Anterior Nasal Swab     Status: Abnormal   Collection Time: 05/19/22  6:27 PM   Specimen: Anterior Nasal Swab  Result Value Ref Range Status   SARS Coronavirus 2 by RT PCR POSITIVE (A) NEGATIVE Final    Comment: (NOTE) SARS-CoV-2 target nucleic acids are DETECTED.  The SARS-CoV-2 RNA is generally detectable in upper respiratory specimens during the acute phase of infection. Positive results are indicative of the presence of the identified virus, but do not rule out bacterial infection or co-infection with other pathogens not detected by the test. Clinical correlation with patient history and other diagnostic information is  necessary to determine patient infection status. The expected result is Negative.  Fact Sheet for Patients: EntrepreneurPulse.com.au  Fact Sheet for Healthcare Providers: IncredibleEmployment.be  This test is not yet approved or cleared by the [place] FDA and  has been authorized for detection and/or diagnosis of SARS-CoV-2 by FDA under an Emergency Use Authorization (EUA).  This EUA will remain in effect (meaning this test can be used) for the duration of  the COVID-19 declaration under Section 564(b)(1) of the A ct, 21 U.S.C. section 360bbb-3(b)(1), unless the authorization is terminated or revoked sooner.     Influenza A by PCR NEGATIVE NEGATIVE Final   Influenza B by PCR NEGATIVE NEGATIVE Final    Comment: (NOTE) The Xpert Xpress SARS-CoV-2/FLU/RSV plus assay is intended as an aid in the diagnosis of influenza from Nasopharyngeal swab specimens and should not be used as a sole basis for treatment. Nasal washings and aspirates are unacceptable for Xpert Xpress SARS-CoV-2/FLU/RSV testing.  Fact Sheet for Patients: EntrepreneurPulse.com.au  Fact Sheet for Healthcare Providers: IncredibleEmployment.be  This test is not yet approved or cleared by the [place] FDA and has been authorized for detection and/or diagnosis of SARS-CoV-2 by FDA under an Emergency Use Authorization (EUA). This EUA will remain in effect (meaning this test can be used) for the duration of the COVID-19 declaration under Section 564(b)(1) of the Act, 21 U.S.C. section 360bbb-3(b)(1), unless the authorization is terminated or revoked.     Resp Syncytial Virus by PCR NEGATIVE NEGATIVE Final    Comment: (NOTE) Fact Sheet for Patients: EntrepreneurPulse.com.au  Fact Sheet for Healthcare Providers: IncredibleEmployment.be  This test is not yet approved or cleared by the [place] FDA  and has been authorized for detection and/or diagnosis of SARS-CoV-2 by FDA under an Emergency Use Authorization (EUA). This EUA will remain in effect (meaning this test can be used) for the duration of the COVID-19 declaration under Section 564(b)(1) of the Act, 21 U.S.C. section 360bbb-3(b)(1), unless the authorization is terminated or revoked.  Performed at [facility], [address], [place], [place] [address]   Blood Culture (routine x 2)     Status: None (Preliminary result)   Collection Time: 05/19/22  6:33 PM   Specimen: Site Not Specified; Blood  Result Value Ref Range Status   Specimen Description   Final    SITE NOT SPECIFIED BOTTLES DRAWN AEROBIC AND ANAEROBIC   Special Requests Blood Culture adequate volume  Final   Culture   Final    NO GROWTH < 12 HOURS Performed at [facility], [address] [place], [place] [address]    Report Status PENDING  Incomplete  Blood Culture (routine x 2)     Status: None (Preliminary result)   Collection Time: 05/19/22  7:59 PM   Specimen: BLOOD RIGHT HAND  Result Value Ref Range Status   Specimen Description   Final    BLOOD RIGHT HAND BOTTLES DRAWN AEROBIC AND ANAEROBIC   Special Requests Blood Culture adequate volume  Final   Culture   Final    NO GROWTH < 12 HOURS Performed at [facility], [address], [place], [place] [address]    Report Status PENDING  Incomplete  MRSA Next Gen by PCR, Nasal     Status: None   Collection Time: 05/19/22 11:00 PM   Specimen: Nasal Mucosa; Nasal Swab  Result Value Ref Range Status   MRSA by PCR Next Gen NOT DETECTED NOT DETECTED Final    Comment: (NOTE) The GeneXpert MRSA Assay (FDA approved for NASAL specimens only), is one component of a comprehensive MRSA colonization surveillance program. It is not intended to diagnose MRSA infection nor to guide or monitor treatment for MRSA infections. Test performance is not FDA approved in patients less than 56 years old. Performed at [facility], [address], [place], [place] [address]       Studies: CT CHEST ABDOMEN PELVIS WO CONTRAST  Result Date: 05/19/2022 CLINICAL DATA:  Sepsis, tested positive for COVID-19 yesterday, nausea, vomiting, diarrhea, hypoxemia EXAM: CT CHEST, ABDOMEN AND PELVIS WITHOUT CONTRAST TECHNIQUE: Multidetector CT imaging of the chest, abdomen and pelvis was performed following the standard protocol without IV contrast. RADIATION DOSE REDUCTION: This exam was performed according to the departmental dose-optimization program which includes automated exposure control, adjustment of the mA and/or kV according to patient size and/or use of iterative reconstruction technique. COMPARISON:  03/03/2022 CT abdomen and pelvis FINDINGS: CT CHEST FINDINGS Cardiovascular: Atherosclerotic calcifications aorta, proximal great vessels, and coronary arteries. Aneurysmal dilatation ascending thoracic aorta 4.1 cm transverse image 25. Heart size normal. No pericardial effusion. Mediastinum/Nodes: Question small hiatal hernia. Esophagus normal. Base of cervical region normal appearance. No thoracic adenopathy. Lungs/Pleura: Peribronchial thickening. Dependent atelectasis BILATERAL lower lobes, less in upper lobes. No definite infiltrate, pleural effusion, or pneumothorax. Musculoskeletal: No acute osseous findings. CT ABDOMEN PELVIS FINDINGS Hepatobiliary: 2.3 cm diameter gallstone within gallbladder. Gallbladder and liver otherwise normal appearance. Pancreas: Normal appearance Spleen: Normal appearance Adrenals/Urinary Tract: Adrenal glands normal appearance. BILATERAL renal cysts unchanged, largest 2.2 cm diameter anterior LEFT kidney image 54; no follow-up imaging recommended. No hydronephrosis, hydroureter or urinary tract calcification. Bladder unremarkable Stomach/Bowel: Diffuse colonic diverticulosis without evidence of diverticulitis. Wall thickening of the ascending colon through hepatic flexure of colon question colitis.  Stomach unremarkable. Mild fecalization of small bowel contents distally though no point of obstruction, bowel wall thickening, or small bowel dilatation is identified. Vascular/Lymphatic: Tortuous abdominal aorta with scattered atherosclerotic calcifications. Aorta normal caliber. Additional atherosclerotic calcifications within iliac arteries. No adenopathy. Reproductive: Minimal prostatic enlargement for age Other: No free air or free fluid. RIGHT inguinal and small umbilical hernias containing fat. Musculoskeletal: Degenerative changes lumbar spine and LEFT hip joint IMPRESSION: Bibasilar atelectasis without definite infiltrate. Diffuse colonic diverticulosis without evidence of diverticulitis. Wall thickening of the ascending colon through hepatic flexure of colon question colitis; this could be due to infection, inflammatory bowel disease, or less likely ischemia. Cholelithiasis. RIGHT inguinal and small umbilical hernias containing fat. Aneurysmal dilatation of ascending thoracic aorta 4.1 cm transverse, recommendation below. Recommend annual imaging followup by CTA or MRA. This recommendation follows 2010 ACCF/AHA/AATS/ACR/ASA/SCA/SCAI/SIR/STS/SVM Guidelines for the Diagnosis and  Management of Patients with Thoracic Aortic Disease. Circulation. 2010; 121: G818-H631. Aortic aneurysm NOS (ICD10-I71.9) Aortic Atherosclerosis (ICD10-I70.0). Aortic aneurysm NOS (ICD10-I71.9). Electronically Signed   By: Lavonia Dana M.D.   On: 05/19/2022 21:12   CT Head Wo Contrast  Result Date: 05/19/2022 CLINICAL DATA:  Syncope EXAM: CT HEAD WITHOUT CONTRAST TECHNIQUE: Contiguous axial images were obtained from the base of the skull through the vertex without intravenous contrast. RADIATION DOSE REDUCTION: This exam was performed according to the departmental dose-optimization program which includes automated exposure control, adjustment of the mA and/or kV according to patient size and/or use of iterative reconstruction  technique. COMPARISON:  March 03, 2022 FINDINGS: Brain: There is mild cerebral atrophy with widening of the extra-axial spaces and ventricular dilatation. There are areas of decreased attenuation within the white matter tracts of the supratentorial brain, consistent with microvascular disease changes. Vascular: No hyperdense vessel or unexpected calcification. Skull: Normal. Negative for fracture or focal lesion. Sinuses/Orbits: There is moderate to marked severity bilateral ethmoid sinus mucosal thickening. Other: None. IMPRESSION: 1. No acute intracranial abnormality. 2. Generalized cerebral atrophy with widening of the extra-axial spaces and ventricular dilatation. 3. Moderate to marked severity bilateral ethmoid sinus disease. Electronically Signed   By: Virgina Norfolk M.D.   On: 05/19/2022 20:58   DG Chest Port 1 View  Result Date: 05/19/2022 CLINICAL DATA:  Questionable sepsis. EXAM: PORTABLE CHEST 1 VIEW COMPARISON:  03/03/2022 FINDINGS: 1824 hours. Low volume film. The cardio pericardial silhouette is enlarged. There is pulmonary vascular congestion without overt pulmonary edema. No focal airspace consolidation or overt pulmonary edema. No substantial pleural effusion. The visualized bony structures of the thorax are unremarkable. Telemetry leads overlie the chest. IMPRESSION: Low volume film with vascular congestion. Electronically Signed   By: Misty Stanley M.D.   On: 05/19/2022 18:41    Scheduled Meds:  ALPRAZolam  0.5 mg Oral BID   Chlorhexidine Gluconate Cloth  6 each Topical Q0600   dorzolamide-timolol  1 drop Both Eyes BID   heparin  5,000 Units Subcutaneous Q8H   insulin aspart  0-5 Units Subcutaneous QHS   insulin aspart  0-9 Units Subcutaneous TID WC   ipratropium-albuterol  3 mL Nebulization Q6H   latanoprost  1 drop Both Eyes QHS   methylPREDNISolone (SOLU-MEDROL) injection  120 mg Intravenous Q12H   pantoprazole  40 mg Oral Daily   sertraline  100 mg Oral Daily     Continuous Infusions:  ceFEPime (MAXIPIME) IV Stopped (05/20/22 0028)   lactated ringers 50 mL/hr at 05/20/22 0317   metronidazole 500 mg (05/20/22 1018)   norepinephrine (LEVOPHED) Adult infusion Stopped (05/20/22 0430)   remdesivir 100 mg in sodium chloride 0.9 % 100 mL IVPB       LOS: 1 day     Kayleen Memos, MD [facility] Pager [phone]  If 7PM-7AM, please contact night-coverage [email or web address] Password [email or web address] 05/20/2022, 1:42 PM

## 2022-05-20 NOTE — Progress Notes (Signed)
Pharmacy Antibiotic Note  Tim Henry is a 89 y.o. male admitted on 05/19/2022 with  covid pneumonia and sepsis .  Pharmacy has been consulted for vancomycin and cefepime dosing.  Tmax 99.1, wbc 8, LA 1.4 Patient's aki is improving with Scr down 2.4>1.8.   Patient was loaded with vancomycin last night, will plan on checking random vancomycin level in am and redose once level within therapeutic range.    Plan: Random vancomycin level in am Cefepime 2g q24 hours Follow up culture data and renal function    Height: 5\' 11"  (180.3 cm) Weight: 102 kg (224 lb 13.9 oz) IBW/kg (Calculated) : 75.3  Temp (24hrs), Avg:97.6 F (36.4 C), Min:94.6 F (34.8 C), Max:99.1 F (37.3 C)  Recent Labs  Lab 05/19/22 1833 05/19/22 1835 05/19/22 1959 05/19/22 2240 05/20/22 0055 05/20/22 0411  WBC 11.1*  --   --   --   --  8.5  CREATININE 2.26* 2.40*  --   --   --  1.81*  LATICACIDVEN 2.0*  --  3.2* 3.0* 1.1 1.4    Estimated Creatinine Clearance: 31.7 mL/min (A) (by C-G formula based on SCr of 1.81 mg/dL (H)).    No Known Allergies  Antimicrobials this admission: Remdesivir x3d Vanc 1/20> Cefepime 1/20> Flagyl 1/20>  Micro:  1/20 urine: sent 1/20 bldx2: ngtd Covid+ MRSA-  Thank you for allowing pharmacy to be a part of this patient's care.  Erin Hearing PharmD., BCPS Clinical Pharmacist 05/20/2022 2:54 PM

## 2022-05-20 NOTE — Consult Note (Signed)
[facility] Consult  Reason for Consult: Colitis, concern for possible ischemic colitis Referring Physician: Dr. Durwin Nora  Chief Complaint   Loss of Consciousness     HPI: Tim Henry is a 89 y.o. male who presented with several week history of weakness.  Tim Henry recently tested positive for COVID-19, and was having some nausea and diarrhea yesterday.  Tim Henry denied any episodes of vomiting.  EMS was called secondary to his weakness and lethargy, and Tim Henry was noted to be slightly hypotensive and had a syncopal episode.  His past medical history significant for hypertension, hyperlipidemia, diabetes, GERD, and BPH.  In the ED, Tim Henry underwent a CT abdomen and pelvis which demonstrated concern for ascending colon colitis, likely infectious or inflammatory, less likely ischemic.  Tim Henry had a mild leukocytosis of 11.1.  Tim Henry also had an elevated lactic acid at 3.2.  Tim Henry did require norepinephrine for small period of time overnight.  Per the ED physician, patient denied any abdominal pain, had a benign abdominal exam, and had no bloody bowel movements.  Upon evaluation this morning, patient denies any abdominal pain, nausea, or vomiting.  Per nurse, Tim Henry was able to tolerate a sandwich and Ensure without difficulty.  Lactic acid normalized at 1 AM to 1.1.  Patient has no acute complaints at this time.  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)    none since release from hosp    Past Surgical History:  Procedure Laterality Date   APPENDECTOMY     childhood   BACK SURGERY  1963   CATARACT EXTRACTION W/PHACO Left 09/15/2014    Procedure: CATARACT EXTRACTION PHACO AND INTRAOCULAR LENS PLACEMENT (IOC);  Surgeon: Lockie Mola, MD;  Location: [facility];  Service: Ophthalmology;  Laterality: Left;  DIABETIC PT WOULD LIKE TO HAVE ARRIVAL TIME LATE AM   JOINT REPLACEMENT Left    knee   PHOTOCOAGULATION WITH LASER Right 01/23/2016   Procedure: PHOTOCOAGULATION WITH LASER;  Surgeon: Sherald Hess, MD;  Location: [facility];  Service: Ophthalmology;  Laterality: Right;  DIABETIC - oral meds RIGHT Requests arrival 10AM or after    Family History  Problem Relation Age of Onset   Heart attack Mother    Diabetes Mother    Heart attack Father    Diabetes Sister     Social History   Tobacco Use   Smoking status: Former    Types: Cigarettes    Quit date: 04/30/1966    Years since quitting: 56.0   Smokeless tobacco: Never  Substance Use Topics   Alcohol use: No   Drug use: No    Medications: I have reviewed the patient's current medications.  No Known Allergies   ROS:  Pertinent items are noted in HPI.  Blood pressure 133/64, pulse 61, temperature 98.3 F (36.8 C), temperature source Axillary, resp. rate 19, height 5\' 11"  (1.803 m), weight 102 kg, SpO2 100 %. Physical Exam Vitals reviewed.  Constitutional:      Appearance: Normal appearance.  HENT:  Head: Normocephalic and atraumatic.  Eyes:     Extraocular Movements: Extraocular movements intact.     Pupils: Pupils are equal, round, and reactive to light.  Cardiovascular:     Rate and Rhythm: Normal rate.  Pulmonary:     Effort: Pulmonary effort is normal.  Abdominal:     Comments: Abdomen soft, nondistended, no percussion tenderness, nontender to palpation; no rigidity, guarding, or rebound tenderness  Musculoskeletal:     Cervical back: Normal range of motion.  Skin:    General: Skin is warm and dry.  Neurological:     General: No focal deficit present.     Mental Status: Tim Henry is alert.  Psychiatric:         Mood and Affect: Mood normal.        Behavior: Behavior normal.     Results: Results for orders placed or performed during the hospital encounter of 05/19/22 (from the past 48 hour(s))  CBG monitoring, ED     Status: Abnormal   Collection Time: 05/19/22  6:14 PM  Result Value Ref Range   Glucose-Capillary 161 (H) 70 - 99 mg/dL    Comment: Glucose reference range applies only to samples taken after fasting for at least 8 hours.  Resp panel by RT-PCR (RSV, Flu A&B, Covid) Anterior Nasal Swab     Status: Abnormal   Collection Time: 05/19/22  6:27 PM   Specimen: Anterior Nasal Swab  Result Value Ref Range   SARS Coronavirus 2 by RT PCR POSITIVE (A) NEGATIVE    Comment: (NOTE) SARS-CoV-2 target nucleic acids are DETECTED.  The SARS-CoV-2 RNA is generally detectable in upper respiratory specimens during the acute phase of infection. Positive results are indicative of the presence of the identified virus, but do not rule out bacterial infection or co-infection with other pathogens not detected by the test. Clinical correlation with patient history and other diagnostic information is necessary to determine patient infection status. The expected result is Negative.  Fact Sheet for Patients: BloggerCourse.com  Fact Sheet for Healthcare Providers: SeriousBroker.it  This test is not yet approved or cleared by the [place] FDA and  has been authorized for detection and/or diagnosis of SARS-CoV-2 by FDA under an Emergency Use Authorization (EUA).  This EUA will remain in effect (meaning this test can be used) for the duration of  the COVID-19 declaration under Section 564(b)(1) of the A ct, 21 U.S.C. section 360bbb-3(b)(1), unless the authorization is terminated or revoked sooner.     Influenza A by PCR NEGATIVE NEGATIVE   Influenza B by PCR NEGATIVE NEGATIVE    Comment: (NOTE) The Xpert Xpress SARS-CoV-2/FLU/RSV plus assay is  intended as an aid in the diagnosis of influenza from Nasopharyngeal swab specimens and should not be used as a sole basis for treatment. Nasal washings and aspirates are unacceptable for Xpert Xpress SARS-CoV-2/FLU/RSV testing.  Fact Sheet for Patients: BloggerCourse.com  Fact Sheet for Healthcare Providers: SeriousBroker.it  This test is not yet approved or cleared by the [place] FDA and has been authorized for detection and/or diagnosis of SARS-CoV-2 by FDA under an Emergency Use Authorization (EUA). This EUA will remain in effect (meaning this test can be used) for the duration of the COVID-19 declaration under Section 564(b)(1) of the Act, 21 U.S.C. section 360bbb-3(b)(1), unless the authorization is terminated or revoked.     Resp Syncytial Virus by PCR NEGATIVE NEGATIVE    Comment: (NOTE) Fact Sheet for Patients: BloggerCourse.com  Fact Sheet for Healthcare Providers: SeriousBroker.it  This test is not yet approved or cleared by the [place] FDA and has been authorized for detection and/or diagnosis of SARS-CoV-2 by FDA under an Emergency Use Authorization (EUA). This EUA will remain in effect (meaning this test can be used) for the duration of the COVID-19 declaration under Section 564(b)(1) of the Act, 21 U.S.C. section 360bbb-3(b)(1), unless the authorization is terminated or revoked.  Performed at [facility], [address] [place], [place] [address]   Lactic acid, plasma     Status: Abnormal   Collection Time: 05/19/22  6:33 PM  Result Value Ref Range   Lactic Acid, Venous 2.0 (HH) 0.5 - 1.9 mmol/L    Comment: CRITICAL RESULT CALLED TO, READ BACK BY AND VERIFIED WITH: SAPPELT,J AT 1903 ON 05/19/2022 BY MOSLEY,J Performed at [facility], [address] [place], [place] [address]   Comprehensive metabolic panel     Status: Abnormal   Collection Time:  05/19/22  6:33 PM  Result Value Ref Range   Sodium 133 (L) 135 - 145 mmol/L   Potassium 4.2 3.5 - 5.1 mmol/L   Chloride 100 98 - 111 mmol/L   CO2 25 22 - 32 mmol/L   Glucose, Bld 210 (H) 70 - 99 mg/dL    Comment: Glucose reference range applies only to samples taken after fasting for at least 8 hours.   BUN 33 (H) 8 - 23 mg/dL   Creatinine, Ser 6.572.26 (H) 0.61 - 1.24 mg/dL   Calcium 8.1 (L) 8.9 - 10.3 mg/dL   Total Protein 6.0 (L) 6.5 - 8.1 g/dL   Albumin 3.0 (L) 3.5 - 5.0 g/dL   AST 28 15 - 41 U/L   ALT 16 0 - 44 U/L   Alkaline Phosphatase 49 38 - 126 U/L   Total Bilirubin 1.0 0.3 - 1.2 mg/dL   GFR, Estimated 27 (L) >60 mL/min    Comment: (NOTE) Calculated using the CKD-EPI Creatinine Equation (2021)    Anion gap 8 5 - 15    Comment: Performed at [facility], [address] [place], [place] [address]  CBC with Differential     Status: Abnormal   Collection Time: 05/19/22  6:33 PM  Result Value Ref Range   WBC 11.1 (H) 4.0 - 10.5 K/uL   RBC 3.05 (L) 4.22 - 5.81 MIL/uL   Hemoglobin 10.1 (L) 13.0 - 17.0 g/dL   HCT 29.531.6 (L) 28.439.0 - 13.252.0 %   MCV 103.6 (H) 80.0 - 100.0 fL   MCH 33.1 26.0 - 34.0 pg   MCHC 32.0 30.0 - 36.0 g/dL   RDW 44.013.2 10.211.5 - 72.515.5 %   Platelets 129 (L) 150 - 400 K/uL   nRBC 0.0 0.0 - 0.2 %   Neutrophils Relative % 71 %   Neutro Abs 7.9 (H) 1.7 - 7.7 K/uL   Lymphocytes Relative 17 %   Lymphs Abs 1.8 0.7 - 4.0 K/uL   Monocytes Relative 10 %   Monocytes Absolute 1.1 (H) 0.1 - 1.0 K/uL   Eosinophils Relative 1 %   Eosinophils Absolute 0.2 0.0 - 0.5 K/uL   Basophils Relative 0 %   Basophils Absolute 0.0 0.0 - 0.1 K/uL   Immature Granulocytes 1 %   Abs Immature Granulocytes 0.10 (H) 0.00 - 0.07 K/uL    Comment: Performed at [facility], [address] [place], [place] [address]  Protime-INR     Status: None   Collection Time: 05/19/22  6:33 PM  Result Value Ref Range  Prothrombin Time 14.9 11.4 - 15.2 seconds   INR 1.2 0.8 - 1.2    Comment: (NOTE) INR  goal varies based on device and disease states. Performed at [facility], [address] [place], [place] [address]   Blood Culture (routine x 2)     Status: None (Preliminary result)   Collection Time: 05/19/22  6:33 PM   Specimen: Site Not Specified; Blood  Result Value Ref Range   Specimen Description      SITE NOT SPECIFIED BOTTLES DRAWN AEROBIC AND ANAEROBIC   Special Requests Blood Culture adequate volume    Culture      NO GROWTH < 12 HOURS Performed at [facility], [address], [place], [place] [address]    Report Status PENDING   Blood gas, venous ([facility], [facility], [facility])     Status: Abnormal   Collection Time: 05/19/22  6:33 PM  Result Value Ref Range   FIO2 36.00 %   pH, Ven 7.37 7.25 - 7.43   pCO2, Ven 47 44 - 60 mmHg   pO2, Ven <31 (LL) 32 - 45 mmHg    Comment: CRITICAL RESULT CALLED TO, READ BACK BY AND VERIFIED WITH: BLACKBURN,C @ 1852 ON 05/19/22 BY JUW    Bicarbonate 28.1 (H) 20.0 - 28.0 mmol/L   Acid-Base Excess 1.4 0.0 - 2.0 mmol/L   O2 Saturation 26.1 %   Patient temperature 34.8    Collection site BLOOD RIGHT FOREARM    Drawn by [phone]     Comment: Performed at [facility], [address], [place], [place] [address]  Troponin I (High Sensitivity)     Status: None   Collection Time: 05/19/22  6:33 PM  Result Value Ref Range   Troponin I (High Sensitivity) 7 <18 ng/L    Comment: (NOTE) Elevated high sensitivity troponin I (hsTnI) values and significant  changes across serial measurements may suggest ACS but many other  chronic and acute conditions are known to elevate hsTnI results.  Refer to the "Links" section for chest pain algorithms and additional  guidance. Performed at [facility], [address], [place], [place] [address]   Magnesium     Status: None   Collection Time: 05/19/22  6:33 PM  Result Value Ref Range   Magnesium 1.7 1.7 - 2.4 mg/dL    Comment: Performed at [facility], [address] [place], [place] [address]  I-stat chem 8, ED      Status: Abnormal   Collection Time: 05/19/22  6:35 PM  Result Value Ref Range   Sodium 137 135 - 145 mmol/L   Potassium 4.4 3.5 - 5.1 mmol/L   Chloride 101 98 - 111 mmol/L   BUN 29 (H) 8 - 23 mg/dL   Creatinine, Ser 5.78 (H) 0.61 - 1.24 mg/dL   Glucose, Bld 469 (H) 70 - 99 mg/dL    Comment: Glucose reference range applies only to samples taken after fasting for at least 8 hours.   Calcium, Ion 1.14 (L) 1.15 - 1.40 mmol/L   TCO2 25 22 - 32 mmol/L   Hemoglobin 10.9 (L) 13.0 - 17.0 g/dL   HCT 62.9 (L) 52.8 - 41.3 %  Lactic acid, plasma     Status: Abnormal   Collection Time: 05/19/22  7:59 PM  Result Value Ref Range   Lactic Acid, Venous 3.2 (HH) 0.5 - 1.9 mmol/L    Comment: CRITICAL VALUE NOTED.  VALUE IS CONSISTENT WITH PREVIOUSLY REPORTED AND CALLED VALUE. Performed at [facility], [phone]  [address] [place], [place] [address]   Blood Culture (routine x 2)     Status: None (Preliminary result)   Collection Time: 05/19/22  7:59 PM   Specimen: BLOOD RIGHT HAND  Result Value Ref Range   Specimen Description      BLOOD RIGHT HAND BOTTLES DRAWN AEROBIC AND ANAEROBIC   Special Requests Blood Culture adequate volume    Culture      NO GROWTH < 12 HOURS Performed at [facility], [address], [place], [place] [address]    Report Status PENDING   Troponin I (High Sensitivity)     Status: None   Collection Time: 05/19/22  7:59 PM  Result Value Ref Range   Troponin I (High Sensitivity) 6 <18 ng/L    Comment: (NOTE) Elevated high sensitivity troponin I (hsTnI) values and significant  changes across serial measurements may suggest ACS but many other  chronic and acute conditions are known to elevate hsTnI results.  Refer to the "Links" section for chest pain algorithms and additional  guidance. Performed at [facility], [address], [place], [place] [address]   Lactic acid, plasma     Status: Abnormal   Collection Time: 05/19/22 10:40 PM  Result Value Ref Range   Lactic Acid,  Venous 3.0 (HH) 0.5 - 1.9 mmol/L    Comment: CRITICAL RESULT CALLED TO, READ BACK BY AND VERIFIED WITH: ESTOCE,R @ 2315 ON 05/19/22 BY JUW Performed at [facility], [address], [place], [place] [address]   Protime-INR     Status: None   Collection Time: 05/19/22 10:40 PM  Result Value Ref Range   Prothrombin Time 15.1 11.4 - 15.2 seconds   INR 1.2 0.8 - 1.2    Comment: (NOTE) INR goal varies based on device and disease states. Performed at [facility], [address], [place], [place] [address]   APTT     Status: None   Collection Time: 05/19/22 10:40 PM  Result Value Ref Range   aPTT 31 24 - 36 seconds    Comment: Performed at [facility], [address], [place], [place] [address]  Urinalysis, Routine w reflex microscopic     Status: Abnormal   Collection Time: 05/19/22 11:00 PM  Result Value Ref Range   Color, Urine YELLOW YELLOW   APPearance HAZY (A) CLEAR   Specific Gravity, Urine 1.016 1.005 - 1.030   pH 5.0 5.0 - 8.0   Glucose, UA NEGATIVE NEGATIVE mg/dL   Hgb urine dipstick SMALL (A) NEGATIVE   Bilirubin Urine NEGATIVE NEGATIVE   Ketones, ur 5 (A) NEGATIVE mg/dL   Protein, ur 30 (A) NEGATIVE mg/dL   Nitrite NEGATIVE NEGATIVE   Leukocytes,Ua NEGATIVE NEGATIVE   RBC / HPF 0-5 0 - 5 RBC/hpf   WBC, UA 0-5 0 - 5 WBC/hpf   Bacteria, UA RARE (A) NONE SEEN   Squamous Epithelial / HPF 0-5 0 - 5 /HPF   Mucus PRESENT    Hyaline Casts, UA PRESENT     Comment: Performed at [facility], [address], [place], [place] [address]  MRSA Next Gen by PCR, Nasal     Status: None   Collection Time: 05/19/22 11:00 PM   Specimen: Nasal Mucosa; Nasal Swab  Result Value Ref Range   MRSA by PCR Next Gen NOT DETECTED NOT DETECTED    Comment: (NOTE) The GeneXpert MRSA Assay (FDA approved for NASAL specimens only), is one component of a comprehensive MRSA colonization surveillance program. It is not intended to  diagnose MRSA infection nor to guide or monitor treatment for MRSA  infections. Test performance is not FDA approved in patients less than 67 years old. Performed at [facility], [address] [place], [place] [address]   Lactic acid, plasma     Status: None   Collection Time: 05/20/22 12:55 AM  Result Value Ref Range   Lactic Acid, Venous 1.1 0.5 - 1.9 mmol/L    Comment: Performed at [facility], [address], [place], [place] [address]  Basic metabolic panel     Status: Abnormal   Collection Time: 05/20/22  4:11 AM  Result Value Ref Range   Sodium 135 135 - 145 mmol/L   Potassium 4.2 3.5 - 5.1 mmol/L   Chloride 105 98 - 111 mmol/L   CO2 21 (L) 22 - 32 mmol/L   Glucose, Bld 213 (H) 70 - 99 mg/dL    Comment: Glucose reference range applies only to samples taken after fasting for at least 8 hours.   BUN 32 (H) 8 - 23 mg/dL   Creatinine, Ser 1.81 (H) 0.61 - 1.24 mg/dL   Calcium 8.1 (L) 8.9 - 10.3 mg/dL   GFR, Estimated 35 (L) >60 mL/min    Comment: (NOTE) Calculated using the CKD-EPI Creatinine Equation (2021)    Anion gap 9 5 - 15    Comment: Performed at [facility], [address] [place], [place] [address]  CBC     Status: Abnormal   Collection Time: 05/20/22  4:11 AM  Result Value Ref Range   WBC 8.5 4.0 - 10.5 K/uL   RBC 3.34 (L) 4.22 - 5.81 MIL/uL   Hemoglobin 10.7 (L) 13.0 - 17.0 g/dL   HCT 34.9 (L) 39.0 - 52.0 %   MCV 104.5 (H) 80.0 - 100.0 fL   MCH 32.0 26.0 - 34.0 pg   MCHC 30.7 30.0 - 36.0 g/dL   RDW 12.8 11.5 - 15.5 %   Platelets 136 (L) 150 - 400 K/uL   nRBC 0.0 0.0 - 0.2 %    Comment: Performed at [facility], [address], [place], [place] [address]  Lactic acid, plasma     Status: None   Collection Time: 05/20/22  4:11 AM  Result Value Ref Range   Lactic Acid, Venous 1.4 0.5 - 1.9 mmol/L    Comment: Performed at [facility], [address] [place], [place] [address]  Brain natriuretic peptide     Status: Abnormal   Collection Time: 05/20/22  7:14 AM  Result Value Ref Range   B Natriuretic Peptide 108.0 (H) 0.0 -  100.0 pg/mL    Comment: Performed at [facility], [address], [place], [place] [address]  Glucose, capillary     Status: Abnormal   Collection Time: 05/20/22  7:33 AM  Result Value Ref Range   Glucose-Capillary 169 (H) 70 - 99 mg/dL    Comment: Glucose reference range applies only to samples taken after fasting for at least 8 hours.  Glucose, capillary     Status: Abnormal   Collection Time: 05/20/22 11:21 AM  Result Value Ref Range   Glucose-Capillary 206 (H) 70 - 99 mg/dL    Comment: Glucose reference range applies only to samples taken after fasting for at least 8 hours.    CT CHEST ABDOMEN PELVIS WO CONTRAST  Result Date: 05/19/2022 CLINICAL DATA:  Sepsis, tested positive for COVID-19 yesterday, nausea, vomiting, diarrhea, hypoxemia EXAM: CT CHEST, ABDOMEN AND PELVIS WITHOUT CONTRAST TECHNIQUE: Multidetector CT imaging of the  chest, abdomen and pelvis was performed following the standard protocol without IV contrast. RADIATION DOSE REDUCTION: This exam was performed according to the departmental dose-optimization program which includes automated exposure control, adjustment of the mA and/or kV according to patient size and/or use of iterative reconstruction technique. COMPARISON:  03/03/2022 CT abdomen and pelvis FINDINGS: CT CHEST FINDINGS Cardiovascular: Atherosclerotic calcifications aorta, proximal great vessels, and coronary arteries. Aneurysmal dilatation ascending thoracic aorta 4.1 cm transverse image 25. Heart size normal. No pericardial effusion. Mediastinum/Nodes: Question small hiatal hernia. Esophagus normal. Base of cervical region normal appearance. No thoracic adenopathy. Lungs/Pleura: Peribronchial thickening. Dependent atelectasis BILATERAL lower lobes, less in upper lobes. No definite infiltrate, pleural effusion, or pneumothorax. Musculoskeletal: No acute osseous findings. CT ABDOMEN PELVIS FINDINGS Hepatobiliary: 2.3 cm diameter gallstone within gallbladder. Gallbladder  and liver otherwise normal appearance. Pancreas: Normal appearance Spleen: Normal appearance Adrenals/Urinary Tract: Adrenal glands normal appearance. BILATERAL renal cysts unchanged, largest 2.2 cm diameter anterior LEFT kidney image 54; no follow-up imaging recommended. No hydronephrosis, hydroureter or urinary tract calcification. Bladder unremarkable Stomach/Bowel: Diffuse colonic diverticulosis without evidence of diverticulitis. Wall thickening of the ascending colon through hepatic flexure of colon question colitis. Stomach unremarkable. Mild fecalization of small bowel contents distally though no point of obstruction, bowel wall thickening, or small bowel dilatation is identified. Vascular/Lymphatic: Tortuous abdominal aorta with scattered atherosclerotic calcifications. Aorta normal caliber. Additional atherosclerotic calcifications within iliac arteries. No adenopathy. Reproductive: Minimal prostatic enlargement for age Other: No free air or free fluid. RIGHT inguinal and small umbilical hernias containing fat. Musculoskeletal: Degenerative changes lumbar spine and LEFT hip joint IMPRESSION: Bibasilar atelectasis without definite infiltrate. Diffuse colonic diverticulosis without evidence of diverticulitis. Wall thickening of the ascending colon through hepatic flexure of colon question colitis; this could be due to infection, inflammatory bowel disease, or less likely ischemia. Cholelithiasis. RIGHT inguinal and small umbilical hernias containing fat. Aneurysmal dilatation of ascending thoracic aorta 4.1 cm transverse, recommendation below. Recommend annual imaging followup by CTA or MRA. This recommendation follows 2010 ACCF/AHA/AATS/ACR/ASA/SCA/SCAI/SIR/STS/SVM Guidelines for the Diagnosis and Management of Patients with Thoracic Aortic Disease. Circulation. 2010; 121: R427-C623. Aortic aneurysm NOS (ICD10-I71.9) Aortic Atherosclerosis (ICD10-I70.0). Aortic aneurysm NOS (ICD10-I71.9). Electronically  Signed   By: Lavonia Dana M.D.   On: 05/19/2022 21:12   CT Head Wo Contrast  Result Date: 05/19/2022 CLINICAL DATA:  Syncope EXAM: CT HEAD WITHOUT CONTRAST TECHNIQUE: Contiguous axial images were obtained from the base of the skull through the vertex without intravenous contrast. RADIATION DOSE REDUCTION: This exam was performed according to the departmental dose-optimization program which includes automated exposure control, adjustment of the mA and/or kV according to patient size and/or use of iterative reconstruction technique. COMPARISON:  March 03, 2022 FINDINGS: Brain: There is mild cerebral atrophy with widening of the extra-axial spaces and ventricular dilatation. There are areas of decreased attenuation within the white matter tracts of the supratentorial brain, consistent with microvascular disease changes. Vascular: No hyperdense vessel or unexpected calcification. Skull: Normal. Negative for fracture or focal lesion. Sinuses/Orbits: There is moderate to marked severity bilateral ethmoid sinus mucosal thickening. Other: None. IMPRESSION: 1. No acute intracranial abnormality. 2. Generalized cerebral atrophy with widening of the extra-axial spaces and ventricular dilatation. 3. Moderate to marked severity bilateral ethmoid sinus disease. Electronically Signed   By: Virgina Norfolk M.D.   On: 05/19/2022 20:58   DG Chest Port 1 View  Result Date: 05/19/2022 CLINICAL DATA:  Questionable sepsis. EXAM: PORTABLE CHEST 1 VIEW COMPARISON:  03/03/2022 FINDINGS: 1824 hours. Low volume film.  The cardio pericardial silhouette is enlarged. There is pulmonary vascular congestion without overt pulmonary edema. No focal airspace consolidation or overt pulmonary edema. No substantial pleural effusion. The visualized bony structures of the thorax are unremarkable. Telemetry leads overlie the chest. IMPRESSION: Low volume film with vascular congestion. Electronically Signed   By: Kennith CenterEric  Mansell M.D.   On: 05/19/2022  18:41     Assessment & Plan:  Bettey MareSalem C Esteve is a 89 y.o. male who was admitted with COVID and concern for septic shock given mild leukocytosis, colitis on CT, and need for vasopressor medications.  Lactic acid 3.2 at the highest.  Imaging and blood work evaluated by myself.  -I evaluated the imaging, and there is wall thickening of the ascending colon, however it is difficult to fully assess given the lack of IV contrast on the imaging study.   -Lactic acid normalized to 1.1 overnight, and Tim Henry has no leukocytosis this morning, WBC 8.5 -Patient has been weaned off of Levophed -Given his benign abdominal exam, normalization of leukocytosis, normal lactic acid, and no bloody bowel movements, I suspect this patient has an infectious versus inflammatory colitis.  Ischemic colitis is much less likely -Okay for diet from general surgery standpoint -Antibiotics per primary team -No acute surgical intervention -Care per primary team -Please call with any questions or concerns  All questions were answered to the satisfaction of the patient.  -- Theophilus Kindsatherine Cassiopeia Florentino, DO [facility] [address] [place], [place] [id number] [phone] (office)

## 2022-05-20 NOTE — Progress Notes (Signed)
Patient awake and alert. Vital signs stable. Patient son Tim Henry in to visit for several hours and had several questions that writer was bale to answer. Gen Surgery signed off on the case and mentioned that no surgery was needed. Patient mobilized at highest level of mobility and is now sitting in the bedside chair. Safety maintained, call bell within reach, bed in lowest position, bed alarm on and functioning without any issues. Will continue to monitor and endorse.

## 2022-05-21 DIAGNOSIS — A419 Sepsis, unspecified organism: Secondary | ICD-10-CM | POA: Diagnosis not present

## 2022-05-21 DIAGNOSIS — R6521 Severe sepsis with septic shock: Secondary | ICD-10-CM | POA: Diagnosis not present

## 2022-05-21 LAB — BASIC METABOLIC PANEL WITH GFR
Anion gap: 8 (ref 5–15)
BUN: 34 mg/dL — ABNORMAL HIGH (ref 8–23)
CO2: 23 mmol/L (ref 22–32)
Calcium: 8.1 mg/dL — ABNORMAL LOW (ref 8.9–10.3)
Chloride: 104 mmol/L (ref 98–111)
Creatinine, Ser: 1.48 mg/dL — ABNORMAL HIGH (ref 0.61–1.24)
GFR, Estimated: 44 mL/min — ABNORMAL LOW (ref >60)
Glucose, Bld: 205 mg/dL — ABNORMAL HIGH (ref 70–99)
Potassium: 3.8 mmol/L (ref 3.5–5.1)
Sodium: 135 mmol/L (ref 135–145)

## 2022-05-21 LAB — GLUCOSE, CAPILLARY
Glucose-Capillary: 199 mg/dL — ABNORMAL HIGH (ref 70–99)
Glucose-Capillary: 248 mg/dL — ABNORMAL HIGH (ref 70–99)
Glucose-Capillary: 255 mg/dL — ABNORMAL HIGH (ref 70–99)
Glucose-Capillary: 310 mg/dL — ABNORMAL HIGH (ref 70–99)
Glucose-Capillary: 202 mg/dL — ABNORMAL HIGH (ref 70–99)

## 2022-05-21 LAB — URINE CULTURE: Culture: NO GROWTH

## 2022-05-21 LAB — VANCOMYCIN, RANDOM: Vancomycin Rm: 5 ug/mL

## 2022-05-21 MED ORDER — IPRATROPIUM-ALBUTEROL 0.5-2.5 (3) MG/3ML IN SOLN
3.0000 mL | Freq: Two times a day (BID) | RESPIRATORY_TRACT | Status: DC
  Administered 2022-05-21: 3 mL via RESPIRATORY_TRACT
  Filled 2022-05-21: qty 3

## 2022-05-21 MED ORDER — SODIUM CHLORIDE 0.9 % IV SOLN
2.0000 g | Freq: Two times a day (BID) | INTRAVENOUS | Status: DC
  Administered 2022-05-21 – 2022-05-22 (×3): 2 g via INTRAVENOUS
  Filled 2022-05-21 (×3): qty 12.5

## 2022-05-21 MED ORDER — ENOXAPARIN SODIUM 40 MG/0.4ML IJ SOSY
40.0000 mg | PREFILLED_SYRINGE | INTRAMUSCULAR | Status: DC
  Filled 2022-05-21: qty 0.4

## 2022-05-21 MED ORDER — METHYLPREDNISOLONE SODIUM SUCC 40 MG IJ SOLR
40.0000 mg | Freq: Every day | INTRAMUSCULAR | Status: DC
  Administered 2022-05-21 – 2022-05-22 (×2): 40 mg via INTRAVENOUS
  Filled 2022-05-21 (×2): qty 1

## 2022-05-21 NOTE — Evaluation (Signed)
Physical Therapy Evaluation Patient Details Name: Tim Henry MRN: [id number] DOB: 1927-05-28 Today's Date: 05/21/2022  History of Present Illness  89 y.o. M admitted on 05/19/22 due to lethargy, weakness, and nausea/vomiting/diarrhea. Pt found to be Covid+ on 1/19.  PMH significant for hypertension, hyperlipidemia T2DM, GERD, BPH.   Clinical Impression  Patient requires repeated verbal cueing for following directions most likely due to [facility], increased time for sitting up at bedside with labored movement, verbal cues for proper hand placement during sit to stands with fair carryover and able to take a few steps forward/backward at bedside before having to sit due to fatigue.  Patient tolerated sitting up in chair after therapy - RN aware.  Patient will benefit from continued skilled physical therapy in hospital and recommended venue below to increase strength, balance, endurance for safe ADLs and gait.         Recommendations for follow up therapy are one component of a multi-disciplinary discharge planning process, led by the attending physician.  Recommendations may be updated based on patient status, additional functional criteria and insurance authorization.  Follow Up Recommendations Home health PT      Assistance Recommended at Discharge Set up Supervision/Assistance  Patient can return home with the following  A little help with walking and/or transfers;A little help with bathing/dressing/bathroom;Assistance with cooking/housework;Help with stairs or ramp for entrance    Equipment Recommendations None recommended by PT  Recommendations for Other Services       Functional Status Assessment Patient has had a recent decline in their functional status and demonstrates the ability to make significant improvements in function in a reasonable and predictable amount of time.     Precautions / Restrictions Precautions Precautions: Fall Restrictions Weight Bearing Restrictions: No       Mobility  Bed Mobility Overal bed mobility: Needs Assistance Bed Mobility: Supine to Sit     Supine to sit: HOB elevated, Min assist     General bed mobility comments: increased time, labored movement, required frequent verbal/tactile cueing when scooting to EOB    Transfers Overall transfer level: Needs assistance Equipment used: Rolling walker (2 wheels) Transfers: Sit to/from Stand Sit to Stand: Min assist           General transfer comment: unsteady labored movement    Ambulation/Gait Ambulation/Gait assistance: Min Web designer (Feet): 15 Feet Assistive device: Rolling walker (2 wheels) Gait Pattern/deviations: Decreased step length - right, Decreased step length - left, Decreased stride length, Trunk flexed Gait velocity: decreased     General Gait Details: limited to taking steps forward/backward at bedside before having to sit due to fatigue and generalized weakness  Stairs            Wheelchair Mobility    Modified Rankin (Stroke Patients Only)       Balance Overall balance assessment: Needs assistance Sitting-balance support: Feet supported, No upper extremity supported Sitting balance-Leahy Scale: Fair Sitting balance - Comments: seated at EOB   Standing balance support: During functional activity, Bilateral upper extremity supported Standing balance-Leahy Scale: Poor Standing balance comment: fair/poor using RW                             Pertinent Vitals/Pain Pain Assessment Pain Assessment: No/denies pain    Home Living Family/patient expects to be discharged to:: Private residence Living Arrangements: Children Available Help at Discharge: Family;Available 24 hours/day Type of Home: House Home Access: Stairs to enter Entrance Stairs-Rails:  Right Entrance Stairs-Number of Steps: 3 steps   Home Layout: One level Home Equipment: Conservation officer, nature (2 wheels);Rollator (4 wheels);Shower seat;Grab bars -  toilet;Grab bars - tub/shower      Prior Function Prior Level of Function : Independent/Modified Independent             Mobility Comments: Community ambulator w/ RW, does not drive ADLs Comments: Independent     Hand Dominance   Dominant Hand: Right    Extremity/Trunk Assessment   Upper Extremity Assessment Upper Extremity Assessment: Defer to OT evaluation    Lower Extremity Assessment Lower Extremity Assessment: Generalized weakness    Cervical / Trunk Assessment Cervical / Trunk Assessment: Kyphotic  Communication   Communication: No difficulties  Cognition Arousal/Alertness: Awake/alert Behavior During Therapy: WFL for tasks assessed/performed Overall Cognitive Status: History of cognitive impairments - at baseline                                          General Comments General comments (skin integrity, edema, etc.): VSS on RA    Exercises     Assessment/Plan    PT Assessment Patient needs continued PT services  PT Problem List Decreased strength;Decreased activity tolerance;Decreased balance;Decreased mobility       PT Treatment Interventions DME instruction;Gait training;Stair training;Functional mobility training;Therapeutic activities;Therapeutic exercise;Patient/family education;Balance training    PT Goals (Current goals can be found in the Care Plan section)  Acute Rehab PT Goals Patient Stated Goal: return home with family to assist PT Goal Formulation: With patient Time For Goal Achievement: 05/28/22 Potential to Achieve Goals: Good    Frequency Min 3X/week     Co-evaluation PT/OT/SLP Co-Evaluation/Treatment: Yes Reason for Co-Treatment: To address functional/ADL transfers PT goals addressed during session: Mobility/safety with mobility;Balance;Proper use of DME         AM-PAC PT "6 Clicks" Mobility  Outcome Measure Help needed turning from your back to your side while in a flat bed without using bedrails?: A  Little Help needed moving from lying on your back to sitting on the side of a flat bed without using bedrails?: A Little Help needed moving to and from a bed to a chair (including a wheelchair)?: A Little Help needed standing up from a chair using your arms (e.g., wheelchair or bedside chair)?: A Little Help needed to walk in hospital room?: A Little Help needed climbing 3-5 steps with a railing? : A Lot 6 Click Score: 17    End of Session   Activity Tolerance: Patient tolerated treatment well;Patient limited by fatigue Patient left: in chair;with call bell/phone within reach Nurse Communication: Mobility status PT Visit Diagnosis: Unsteadiness on feet (R26.81);Other abnormalities of gait and mobility (R26.89);Muscle weakness (generalized) (M62.81)    Time: 0814-4818 PT Time Calculation (min) (ACUTE ONLY): 31 min   Charges:   PT Evaluation $PT Eval Moderate Complexity: 1 Mod PT Treatments $Therapeutic Activity: 23-37 mins        2:13 PM, 05/21/22 Lonell Grandchild, MPT Physical Therapist with [facility] [phone] office [phone] mobile phone

## 2022-05-21 NOTE — Progress Notes (Addendum)
PROGRESS NOTE  Tim Henry NWG:[id number] DOB: [birth year] DOA: 05/19/2022 PCP: Josem Kaufmann, MD  HPI/Recap of past 24 hours: Tim Henry  is a 89 y.o. male, with medical history significant of hypertension, hyperlipidemia T2DM, GERD, BPH. -Patient was brought from home via EMS, he tested positive for COVID-19 the day prior.  Associated with nausea, vomiting and diarrhea.  EMS was called due to lethargy.  When they arrived, he was hypotensive, and had a witnessed syncopal episode.  At baseline he ambulates with a walker. -In ED patient was hypotensive requiring vasopressor support.  Chest x-ray significant for vascular congestion, CT abdomen pelvis significant for colitis, labs significant with elevated creatinine at 2.3 about baseline, elevated lactic acid at 3.2, blood cultures were sent, UA is pending, [facility] hospitalist consulted to admit.  Hospital course complicated by mild delirium on 05/21/2022.  O2 requirement is improving, saturation 100% on 3 L.  Not on oxygen supplementation at baseline.  05/21/2022: The patient was seen and examined at his bedside.  There was confusion this morning.  Updated his daughter via phone.  Per his daughter, in the morning usually a little confused take some time to come around.  The patient has no new complaints.  Assessment/Plan: Principal Problem:   Septic shock (HCC) Active Problems:   Type 2 diabetes mellitus with complication (HCC)   GERD (gastroesophageal reflux disease)   Diabetes (HCC)   AKI (acute kidney injury) ([place])   COVID-19 virus infection  Septic shock, POA, resolved -septic shock present on admission, elevated lactic acid, hypotensive requiring vasopressor support.  Now off vasopressor, Levophed. -So far workup significant for colitis on imaging, no infection on chest x-ray -Continue with broad-spectrum antibiotic cefepime and Flagyl, down escalate when more culture data available BPs have been stable for the past 24 hours.   Leukocytosis has resolved.  Afebrile.   Covid 19 viral infection, POA Nausea and vomiting diarrhea likely secondary from COVID-19 viral infection Acute hypoxic respiratory failure secondary to COVID-19 viral infection Continue IV remdesivir.  Day number 3 out of 3. Not on oxygen supplementation at baseline. Wean off O2 supplementation. DuoNebs every 6 hours As needed antitussives, incentive spirometer, mobilize as tolerated.  Mild delirium, this morning Out of bed to chair during the day Regulate sleep and wake cycle   Improving, AKI, likely prerenal in the setting of dehydration from nausea vomiting and diarrhea Presented with creatinine of 2.40 Creatinine is downtrending, 1.4 with GFR of 44. Continue to avoid nephrotoxic agents, dehydration and hypotension. Monitor urine output.   Anemia of chronic kidney disease - at baseline, no indication for transfusion No overt bleeding reported.   Diabetes mellitus type 2 -Keep an insulin sliding scale and hold oral regimen    Hypertension, initially hypotensive in the setting of septic shock. -Continue to hold verapamil, Coreg and Avapro to above Continue to closely monitor vital signs.   Resolved bradycardia- received atropine -Likely worsened by verapamil and Coreg   Chronic anxiety and depression Stable Continue home regimen   BPH -Continue with Proscar and Flomax when blood pressure has improved Monitor urine output    GERD -Continue with PPI  Physical debility Seen by OT with recommendation for [facility] OT Continue fall precautions.     DVT Prophylaxis subcu Lovenox daily.   AM Labs Ordered, also please review Full Orders   Family Communication: Updated his daughter via phone on 05/21/2022.   Code Status Full   Likely DC to  home   Condition GUARDED  Consults called: none     Admission status: inpatient           Objective: Vitals:   05/21/22 0607 05/21/22 0747 05/21/22 0800 05/21/22 0824  BP: (!)  151/74     Pulse: 64     Resp: (!) 22     Temp:  97.6 F (36.4 C)    TempSrc:  Oral    SpO2: 99%  100% 100%  Weight:      Height:        Intake/Output Summary (Last 24 hours) at 05/21/2022 8527 Last data filed at 05/21/2022 0600 Gross per 24 hour  Intake 3029.82 ml  Output 2250 ml  Net 779.82 ml   Filed Weights   05/19/22 1802 05/19/22 2345 05/21/22 0500  Weight: 103.9 kg 102 kg 104.2 kg    Exam:  General: 89 y.o. year-old male without open nourished in no acute distress.  He is alert and mildly confused. Cardiovascular: Regular rate and rhythm no rubs or gallops. Respiratory: Mild rales at bases.  No wheezing noted.  Poor inspiratory effort. Abdomen: Soft noted normal bowel sounds present.  Musculoskeletal: No lower extremity edema bilaterally.   Skin: No ulcerative lesions noted or rashes, Psychiatry: Mood is appropriate for condition and setting.   Data Reviewed: CBC: Recent Labs  Lab 05/19/22 1833 05/19/22 1835 05/20/22 0411  WBC 11.1*  --  8.5  NEUTROABS 7.9*  --   --   HGB 10.1* 10.9* 10.7*  HCT 31.6* 32.0* 34.9*  MCV 103.6*  --  104.5*  PLT 129*  --  136*   Basic Metabolic Panel: Recent Labs  Lab 05/19/22 1833 05/19/22 1835 05/20/22 0411 05/21/22 0506  NA 133* 137 135 135  K 4.2 4.4 4.2 3.8  CL 100 101 105 104  CO2 25  --  21* 23  GLUCOSE 210* 203* 213* 205*  BUN 33* 29* 32* 34*  CREATININE 2.26* 2.40* 1.81* 1.48*  CALCIUM 8.1*  --  8.1* 8.1*  MG 1.7  --   --   --    GFR: Estimated Creatinine Clearance: 39.1 mL/min (A) (by C-G formula based on SCr of 1.48 mg/dL (H)). Liver Function Tests: Recent Labs  Lab 05/19/22 1833  AST 28  ALT 16  ALKPHOS 49  BILITOT 1.0  PROT 6.0*  ALBUMIN 3.0*   No results for input(s): "LIPASE", "AMYLASE" in the last 168 hours. No results for input(s): "AMMONIA" in the last 168 hours. Coagulation Profile: Recent Labs  Lab 05/19/22 1833 05/19/22 2240  INR 1.2 1.2   Cardiac Enzymes: No results for  input(s): "CKTOTAL", "CKMB", "CKMBINDEX", "TROPONINI" in the last 168 hours. BNP (last 3 results) No results for input(s): "PROBNP" in the last 8760 hours. HbA1C: No results for input(s): "HGBA1C" in the last 72 hours. CBG: Recent Labs  Lab 05/20/22 0733 05/20/22 1121 05/20/22 1639 05/20/22 2156 05/21/22 0744  GLUCAP 169* 206* 214* 259* 199*   Lipid Profile: No results for input(s): "CHOL", "HDL", "LDLCALC", "TRIG", "CHOLHDL", "LDLDIRECT" in the last 72 hours. Thyroid Function Tests: No results for input(s): "TSH", "T4TOTAL", "FREET4", "T3FREE", "THYROIDAB" in the last 72 hours. Anemia Panel: No results for input(s): "VITAMINB12", "FOLATE", "FERRITIN", "TIBC", "IRON", "RETICCTPCT" in the last 72 hours. Urine analysis:    Component Value Date/Time   COLORURINE YELLOW 05/19/2022 2300   APPEARANCEUR HAZY (A) 05/19/2022 2300   APPEARANCEUR Clear 04/05/2022 1101   LABSPEC 1.016 05/19/2022 2300   PHURINE 5.0 05/19/2022 2300   GLUCOSEU NEGATIVE 05/19/2022 2300  HGBUR SMALL (A) 05/19/2022 2300   BILIRUBINUR NEGATIVE 05/19/2022 2300   BILIRUBINUR Negative 04/05/2022 1101   KETONESUR 5 (A) 05/19/2022 2300   PROTEINUR 30 (A) 05/19/2022 2300   NITRITE NEGATIVE 05/19/2022 2300   LEUKOCYTESUR NEGATIVE 05/19/2022 2300   Sepsis Labs: @LABRCNTIP (procalcitonin:4,lacticidven:4)  ) Recent Results (from the past 240 hour(s))  Resp panel by RT-PCR (RSV, Flu A&B, Covid) Anterior Nasal Swab     Status: Abnormal   Collection Time: 05/19/22  6:27 PM   Specimen: Anterior Nasal Swab  Result Value Ref Range Status   SARS Coronavirus 2 by RT PCR POSITIVE (A) NEGATIVE Final    Comment: (NOTE) SARS-CoV-2 target nucleic acids are DETECTED.  The SARS-CoV-2 RNA is generally detectable in upper respiratory specimens during the acute phase of infection. Positive results are indicative of the presence of the identified virus, but do not rule out bacterial infection or co-infection with other  pathogens not detected by the test. Clinical correlation with patient history and other diagnostic information is necessary to determine patient infection status. The expected result is Negative.  Fact Sheet for Patients: EntrepreneurPulse.com.au  Fact Sheet for Healthcare Providers: IncredibleEmployment.be  This test is not yet approved or cleared by the [place] FDA and  has been authorized for detection and/or diagnosis of SARS-CoV-2 by FDA under an Emergency Use Authorization (EUA).  This EUA will remain in effect (meaning this test can be used) for the duration of  the COVID-19 declaration under Section 564(b)(1) of the A ct, 21 U.S.C. section 360bbb-3(b)(1), unless the authorization is terminated or revoked sooner.     Influenza A by PCR NEGATIVE NEGATIVE Final   Influenza B by PCR NEGATIVE NEGATIVE Final    Comment: (NOTE) The Xpert Xpress SARS-CoV-2/FLU/RSV plus assay is intended as an aid in the diagnosis of influenza from Nasopharyngeal swab specimens and should not be used as a sole basis for treatment. Nasal washings and aspirates are unacceptable for Xpert Xpress SARS-CoV-2/FLU/RSV testing.  Fact Sheet for Patients: EntrepreneurPulse.com.au  Fact Sheet for Healthcare Providers: IncredibleEmployment.be  This test is not yet approved or cleared by the [place] FDA and has been authorized for detection and/or diagnosis of SARS-CoV-2 by FDA under an Emergency Use Authorization (EUA). This EUA will remain in effect (meaning this test can be used) for the duration of the COVID-19 declaration under Section 564(b)(1) of the Act, 21 U.S.C. section 360bbb-3(b)(1), unless the authorization is terminated or revoked.     Resp Syncytial Virus by PCR NEGATIVE NEGATIVE Final    Comment: (NOTE) Fact Sheet for Patients: EntrepreneurPulse.com.au  Fact Sheet for Healthcare  Providers: IncredibleEmployment.be  This test is not yet approved or cleared by the [place] FDA and has been authorized for detection and/or diagnosis of SARS-CoV-2 by FDA under an Emergency Use Authorization (EUA). This EUA will remain in effect (meaning this test can be used) for the duration of the COVID-19 declaration under Section 564(b)(1) of the Act, 21 U.S.C. section 360bbb-3(b)(1), unless the authorization is terminated or revoked.  Performed at [facility], [address], [place], [place] [address]   Blood Culture (routine x 2)     Status: None (Preliminary result)   Collection Time: 05/19/22  6:33 PM   Specimen: Site Not Specified; Blood  Result Value Ref Range Status   Specimen Description   Final    SITE NOT SPECIFIED BOTTLES DRAWN AEROBIC AND ANAEROBIC   Special Requests Blood Culture adequate volume  Final   Culture   Final  NO GROWTH 2 DAYS Performed at [facility], [address], [place], [place] [address]    Report Status PENDING  Incomplete  Blood Culture (routine x 2)     Status: None (Preliminary result)   Collection Time: 05/19/22  7:59 PM   Specimen: BLOOD RIGHT HAND  Result Value Ref Range Status   Specimen Description   Final    BLOOD RIGHT HAND BOTTLES DRAWN AEROBIC AND ANAEROBIC   Special Requests Blood Culture adequate volume  Final   Culture   Final    NO GROWTH 2 DAYS Performed at [facility], [address] [place], [place] [address]    Report Status PENDING  Incomplete  MRSA Next Gen by PCR, Nasal     Status: None   Collection Time: 05/19/22 11:00 PM   Specimen: Nasal Mucosa; Nasal Swab  Result Value Ref Range Status   MRSA by PCR Next Gen NOT DETECTED NOT DETECTED Final    Comment: (NOTE) The GeneXpert MRSA Assay (FDA approved for NASAL specimens only), is one component of a comprehensive MRSA colonization surveillance program. It is not intended to diagnose MRSA infection nor to guide or monitor treatment for  MRSA infections. Test performance is not FDA approved in patients less than 53 years old. Performed at [facility], [address] [place], [place] [address]       Studies: No results found.  Scheduled Meds:  ALPRAZolam  0.5 mg Oral BID   Chlorhexidine Gluconate Cloth  6 each Topical Q0600   dorzolamide-timolol  1 drop Both Eyes BID   heparin  5,000 Units Subcutaneous Q8H   insulin aspart  0-5 Units Subcutaneous QHS   insulin aspart  0-9 Units Subcutaneous TID WC   ipratropium-albuterol  3 mL Nebulization BID   latanoprost  1 drop Both Eyes QHS   methylPREDNISolone (SOLU-MEDROL) injection  40 mg Intravenous Daily   pantoprazole  40 mg Oral Daily   sertraline  100 mg Oral Daily    Continuous Infusions:  ceFEPime (MAXIPIME) IV     metronidazole 500 mg (05/21/22 0800)   remdesivir 100 mg in sodium chloride 0.9 % 100 mL IVPB Stopped (05/20/22 1806)     LOS: 2 days     Kayleen Memos, MD [facility] Pager [phone]  If 7PM-7AM, please contact night-coverage [email or web address] Password [facility] 05/21/2022, 9:38 AM

## 2022-05-21 NOTE — TOC Initial Note (Signed)
Transition of Care [facility]) - Initial/Assessment Note    Patient Details  Name: Tim Henry MRN: [id number] Date of Birth: 02/13/1928  Transition of Care [facility]) CM/SW Contact:    Salome Arnt, [place] Phone Number: 05/21/2022, 9:23 AM  Clinical Narrative:  Assessment completed due to high risk admission score. LCSW completed assessment with pt's daughter, Melody as pt oriented to self only per chart. Pt lives with daughter. He is fairly independent with ADLs at baseline and ambulates with a walker. Pt's daughter provides transportation to appointments. Daughter plans on pt returning home when medically stable. PT evaluation pending. TOC will continue to follow.                    Barriers to Discharge: Continued Medical Work up   Patient Goals and CMS Choice Patient states their goals for this hospitalization and ongoing recovery are:: return home     [place] ownership interest in [facility].provided to::  (n/a)    Expected Discharge Plan and Services In-house Referral: Clinical Social Work     Living arrangements for the past 2 months: Single Family Home                                      Prior Living Arrangements/Services Living arrangements for the past 2 months: Single Family Home Lives with:: Adult Children Patient language and need for interpreter reviewed:: Yes Do you feel safe going back to the place where you live?: Yes      Need for Family Participation in Patient Care: Yes (Comment) Care giver support system in place?: Yes (comment) Current home services: DME (walker) Criminal Activity/Legal Involvement Pertinent to Current Situation/Hospitalization: No - Comment as needed  Activities of Daily Living Home Assistive Devices/Equipment: Walker (specify type) ADL Screening (condition at time of admission) Patient's cognitive ability adequate to safely complete daily activities?: No Is the patient deaf or have difficulty hearing?:  Yes ([place]) Does the patient have difficulty seeing, even when wearing glasses/contacts?: Yes (per pt, he has prev eye sx) Does the patient have difficulty concentrating, remembering, or making decisions?: Yes (confused during admission) Patient able to express need for assistance with ADLs?: Yes Does the patient have difficulty dressing or bathing?: Yes Independently performs ADLs?: No Communication: Needs assistance Is this a change from baseline?: Change from baseline, expected to last <3 days Dressing (OT): Needs assistance Is this a change from baseline?: Change from baseline, expected to last <3days Grooming: Needs assistance Is this a change from baseline?: Change from baseline, expected to last <3 days Feeding: Needs assistance Is this a change from baseline?: Change from baseline, expected to last <3 days Bathing: Needs assistance Is this a change from baseline?: Change from baseline, expected to last <3 days Toileting: Needs assistance Is this a change from baseline?: Change from baseline, expected to last <3 days In/Out Bed: Needs assistance Is this a change from baseline?: Change from baseline, expected to last <3 days Walks in Home: Needs assistance Is this a change from baseline?: Change from baseline, expected to last <3 days Does the patient have difficulty walking or climbing stairs?: Yes Weakness of Legs: Both Weakness of Arms/Hands: None  Permission Sought/Granted                  Emotional Assessment   Attitude/Demeanor/Rapport: Unable to Assess Affect (typically observed): Unable to Assess Orientation: : Oriented to  Self Alcohol / Substance Use: Not Applicable Psych Involvement: No (comment)  Admission diagnosis:  Shock ([place]) [R57.9] Septic shock ([place]) [A41.9, R65.21] Diarrhea, unspecified type [R19.7] COVID-19 [U07.1] Patient Active Problem List   Diagnosis Date Noted   COVID-19 virus infection 05/19/2022   Septic shock ([place]) 05/19/2022   UTI  (urinary tract infection) 03/04/2022   AKI (acute kidney injury) ([place]) 03/04/2022   Type 2 diabetes mellitus with hyperglycemia ([place]) 03/04/2022   Dehydration 03/04/2022   Hypoalbuminemia due to protein-calorie malnutrition ([place]) 03/04/2022   Chronic venous stasis 03/04/2022   Obesity (BMI 30-39.9) 03/04/2022   Generalized weakness 03/03/2022   Left hydrocele 04/13/2020   Anxiety 12/18/2019   Arthritis of knee 12/18/2019   At maximum risk for fall 12/18/2019   Depression 12/18/2019   Dry cough 12/18/2019   Edema 12/18/2019   GERD (gastroesophageal reflux disease) 12/18/2019   Heart failure ([place]) 12/18/2019   Medicare annual wellness visit, subsequent 12/18/2019   Pleural effusion 12/18/2019   Hypertension 12/18/2019   Neck pain 12/18/2019   History of BPH 12/18/2019   Subdural hematoma ([place]) 12/18/2019   Diabetes ([place]) 12/18/2019   Varicose veins of bilateral lower extremities with other complications 42/35/3614   Edema of lower extremity 09/24/2019   Ankle ulcer ([place]) 12/22/2018   Varicose veins of lower extremities with ulcer ([place]) 05/30/2018   Numbness 05/13/2018   Diabetic peripheral neuropathy ([place]) 04/15/2018   Hallux valgus 04/15/2018   Onychomycosis 04/15/2018   Tibialis posterior tendinitis 04/15/2018   Ulcers of both lower extremities, limited to breakdown of skin ([place]) 12/15/2017   Lymphedema 04/10/2017   Bilateral lower extremity edema 03/01/2017   Lower extremity pain, bilateral 03/01/2017   Cellulitis of lower extremity 03/01/2017   Essential hypertension 03/01/2017   Hyperlipidemia 03/01/2017   Type 2 diabetes mellitus with complication ([place]) 43/15/4008   Headache disorder 02/13/2017   Loss of memory 02/13/2017   Unstable gait 02/13/2017   Borderline diabetes 02/07/2017   Microhematuria 11/19/2015   Hx of hydrocele 01/25/2014   Impaired glucose tolerance 01/25/2014   Indigestion 01/25/2014   Arthrosis of hip 09/11/2013   Pain of left femur 07/09/2013    SDH (subdural hematoma) ([place]) 07/07/2013   Seizures ([place]) 07/07/2013   ED (erectile dysfunction) of organic origin 03/24/2012   BPH (benign prostatic hyperplasia) 03/24/2012   PCP:  Josem Kaufmann, MD Pharmacy:   [place], [place] - [address] [place] [address] Phone: [phone] Fax: [phone]     Social Determinants of Health (SDOH) Social History: SDOH Screenings   Food Insecurity: No Food Insecurity (05/21/2022)  Housing: Low Risk  (05/21/2022)  Transportation Needs: No Transportation Needs (05/21/2022)  Utilities: Not At Risk (05/21/2022)  Tobacco Use: Medium Risk (05/20/2022)   SDOH Interventions:     Readmission Risk Interventions    05/21/2022    9:20 AM 03/05/2022    3:24 PM  Readmission Risk Prevention Plan  Transportation Screening Complete Complete  PCP or Specialist Appt within 5-7 Days  Complete  Home Care Screening  Complete  Medication Review (RN CM)  Complete  HRI or Home Care Consult Complete   Social Work Consult for [place] Planning/Counseling Complete   Palliative Care Screening Not Applicable   Medication Review Press photographer) Complete

## 2022-05-21 NOTE — TOC Progression Note (Signed)
Transition of Care [facility]) - Progression Note    Patient Details  Name: Tim Henry MRN: [id number] Date of Birth: [birth year]  Transition of Care [facility]) CM/SW Contact  Salome Arnt, [place] Phone Number: 05/21/2022, 11:30 AM  Clinical Narrative:  PT/OT recommending home health. Discussed with pt's daughter who reports they have used [facility] in the past and request again. Referred and accepted by [name] with [facility] for HHPT/OT. Will need orders.        Barriers to Discharge: Continued Medical Work up  Expected Discharge Plan and Services In-house Referral: Clinical Social Work     Living arrangements for the past 2 months: [place]: PT, OT [place] Agency: [place] Date [place]: 05/21/22 Time [place]: 1128 Representative spoke with at [place]: [place] Determinants of Health ([place]) Interventions [place]: No Food Insecurity (05/21/2022)  Housing: Low Risk  (05/21/2022)  Transportation Needs: No Transportation Needs (05/21/2022)  Utilities: Not At Risk (05/21/2022)  Tobacco Use: Medium Risk (05/20/2022)    Readmission Risk Interventions    05/21/2022    9:20 AM 03/05/2022    3:24 PM  Readmission Risk Prevention Plan  Transportation Screening Complete Complete  PCP or Specialist Appt within 5-7 Days  Complete  Home Care Screening  Complete  Medication Review (RN CM)  Complete  HRI or Home Care Consult Complete   Social Work Consult for [place] Planning/Counseling Complete   Palliative Care Screening Not Applicable   Medication Review Press photographer) Complete

## 2022-05-21 NOTE — Plan of Care (Signed)
Discussed with patient plan of care for the evening, pain management and bath tonight with some teach back displayed.  Problem: Education: Goal: Knowledge of risk factors and measures for prevention of condition will improve Outcome: Progressing   Problem: Coping: Goal: Psychosocial and spiritual needs will be supported Outcome: Progressing

## 2022-05-21 NOTE — Plan of Care (Signed)
  Problem: Acute Rehab PT Goals(only PT should resolve) Goal: Pt Will Go Supine/Side To Sit 05/21/2022 1415 by Lonell Grandchild, PT Outcome: Progressing Flowsheets (Taken 05/21/2022 1415) Pt will go Supine/Side to Sit: with supervision 05/21/2022 1414 by Lonell Grandchild, PT Outcome: Progressing Flowsheets (Taken 05/21/2022 1414) Pt will go Supine/Side to Sit:  with supervision  with modified independence Goal: Patient Will Transfer Sit To/From Stand 05/21/2022 1415 by Lonell Grandchild, PT Outcome: Progressing Flowsheets (Taken 05/21/2022 1415) Patient will transfer sit to/from stand: with min guard assist 05/21/2022 1414 by Lonell Grandchild, PT Outcome: Progressing Flowsheets (Taken 05/21/2022 1414) Patient will transfer sit to/from stand: with min guard assist Goal: Pt Will Transfer Bed To Chair/Chair To Bed 05/21/2022 1415 by Lonell Grandchild, PT Outcome: Progressing Flowsheets (Taken 05/21/2022 1415) Pt will Transfer Bed to Chair/Chair to Bed: min guard assist 05/21/2022 1414 by Lonell Grandchild, PT Outcome: Progressing Flowsheets (Taken 05/21/2022 1414) Pt will Transfer Bed to Chair/Chair to Bed: min guard assist Goal: Pt Will Ambulate 05/21/2022 1415 by Lonell Grandchild, PT Outcome: Progressing Flowsheets (Taken 05/21/2022 1415) Pt will Ambulate:  50 feet  with min guard assist  with rolling walker 05/21/2022 1414 by Lonell Grandchild, PT Outcome: Progressing Flowsheets (Taken 05/21/2022 1414) Pt will Ambulate:  50 feet  with min guard assist  with supervision  with rolling walker   2:16 PM, 05/21/22 Lonell Grandchild, MPT Physical Therapist with [facility] [phone] office [phone] mobile phone

## 2022-05-21 NOTE — Evaluation (Signed)
Occupational Therapy Evaluation Patient Details Name: Tim Henry MRN: [id number] DOB: Aug 28, 1927 Today's Date: 05/21/2022   History of Present Illness 89 y.o. M admitted on 05/19/22 due to lethargy, weakness, and nausea/vomiting/diarrhea. Pt found to be Covid+ on 1/19.  PMH significant for hypertension, hyperlipidemia T2DM, GERD, BPH.   Clinical Impression   Pt admitted for concerns listed above. PTA pt reports that he was independent with all mobility and ADL's, and that his family assists with all IADL's, though he does try to help with the cleaning. At this time, pt presents with some weakness and balance deficits. He is requiring min assist for all functional mobility and transfers, as well as most ADL's. Recommending HHOT at this time to maximize independence and safety at home.       Recommendations for follow up therapy are one component of a multi-disciplinary discharge planning process, led by the attending physician.  Recommendations may be updated based on patient status, additional functional criteria and insurance authorization.   Follow Up Recommendations  Home health OT     Assistance Recommended at Discharge Intermittent Supervision/Assistance  Patient can return home with the following A little help with walking and/or transfers;A lot of help with bathing/dressing/bathroom;Assistance with cooking/housework;Direct supervision/assist for medications management;Direct supervision/assist for financial management;Help with stairs or ramp for entrance    Functional Status Assessment  Patient has had a recent decline in their functional status and demonstrates the ability to make significant improvements in function in a reasonable and predictable amount of time.  Equipment Recommendations  None recommended by OT    Recommendations for Other Services       Precautions / Restrictions Precautions Precautions: Fall Restrictions Weight Bearing Restrictions: No       Mobility Bed Mobility Overal bed mobility: Needs Assistance Bed Mobility: Supine to Sit     Supine to sit: HOB elevated, Min assist     General bed mobility comments: Pt sleeps in a hospital bed at home. This session he needed min A to fully rotate and pull up to sitting EOB    Transfers Overall transfer level: Needs assistance Equipment used: Rolling walker (2 wheels) Transfers: Sit to/from Stand Sit to Stand: Min assist           General transfer comment: Min A to power up and steady with assist to stabilize RW as pt was pushing/pulling up on RW      Balance Overall balance assessment: Needs assistance Sitting-balance support: No upper extremity supported, Feet supported Sitting balance-Leahy Scale: Fair     Standing balance support: Bilateral upper extremity supported, Reliant on assistive device for balance Standing balance-Leahy Scale: Poor                             ADL either performed or assessed with clinical judgement   ADL Overall ADL's : Needs assistance/impaired Eating/Feeding: Set up;Sitting   Grooming: Set up;Sitting   Upper Body Bathing: Set up;Sitting   Lower Body Bathing: Minimal assistance;Sitting/lateral leans;Sit to/from stand   Upper Body Dressing : Set up;Sitting   Lower Body Dressing: Minimal assistance;Sit to/from stand;Sitting/lateral leans   Toilet Transfer: Minimal assistance;Ambulation   Toileting- Clothing Manipulation and Hygiene: Minimal assistance;Sit to/from stand;Sitting/lateral lean       Functional mobility during ADLs: Minimal assistance;Rolling walker (2 wheels) General ADL Comments: Pt overall a slow mover with balance deficits and concerns.     Vision Baseline Vision/History: 1 Wears glasses Ability to See in  Adequate Light: 0 Adequate Patient Visual Report: No change from baseline Vision Assessment?: No apparent visual deficits     Perception Perception Perception Tested?: No   Praxis  Praxis Praxis tested?: Not tested    Pertinent Vitals/Pain Pain Assessment Pain Assessment: No/denies pain     Hand Dominance Right   Extremity/Trunk Assessment Upper Extremity Assessment Upper Extremity Assessment: Generalized weakness   Lower Extremity Assessment Lower Extremity Assessment: Defer to PT evaluation   Cervical / Trunk Assessment Cervical / Trunk Assessment: Normal   Communication Communication Communication: No difficulties   Cognition Arousal/Alertness: Awake/alert Behavior During Therapy: WFL for tasks assessed/performed Overall Cognitive Status: History of cognitive impairments - at baseline                                 General Comments: Per chart pt typically only oriented to self.     General Comments  VSS on RA    Exercises     Shoulder Instructions      Home Living Family/patient expects to be discharged to:: Private residence Living Arrangements: Children Available Help at Discharge: Family;Available 24 hours/day Type of Home: House Home Access: Stairs to enter [employer] of Steps: 3 steps Entrance Stairs-Rails: Right Home Layout: One level     Bathroom Shower/Tub: Teacher, early years/pre: Standard Bathroom Accessibility: Yes How Accessible: Accessible via walker Home Equipment: [place] (2 wheels);Rollator (4 wheels);Shower seat;Grab bars - toilet;Grab bars - tub/shower          Prior Functioning/Environment Prior Level of Function : Independent/Modified Independent             Mobility Comments: Community ambulator w/ RW, does not drive ADLs Comments: Independent        OT Problem List: Decreased strength;Impaired balance (sitting and/or standing);Decreased activity tolerance;Decreased cognition;Decreased safety awareness      OT Treatment/Interventions: Self-care/ADL training;Therapeutic exercise;Energy conservation;DME and/or AE instruction;Therapeutic  activities;Patient/family education;Balance training    OT Goals(Current goals can be found in the care plan section) Acute Rehab OT Goals Patient Stated Goal: To go home OT Goal Formulation: With patient Time For Goal Achievement: 06/04/22 Potential to Achieve Goals: Good ADL Goals Pt Will Perform Grooming: with supervision;standing Pt Will Perform Lower Body Bathing: with supervision;sitting/lateral leans;sit to/from stand Pt Will Perform Lower Body Dressing: with supervision;sitting/lateral leans;sit to/from stand Pt Will Transfer to Toilet: with supervision;ambulating Pt Will Perform Toileting - Clothing Manipulation and hygiene: with supervision;sitting/lateral leans;sit to/from stand  OT Frequency: Min 1X/week    Co-evaluation              AM-PAC OT "6 Clicks" Daily Activity     Outcome Measure Help from another person eating meals?: A Little Help from another person taking care of personal grooming?: A Little Help from another person toileting, which includes using toliet, bedpan, or urinal?: A Little Help from another person bathing (including washing, rinsing, drying)?: A Little Help from another person to put on and taking off regular upper body clothing?: A Little Help from another person to put on and taking off regular lower body clothing?: A Little 6 Click Score: 18   End of Session Equipment Utilized During Treatment: Rolling walker (2 wheels) Nurse Communication: Mobility status  Activity Tolerance: Patient tolerated treatment well Patient left: in chair;with call bell/phone within reach;with chair alarm set  OT Visit Diagnosis: Unsteadiness on feet (R26.81);Other abnormalities of gait and mobility (R26.89);Muscle weakness (generalized) (M62.81)  Time: 9518-8416 OT Time Calculation (min): 24 min Charges:  OT General Charges $OT Visit: 1 Visit OT Evaluation $OT Eval Moderate Complexity: 1 Mod OT Treatments $Self Care/Home Management : 8-22  mins  Trish Mage, OTR/L [facility] Acute Rehab  Jazman Reuter Elane Bing Plume 05/21/2022, 11:13 AM

## 2022-05-21 NOTE — Inpatient Diabetes Management (Signed)
Inpatient Diabetes Program Recommendations  AACE/ADA: New Consensus Statement on Inpatient Glycemic Control   Target Ranges:  Prepandial:   less than 140 mg/dL      Peak postprandial:   less than 180 mg/dL (1-2 hours)      Critically ill patients:  140 - 180 mg/dL    Latest Reference Range & Units 05/20/22 07:33 05/20/22 11:21 05/20/22 16:39 05/20/22 21:56 05/21/22 07:44  Glucose-Capillary 70 - 99 mg/dL 169 (H) 206 (H) 214 (H) 259 (H) 199 (H)   Review of Glycemic Control  Diabetes history: DM2 Outpatient Diabetes medications: Metformin 500 mg BID, Januvia 100 mg daily Current orders for Inpatient glycemic control: Novolog 0-9 units TID, Novolog 0-5 units QHS; Solumedrol 40 mg daily  Inpatient Diabetes Program Recommendations:    Insulin: If steroids are continued, please consider ordering Novolog 3 units TID with meals for meal coverage if patient eats at least 50% of meals.  Thanks, Barnie Alderman, RN, MSN, [place] Diabetes Coordinator Inpatient Diabetes Program [phone] (Team Pager from 8am to [place])

## 2022-05-22 DIAGNOSIS — R6521 Severe sepsis with septic shock: Secondary | ICD-10-CM | POA: Diagnosis not present

## 2022-05-22 DIAGNOSIS — A419 Sepsis, unspecified organism: Secondary | ICD-10-CM | POA: Diagnosis not present

## 2022-05-22 LAB — CBC
HCT: 35.9 % — ABNORMAL LOW (ref 39.0–52.0)
Hemoglobin: 11.6 g/dL — ABNORMAL LOW (ref 13.0–17.0)
MCH: 32.3 pg (ref 26.0–34.0)
MCHC: 32.3 g/dL (ref 30.0–36.0)
MCV: 100 fL (ref 80.0–100.0)
Platelets: 177 10*3/uL (ref 150–400)
RBC: 3.59 MIL/uL — ABNORMAL LOW (ref 4.22–5.81)
RDW: 12.7 % (ref 11.5–15.5)
WBC: 16 10*3/uL — ABNORMAL HIGH (ref 4.0–10.5)
nRBC: 0.2 % (ref 0.0–0.2)

## 2022-05-22 LAB — BASIC METABOLIC PANEL
Anion gap: 8 (ref 5–15)
BUN: 36 mg/dL — ABNORMAL HIGH (ref 8–23)
CO2: 24 mmol/L (ref 22–32)
Calcium: 8.4 mg/dL — ABNORMAL LOW (ref 8.9–10.3)
Chloride: 105 mmol/L (ref 98–111)
Creatinine, Ser: 1.28 mg/dL — ABNORMAL HIGH (ref 0.61–1.24)
GFR, Estimated: 53 mL/min — ABNORMAL LOW (ref >60)
Glucose, Bld: 253 mg/dL — ABNORMAL HIGH (ref 70–99)
Potassium: 3.7 mmol/L (ref 3.5–5.1)
Sodium: 137 mmol/L (ref 135–145)

## 2022-05-22 LAB — MAGNESIUM: Magnesium: 2 mg/dL (ref 1.7–2.4)

## 2022-05-22 LAB — GLUCOSE, CAPILLARY: Glucose-Capillary: 231 mg/dL — ABNORMAL HIGH (ref 70–99)

## 2022-05-22 MED ORDER — METRONIDAZOLE 500 MG PO TABS: 500.0000 mg | ORAL_TABLET | Freq: Two times a day (BID) | ORAL | 0 refills | Status: AC

## 2022-05-22 MED ORDER — HYDRALAZINE HCL 20 MG/ML IJ SOLN
10.0000 mg | Freq: Once | INTRAMUSCULAR | Status: AC
  Administered 2022-05-22: 10 mg via INTRAVENOUS
  Filled 2022-05-22: qty 1

## 2022-05-22 NOTE — Discharge Summary (Signed)
Physician Discharge Summary  Tim Henry ZOX:[id number] DOB: [birth year] DOA: 05/19/2022  PCP: Tim BossSettle, Paul C, MD  Admit date: 05/19/2022  Discharge date: 05/22/2022  Admitted From:Home  Disposition:  Home  Recommendations for Outpatient Follow-up:  Follow up with PCP in 1-2 weeks Remain on Flagyl for 6 more days for treatment of colitis for total 10-day course of treatment as prescribed Continue other home medications as prior  Home Health: Yes with PT/OT  Equipment/Devices: None  Discharge Condition:Stable  CODE STATUS: Full  Diet recommendation: Heart Healthy/carb modified  Brief/Interim Summary: Per HPI: Tim Henry, with medical history significant of hypertension, hyperlipidemia T2DM, GERD, BPH. -Patient was brought from home via EMS, he tested positive for COVID-19 the day prior.  Associated with nausea, vomiting and diarrhea.  EMS was called due to lethargy.  When they arrived, he was hypotensive, and had a witnessed syncopal episode.  At baseline he ambulates with a walker. -In ED patient was hypotensive requiring vasopressor support.  Chest x-ray significant for vascular congestion, CT abdomen pelvis significant for colitis, labs significant with elevated creatinine at 2.3 about baseline, elevated lactic acid at 3.2, blood cultures were sent, UA is pending, [facility] hospitalist consulted to admit.  Patient was admitted for septic shock requiring pressor support in the setting of COVID-19 viral infection as well as suspicion for colitis noted on CT imaging.  He was noted to have some associated AKI and this had resolved with administration of IV fluids.  He is now tolerating diet with no further diarrhea and had received 3-day course of IV remdesivir.  He was also on cefepime and Flagyl empirically for the treatment of colitis and is now off vasopressor support with elevated blood pressure readings noted.  He was initially requiring some oxygen as well and  this has been weaned off.  During the course of his stay he did have some mild delirium which has also resolved and he is back to his baseline level of functioning.  He is now in stable condition for discharge home with home PT/OT services as determined by physical therapy evaluation.  Discharge Diagnoses:  Principal Problem:   Septic shock (HCC) Active Problems:   Type 2 diabetes mellitus with complication (HCC)   GERD (gastroesophageal reflux disease)   Diabetes (HCC)   AKI (acute kidney injury) (HCC)   COVID-19 virus infection  Principal discharge diagnosis: Septic shock in the setting of colitis as well as COVID-19 viral infection along with acute hypoxemic respiratory failure and AKI-resolved.  Discharge Instructions  Discharge Instructions     Diet - low sodium heart healthy   Complete by: As directed    Increase activity slowly   Complete by: As directed       Allergies as of 05/22/2022   No Known Allergies      Medication List     STOP taking these medications    Paxlovid (150/100) 10 x 150 MG & 10 x 100MG  Tbpk Generic drug: nirmatrelvir & ritonavir       TAKE these medications    acetaminophen 500 MG tablet Commonly known as: TYLENOL Take 500 mg by mouth 2 (two) times daily as needed for fever, headache, moderate pain or mild pain.   albuterol 108 (90 Base) MCG/ACT inhaler Commonly known as: VENTOLIN HFA Inhale 2 puffs into the lungs every 6 (six) hours as needed for wheezing or shortness of breath.   ALPRAZolam 0.5 MG tablet Commonly known as: XANAX Take 0.5 mg  by mouth 2 (two) times daily.   aspirin EC 81 MG tablet Take 1 tablet (81 mg total) by mouth daily with breakfast. Swallow whole. What changed: additional instructions   BLACK ELDERBERRY PO Take 1 capsule by mouth at bedtime.   carvedilol 6.25 MG tablet Commonly known as: COREG Take 6.25 mg by mouth 2 (two) times daily with a meal.   dextromethorphan-guaiFENesin 30-600 MG 12hr  tablet Commonly known as: MUCINEX DM Take 1 tablet by mouth 2 (two) times daily.   diphenhydrAMINE 25 MG tablet Commonly known as: BENADRYL Take 25 mg by mouth at bedtime.   dorzolamide-timolol 2-0.5 % ophthalmic solution Commonly known as: COSOPT Place 1 drop into both eyes 2 (two) times daily.   ferrous sulfate 325 (65 FE) MG tablet Take 325 mg by mouth at bedtime.   finasteride 5 MG tablet Commonly known as: PROSCAR Take 1 tablet (5 mg total) by mouth daily.   furosemide 20 MG tablet Commonly known as: LASIX Take 1 tablet (20 mg total) by mouth daily.   gabapentin 300 MG capsule Commonly known as: NEURONTIN Take 300 mg by mouth 2 (two) times daily with a meal. 1300, 2300   GLUCOSAMINE PO Take 2 tablets by mouth at bedtime.   latanoprost 0.005 % ophthalmic solution Commonly known as: XALATAN Place 1 drop into both eyes at bedtime.   metFORMIN 500 MG tablet Commonly known as: GLUCOPHAGE Take 1 tablet (500 mg total) by mouth 2 (two) times daily with a meal. 1 PM, 11 PM What changed: additional instructions   metroNIDAZOLE 500 MG tablet Commonly known as: Flagyl Take 1 tablet (500 mg total) by mouth 2 (two) times daily for 6 days.   multivitamin with minerals tablet Take 1 tablet by mouth at bedtime.   OIL OF OREGANO PO Take 1 capsule by mouth at bedtime.   omeprazole 40 MG capsule Commonly known as: PRILOSEC Take 40 mg by mouth daily.   PHOSPHATIDYL CHOLINE PO Take 2 tablets by mouth at bedtime.   potassium chloride 10 MEQ tablet Commonly known as: KLOR-CON Take 20 mEq by mouth daily.   PROBIOTIC DAILY PO Take 1 capsule by mouth at bedtime.   sertraline 100 MG tablet Commonly known as: ZOLOFT Take 100 mg by mouth daily.   simvastatin 10 MG tablet Commonly known as: ZOCOR Take 10 mg by mouth at bedtime.   sitaGLIPtin 100 MG tablet Commonly known as: JANUVIA Take 100 mg by mouth daily.   SPIRULINA PO Take 1 tablet by mouth at bedtime.    sucralfate 1 g tablet Commonly known as: CARAFATE Take 1 g by mouth 2 (two) times daily.   tamsulosin 0.4 MG Caps capsule Commonly known as: FLOMAX Take 1 capsule (0.4 mg total) by mouth daily after breakfast. 1 PM What changed: additional instructions   telmisartan 40 MG tablet Commonly known as: MICARDIS Take 20 mg by mouth daily.   verapamil 240 MG CR tablet Commonly known as: CALAN-SR Take 240 mg by mouth daily.   Vitamin D (Ergocalciferol) 1.25 MG (50000 UNIT) Caps capsule Commonly known as: DRISDOL Take 50,000 Units by mouth every Wednesday.        Follow-up Information     [facility] Follow up.   Specialty: Home Health Services Why: Will contact you to schedule home health visits. Contact information: [address] [place] [address] [phone]         Tim Boss, MD. Schedule an appointment as soon as possible  for a visit in 1 week(s).   Specialty: Family Medicine Contact information: [address] [place] [address] [phone]                No Known Allergies  Consultations: None   Procedures/Studies: CT CHEST ABDOMEN PELVIS WO CONTRAST  Result Date: 05/19/2022 CLINICAL DATA:  Sepsis, tested positive for COVID-19 yesterday, nausea, vomiting, diarrhea, hypoxemia EXAM: CT CHEST, ABDOMEN AND PELVIS WITHOUT CONTRAST TECHNIQUE: Multidetector CT imaging of the chest, abdomen and pelvis was performed following the standard protocol without IV contrast. RADIATION DOSE REDUCTION: This exam was performed according to the departmental dose-optimization program which includes automated exposure control, adjustment of the mA and/or kV according to patient size and/or use of iterative reconstruction technique. COMPARISON:  03/03/2022 CT abdomen and pelvis FINDINGS: CT CHEST FINDINGS Cardiovascular: Atherosclerotic calcifications aorta, proximal great vessels, and coronary arteries. Aneurysmal dilatation ascending  thoracic aorta 4.1 cm transverse image 25. Heart size normal. No pericardial effusion. Mediastinum/Nodes: Question small hiatal hernia. Esophagus normal. Base of cervical region normal appearance. No thoracic adenopathy. Lungs/Pleura: Peribronchial thickening. Dependent atelectasis BILATERAL lower lobes, less in upper lobes. No definite infiltrate, pleural effusion, or pneumothorax. Musculoskeletal: No acute osseous findings. CT ABDOMEN PELVIS FINDINGS Hepatobiliary: 2.3 cm diameter gallstone within gallbladder. Gallbladder and liver otherwise normal appearance. Pancreas: Normal appearance Spleen: Normal appearance Adrenals/Urinary Tract: Adrenal glands normal appearance. BILATERAL renal cysts unchanged, largest 2.2 cm diameter anterior LEFT kidney image 54; no follow-up imaging recommended. No hydronephrosis, hydroureter or urinary tract calcification. Bladder unremarkable Stomach/Bowel: Diffuse colonic diverticulosis without evidence of diverticulitis. Wall thickening of the ascending colon through hepatic flexure of colon question colitis. Stomach unremarkable. Mild fecalization of small bowel contents distally though no point of obstruction, bowel wall thickening, or small bowel dilatation is identified. Vascular/Lymphatic: Tortuous abdominal aorta with scattered atherosclerotic calcifications. Aorta normal caliber. Additional atherosclerotic calcifications within iliac arteries. No adenopathy. Reproductive: Minimal prostatic enlargement for age Other: No free air or free fluid. RIGHT inguinal and small umbilical hernias containing fat. Musculoskeletal: Degenerative changes lumbar spine and LEFT hip joint IMPRESSION: Bibasilar atelectasis without definite infiltrate. Diffuse colonic diverticulosis without evidence of diverticulitis. Wall thickening of the ascending colon through hepatic flexure of colon question colitis; this could be due to infection, inflammatory bowel disease, or less likely ischemia.  Cholelithiasis. RIGHT inguinal and small umbilical hernias containing fat. Aneurysmal dilatation of ascending thoracic aorta 4.1 cm transverse, recommendation below. Recommend annual imaging followup by CTA or MRA. This recommendation follows 2010 ACCF/AHA/AATS/ACR/ASA/SCA/SCAI/SIR/STS/SVM Guidelines for the Diagnosis and Management of Patients with Thoracic Aortic Disease. Circulation. 2010; 121: B147-W295. Aortic aneurysm NOS (ICD10-I71.9) Aortic Atherosclerosis (ICD10-I70.0). Aortic aneurysm NOS (ICD10-I71.9). Electronically Signed   By: Lavonia Dana M.D.   On: 05/19/2022 21:12   CT Head Wo Contrast  Result Date: 05/19/2022 CLINICAL DATA:  Syncope EXAM: CT HEAD WITHOUT CONTRAST TECHNIQUE: Contiguous axial images were obtained from the base of the skull through the vertex without intravenous contrast. RADIATION DOSE REDUCTION: This exam was performed according to the departmental dose-optimization program which includes automated exposure control, adjustment of the mA and/or kV according to patient size and/or use of iterative reconstruction technique. COMPARISON:  March 03, 2022 FINDINGS: Brain: There is mild cerebral atrophy with widening of the extra-axial spaces and ventricular dilatation. There are areas of decreased attenuation within the white matter tracts of the supratentorial brain, consistent with microvascular disease changes. Vascular: No hyperdense vessel or unexpected calcification. Skull: Normal. Negative for fracture or focal lesion. Sinuses/Orbits: There is moderate to  marked severity bilateral ethmoid sinus mucosal thickening. Other: None. IMPRESSION: 1. No acute intracranial abnormality. 2. Generalized cerebral atrophy with widening of the extra-axial spaces and ventricular dilatation. 3. Moderate to marked severity bilateral ethmoid sinus disease. Electronically Signed   By: Virgina Norfolk M.D.   On: 05/19/2022 20:58   DG Chest Port 1 View  Result Date: 05/19/2022 CLINICAL DATA:   Questionable sepsis. EXAM: PORTABLE CHEST 1 VIEW COMPARISON:  03/03/2022 FINDINGS: 1824 hours. Low volume film. The cardio pericardial silhouette is enlarged. There is pulmonary vascular congestion without overt pulmonary edema. No focal airspace consolidation or overt pulmonary edema. No substantial pleural effusion. The visualized bony structures of the thorax are unremarkable. Telemetry leads overlie the chest. IMPRESSION: Low volume film with vascular congestion. Electronically Signed   By: Misty Stanley M.D.   On: 05/19/2022 18:41     Discharge Exam: Vitals:   05/22/22 0800 05/22/22 0822  BP:    Pulse:  (!) 59  Resp:  14  Temp:  (!) 97.3 F (36.3 Henry)  SpO2: 98% 98%   Vitals:   05/22/22 0100 05/22/22 0500 05/22/22 0800 05/22/22 0822  BP: (!) 189/78     Pulse: (!) 58   (!) 59  Resp: 16   14  Temp:    (!) 97.3 F (36.3 Henry)  TempSrc:    Oral  SpO2: 97%  98% 98%  Weight:  103.8 kg    Height:        General: Pt is alert, awake, not in acute distress Cardiovascular: RRR, S1/S2 +, no rubs, no gallops Respiratory: CTA bilaterally, no wheezing, no rhonchi Abdominal: Soft, NT, ND, bowel sounds + Extremities: no edema, no cyanosis    The results of significant diagnostics from this hospitalization (including imaging, microbiology, ancillary and laboratory) are listed below for reference.     Microbiology: Recent Results (from the past 240 hour(s))  Resp panel by RT-PCR (RSV, Flu A&B, Covid) Anterior Nasal Swab     Status: Abnormal   Collection Time: 05/19/22  6:27 PM   Specimen: Anterior Nasal Swab  Result Value Ref Range Status   SARS Coronavirus 2 by RT PCR POSITIVE (A) NEGATIVE Final    Comment: (NOTE) SARS-CoV-2 target nucleic acids are DETECTED.  The SARS-CoV-2 RNA is generally detectable in upper respiratory specimens during the acute phase of infection. Positive results are indicative of the presence of the identified virus, but do not rule out bacterial infection or  co-infection with other pathogens not detected by the test. Clinical correlation with patient history and other diagnostic information is necessary to determine patient infection status. The expected result is Negative.  Fact Sheet for Patients: EntrepreneurPulse.com.au  Fact Sheet for Healthcare Providers: IncredibleEmployment.be  This test is not yet approved or cleared by the [place] FDA and  has been authorized for detection and/or diagnosis of SARS-CoV-2 by FDA under an Emergency Use Authorization (EUA).  This EUA will remain in effect (meaning this test can be used) for the duration of  the COVID-19 declaration under Section 564(b)(1) of the A ct, 21 U.[place]. section 360bbb-3(b)(1), unless the authorization is terminated or revoked sooner.     Influenza A by PCR NEGATIVE NEGATIVE Final   Influenza B by PCR NEGATIVE NEGATIVE Final    Comment: (NOTE) The Xpert Xpress SARS-CoV-2/FLU/RSV plus assay is intended as an aid in the diagnosis of influenza from Nasopharyngeal swab specimens and should not be used as a sole basis for treatment. Nasal washings and aspirates are unacceptable  for Xpert Xpress SARS-CoV-2/FLU/RSV testing.  Fact Sheet for Patients: BloggerCourse.comhttps://www.fda.gov/media/152166/download  Fact Sheet for Healthcare Providers: SeriousBroker.ithttps://www.fda.gov/media/152162/download  This test is not yet approved or cleared by the [place] FDA and has been authorized for detection and/or diagnosis of SARS-CoV-2 by FDA under an Emergency Use Authorization (EUA). This EUA will remain in effect (meaning this test can be used) for the duration of the COVID-19 declaration under Section 564(b)(1) of the Act, 21 U.S.[place]. section 360bbb-3(b)(1), unless the authorization is terminated or revoked.     Resp Syncytial Virus by PCR NEGATIVE NEGATIVE Final    Comment: (NOTE) Fact Sheet for Patients: BloggerCourse.comhttps://www.fda.gov/media/152166/download  Fact  Sheet for Healthcare Providers: SeriousBroker.ithttps://www.fda.gov/media/152162/download  This test is not yet approved or cleared by the [place] FDA and has been authorized for detection and/or diagnosis of SARS-CoV-2 by FDA under an Emergency Use Authorization (EUA). This EUA will remain in effect (meaning this test can be used) for the duration of the COVID-19 declaration under Section 564(b)(1) of the Act, 21 [place]. section 360bbb-3(b)(1), unless the authorization is terminated or revoked.  Performed at [facility], [address] [place], [place] [address]   Blood Culture (routine x 2)     Status: None (Preliminary result)   Collection Time: 05/19/22  6:33 PM   Specimen: Site Not Specified; Blood  Result Value Ref Range Status   Specimen Description   Final    SITE NOT SPECIFIED BOTTLES DRAWN AEROBIC AND ANAEROBIC   Special Requests Blood Culture adequate volume  Final   Culture   Final    NO GROWTH 2 DAYS Performed at [facility], [address] [place], [place] [address]    Report Status PENDING  Incomplete  Blood Culture (routine x 2)     Status: None (Preliminary result)   Collection Time: 05/19/22  7:59 PM   Specimen: BLOOD RIGHT HAND  Result Value Ref Range Status   Specimen Description   Final    BLOOD RIGHT HAND BOTTLES DRAWN AEROBIC AND ANAEROBIC   Special Requests Blood Culture adequate volume  Final   Culture   Final    NO GROWTH 2 DAYS Performed at [facility], [address] [place], [place] [address]    Report Status PENDING  Incomplete  Urine Culture     Status: None   Collection Time: 05/19/22 11:00 PM   Specimen: In/Out Cath Urine  Result Value Ref Range Status   Specimen Description   Final    IN/OUT CATH URINE Performed at [facility], [address] [place], [place] [address]    Special Requests   Final    NONE Performed at [facility], [address] [place], [place] [address]    Culture   Final    NO GROWTH Performed at [facility], [address] [place], [place] [address]    Report Status 05/21/2022 FINAL  Final  MRSA Next Gen by PCR, Nasal     Status: None   Collection Time: 05/19/22 11:00 PM   Specimen: Nasal Mucosa; Nasal Swab  Result Value Ref Range Status   MRSA by PCR Next Gen NOT DETECTED NOT DETECTED Final    Comment: (NOTE) The GeneXpert MRSA Assay (FDA approved for NASAL specimens only), is one component of a comprehensive MRSA colonization surveillance program. It is not intended to diagnose MRSA infection nor to guide or monitor treatment for MRSA infections. Test performance is not FDA approved in patients less than 87 years old. Performed at [facility], [address], [place], [place] [address]      Labs: BNP (last 3 results) Recent Labs    05/20/22 0714  BNP 108.0*   Basic Metabolic Panel: Recent Labs  Lab 05/19/22 1833 05/19/22 1835 05/20/22 0411 05/21/22 0506 05/22/22 0837  NA 133* 137 135 135 137  K 4.2 4.4 4.2 3.8 3.7  CL 100 101 105 104 105  CO2 25  --  21* 23 24  GLUCOSE 210* 203* 213* 205* 253*  BUN 33* 29* 32* 34* 36*  CREATININE 2.26* 2.40* 1.81* 1.48* 1.28*  CALCIUM 8.1*  --  8.1* 8.1* 8.4*  MG 1.7  --   --   --  2.0   Liver Function Tests: Recent Labs  Lab 05/19/22 1833  AST 28  ALT 16  ALKPHOS 49  BILITOT 1.0  PROT 6.0*  ALBUMIN 3.0*   No results for input(s): "LIPASE", "AMYLASE" in the last 168 hours. No results for input(s): "AMMONIA" in the last 168 hours. CBC: Recent Labs  Lab 05/19/22 1833 05/19/22 1835 05/20/22 0411 05/22/22 0837  WBC 11.1*  --  8.5 16.0*  NEUTROABS 7.9*  --   --   --   HGB 10.1* 10.9* 10.7* 11.6*  HCT 31.6* 32.0* 34.9* 35.9*  MCV 103.6*  --  104.5* 100.0  PLT 129*  --  136* 177   Cardiac Enzymes: No results for input(s): "CKTOTAL", "CKMB", "CKMBINDEX", "TROPONINI" in the last 168 hours. BNP: Invalid input(s): "POCBNP" CBG: Recent Labs  Lab 05/21/22 0744 05/21/22 1126 05/21/22 1640 05/21/22 2246 05/22/22 0813   GLUCAP 199* 248* 310* 202* 231*   D-Dimer No results for input(s): "DDIMER" in the last 72 hours. Hgb A1c No results for input(s): "HGBA1C" in the last 72 hours. Lipid Profile No results for input(s): "CHOL", "HDL", "LDLCALC", "TRIG", "CHOLHDL", "LDLDIRECT" in the last 72 hours. Thyroid function studies No results for input(s): "TSH", "T4TOTAL", "T3FREE", "THYROIDAB" in the last 72 hours.  Invalid input(s): "FREET3" Anemia work up No results for input(s): "VITAMINB12", "FOLATE", "FERRITIN", "TIBC", "IRON", "RETICCTPCT" in the last 72 hours. Urinalysis    Component Value Date/Time   COLORURINE YELLOW 05/19/2022 2300   APPEARANCEUR HAZY (A) 05/19/2022 2300   APPEARANCEUR Clear 04/05/2022 1101   LABSPEC 1.016 05/19/2022 2300   PHURINE 5.0 05/19/2022 2300   GLUCOSEU NEGATIVE 05/19/2022 2300   HGBUR SMALL (A) 05/19/2022 2300   BILIRUBINUR NEGATIVE 05/19/2022 2300   BILIRUBINUR Negative 04/05/2022 1101   KETONESUR 5 (A) 05/19/2022 2300   PROTEINUR 30 (A) 05/19/2022 2300   NITRITE NEGATIVE 05/19/2022 2300   LEUKOCYTESUR NEGATIVE 05/19/2022 2300   Sepsis Labs Recent Labs  Lab 05/19/22 1833 05/20/22 0411 05/22/22 0837  WBC 11.1* 8.5 16.0*   Microbiology Recent Results (from the past 240 hour(s))  Resp panel by RT-PCR (RSV, Flu A&B, Covid) Anterior Nasal Swab     Status: Abnormal   Collection Time: 05/19/22  6:27 PM   Specimen: Anterior Nasal Swab  Result Value Ref Range Status   SARS Coronavirus 2 by RT PCR POSITIVE (A) NEGATIVE Final    Comment: (NOTE) SARS-CoV-2 target nucleic acids are DETECTED.  The SARS-CoV-2 RNA is generally detectable in upper respiratory specimens during the acute phase of infection. Positive results are indicative of the presence of the identified virus, but do not rule out bacterial infection or co-infection with other pathogens not detected by the test. Clinical correlation with patient history and other diagnostic information is necessary to  determine patient infection status. The expected result  is Negative.  Fact Sheet for Patients: BloggerCourse.com  Fact Sheet for Healthcare Providers: SeriousBroker.it  This test is not yet approved or cleared by the [place] FDA and  has been authorized for detection and/or diagnosis of SARS-CoV-2 by FDA under an Emergency Use Authorization (EUA).  This EUA will remain in effect (meaning this test can be used) for the duration of  the COVID-19 declaration under Section 564(b)(1) of the A ct, 21 U.[place]. section 360bbb-3(b)(1), unless the authorization is terminated or revoked sooner.     Influenza A by PCR NEGATIVE NEGATIVE Final   Influenza B by PCR NEGATIVE NEGATIVE Final    Comment: (NOTE) The Xpert Xpress SARS-CoV-2/FLU/RSV plus assay is intended as an aid in the diagnosis of influenza from Nasopharyngeal swab specimens and should not be used as a sole basis for treatment. Nasal washings and aspirates are unacceptable for Xpert Xpress SARS-CoV-2/FLU/RSV testing.  Fact Sheet for Patients: BloggerCourse.com  Fact Sheet for Healthcare Providers: SeriousBroker.it  This test is not yet approved or cleared by the [place] FDA and has been authorized for detection and/or diagnosis of SARS-CoV-2 by FDA under an Emergency Use Authorization (EUA). This EUA will remain in effect (meaning this test can be used) for the duration of the COVID-19 declaration under Section 564(b)(1) of the Act, 21 [place]. section 360bbb-3(b)(1), unless the authorization is terminated or revoked.     Resp Syncytial Virus by PCR NEGATIVE NEGATIVE Final    Comment: (NOTE) Fact Sheet for Patients: BloggerCourse.com  Fact Sheet for Healthcare Providers: SeriousBroker.it  This test is not yet approved or cleared by the [place] FDA and has  been authorized for detection and/or diagnosis of SARS-CoV-2 by FDA under an Emergency Use Authorization (EUA). This EUA will remain in effect (meaning this test can be used) for the duration of the COVID-19 declaration under Section 564(b)(1) of the Act, 21 [place]. section 360bbb-3(b)(1), unless the authorization is terminated or revoked.  Performed at [facility], [address], [place], [place] [address]   Blood Culture (routine x 2)     Status: None (Preliminary result)   Collection Time: 05/19/22  6:33 PM   Specimen: Site Not Specified; Blood  Result Value Ref Range Status   Specimen Description   Final    SITE NOT SPECIFIED BOTTLES DRAWN AEROBIC AND ANAEROBIC   Special Requests Blood Culture adequate volume  Final   Culture   Final    NO GROWTH 2 DAYS Performed at [facility], [address] [place], [place] [address]    Report Status PENDING  Incomplete  Blood Culture (routine x 2)     Status: None (Preliminary result)   Collection Time: 05/19/22  7:59 PM   Specimen: BLOOD RIGHT HAND  Result Value Ref Range Status   Specimen Description   Final    BLOOD RIGHT HAND BOTTLES DRAWN AEROBIC AND ANAEROBIC   Special Requests Blood Culture adequate volume  Final   Culture   Final    NO GROWTH 2 DAYS Performed at [facility], [address] [place], [place] [address]    Report Status PENDING  Incomplete  Urine Culture     Status: None   Collection Time: 05/19/22 11:00 PM   Specimen: In/Out Cath Urine  Result Value Ref Range Status   Specimen Description   Final    IN/OUT CATH URINE Performed at [facility], [address] [place], [place] [address]    Special Requests   Final    NONE  Performed at [facility], [address] [place], [place] [address]    Culture   Final    NO GROWTH Performed at [facility], [address] [place], [place] [address]    Report Status 05/21/2022 FINAL  Final  MRSA Next Gen by PCR, Nasal     Status: None   Collection Time:  05/19/22 11:00 PM   Specimen: Nasal Mucosa; Nasal Swab  Result Value Ref Range Status   MRSA by PCR Next Gen NOT DETECTED NOT DETECTED Final    Comment: (NOTE) The GeneXpert MRSA Assay (FDA approved for NASAL specimens only), is one component of a comprehensive MRSA colonization surveillance program. It is not intended to diagnose MRSA infection nor to guide or monitor treatment for MRSA infections. Test performance is not FDA approved in patients less than 87 years old. Performed at [facility], [address] [place], [place] [address]      Time coordinating discharge: 35 minutes  SIGNED:   Erick BlinksPratik D Mouhamed Glassco, DO [facility] 05/22/2022, 9:41 AM  If 7PM-7AM, please contact night-coverage [email or web address]

## 2022-05-22 NOTE — Progress Notes (Signed)
Pharmacy Antibiotic Note  Tim Henry is a 89 y.o. male admitted on 05/19/2022 with sepsis/colitis  Pharmacy has been consulted for cefepime dosing.    Patient was loaded with vancomycin last night, will plan on checking random vancomycin level in am and redose once level within therapeutic range.    Plan: Continue cefepime 2000 mg IV every 12 hours. Flagyl per MD    Height: 5\' 11"  (180.3 cm) Weight: 103.8 kg (228 lb 13.4 oz) IBW/kg (Calculated) : 75.3  Temp (24hrs), Avg:97.6 F (36.4 C), Min:97.3 F (36.3 C), Max:97.8 F (36.6 C)  Recent Labs  Lab 05/19/22 1833 05/19/22 1835 05/19/22 1959 05/19/22 2240 05/20/22 0055 05/20/22 0411 05/21/22 0506  WBC 11.1*  --   --   --   --  8.5  --   CREATININE 2.26* 2.40*  --   --   --  1.81* 1.48*  LATICACIDVEN 2.0*  --  3.2* 3.0* 1.1 1.4  --   VANCORANDOM  --   --   --   --   --   --  5     Estimated Creatinine Clearance: 39.1 mL/min (A) (by C-G formula based on SCr of 1.48 mg/dL (H)).    No Known Allergies  Antimicrobials this admission: Remdesivir x3d Vanc 1/20 Cefepime 1/20> Flagyl 1/20>  Micro:  1/20 urine: neg 1/20 bldx2: ngtd Covid+ MRSA-  Thank you for allowing pharmacy to be a part of this patient's care.  Margot Ables, PharmD Clinical Pharmacist 05/22/2022 8:44 AM

## 2022-05-22 NOTE — TOC Transition Note (Signed)
Transition of Care [facility] [place]) - CM/SW Discharge Note   Patient Details  Name: Tim Henry MRN: [id number] Date of Birth: 12/01/1927  Transition of Care [facility] CM/SW Contact:  [name], LCSW Phone Number: 05/22/2022, 9:39 AM   Clinical Narrative:     Pt stable for dc home with [facility] per MD. Updated [name] with [name] and they will follow pt at home.  No other TOC needs for dc.  Final next level of care: [place] Barriers to Discharge: Barriers Resolved   Patient Goals and CMS Choice      Discharge Placement                         Discharge Plan and Services Additional resources added to the After Visit Summary for   In-house Referral: Clinical Social Work                        HH Arranged: PT, OT [place] Agency: [place] Date [facility] Agency Contacted: 05/21/22 Time [place]: [phone] Representative spoke with at [place]: [place] Determinants of Health ([place]) Interventions [place]: No Food Insecurity (05/21/2022)  Housing: Low Risk  (05/21/2022)  Transportation Needs: No Transportation Needs (05/21/2022)  Utilities: Not At Risk (05/21/2022)  Tobacco Use: Medium Risk (05/20/2022)     Readmission Risk Interventions    05/21/2022    9:20 AM 03/05/2022    3:24 PM  Readmission Risk Prevention Plan  Transportation Screening Complete Complete  PCP or Specialist Appt within 5-7 Days  Complete  Home Care Screening  Complete  Medication Review (RN CM)  Complete  HRI or Home Care Consult Complete   Social Work Consult for [place] Planning/Counseling Complete   Palliative Care Screening Not Applicable   Medication Review Press photographer) Complete

## 2022-05-22 NOTE — Progress Notes (Signed)
Pt to be discharged home w/ home health/ PT. Son Barbaraann Rondo contacted for private transport. Michela Pitcher he would be able to come around 1130 to get his father.

## 2022-05-24 LAB — CULTURE, BLOOD (ROUTINE X 2)
Culture: NO GROWTH
Culture: NO GROWTH
Special Requests: ADEQUATE
Special Requests: ADEQUATE

## 2022-06-05 ENCOUNTER — Ambulatory Visit (INDEPENDENT_AMBULATORY_CARE_PROVIDER_SITE_OTHER): Payer: Medicare PPO | Admitting: Vascular Surgery

## 2022-06-08 ENCOUNTER — Ambulatory Visit (INDEPENDENT_AMBULATORY_CARE_PROVIDER_SITE_OTHER): Payer: Medicare PPO | Admitting: Vascular Surgery

## 2022-06-08 ENCOUNTER — Encounter (INDEPENDENT_AMBULATORY_CARE_PROVIDER_SITE_OTHER): Payer: Self-pay | Admitting: Vascular Surgery

## 2022-06-08 VITALS — BP 91/54 | HR 69 | Ht 71.0 in | Wt 225.0 lb

## 2022-06-08 DIAGNOSIS — E118 Type 2 diabetes mellitus with unspecified complications: Secondary | ICD-10-CM

## 2022-06-08 DIAGNOSIS — I890 Lymphedema, not elsewhere classified: Secondary | ICD-10-CM | POA: Diagnosis not present

## 2022-06-08 DIAGNOSIS — E785 Hyperlipidemia, unspecified: Secondary | ICD-10-CM

## 2022-06-08 DIAGNOSIS — I509 Heart failure, unspecified: Secondary | ICD-10-CM

## 2022-06-08 DIAGNOSIS — I10 Essential (primary) hypertension: Secondary | ICD-10-CM

## 2022-06-08 NOTE — Assessment & Plan Note (Signed)
His legs are doing quite well.  His family really has worked nicely with him and he has stayed diligent about elevation and exercise.  I will see him in 6 months.  Call with any questions or concerns in the interim.

## 2022-06-08 NOTE — Progress Notes (Signed)
MRN : [id number]  Tim Henry is a 89 y.o. (Feb 02, 1928) male who presents with chief complaint of  Chief Complaint  Patient presents with   Follow-up  .  History of Present Illness: Patient returns today in follow up of his lymphedema and lower extremity swelling.  His legs are doing quite well.  He recently got over COVID about 3 to 4 weeks ago.  He was quite sick from it but he is recovered quite nicely particular given his advanced age.  He is otherwise doing very well.  He is elevating his legs.  He is staying active and exercising regularly.  Current Outpatient Medications  Medication Sig Dispense Refill   acetaminophen (TYLENOL) 500 MG tablet Take 500 mg by mouth 2 (two) times daily as needed for fever, headache, moderate pain or mild pain.     albuterol (VENTOLIN HFA) 108 (90 Base) MCG/ACT inhaler Inhale 2 puffs into the lungs every 6 (six) hours as needed for wheezing or shortness of breath. 8 g 2   ALPRAZolam (XANAX) 0.5 MG tablet Take 0.5 mg by mouth 2 (two) times daily.     aspirin EC 81 MG tablet Take 1 tablet (81 mg total) by mouth daily with breakfast. Swallow whole. (Patient taking differently: Take 81 mg by mouth daily with breakfast.) 30 tablet 11   BLACK ELDERBERRY PO Take 1 capsule by mouth at bedtime.     carvedilol (COREG) 6.25 MG tablet Take 6.25 mg by mouth 2 (two) times daily with a meal.   10   dextromethorphan-guaiFENesin (MUCINEX DM) 30-600 MG 12hr tablet Take 1 tablet by mouth 2 (two) times daily. 20 tablet 0   diphenhydrAMINE (BENADRYL) 25 MG tablet Take 25 mg by mouth at bedtime.     dorzolamide-timolol (COSOPT) 22.3-6.8 MG/ML ophthalmic solution Place 1 drop into both eyes 2 (two) times daily.     ferrous sulfate 325 (65 FE) MG tablet Take 325 mg by mouth at bedtime.     finasteride (PROSCAR) 5 MG tablet Take 1 tablet (5 mg total) by mouth daily. 90 tablet 3   furosemide (LASIX) 20 MG tablet Take 1 tablet (20 mg total) by mouth daily. 30 tablet 1    gabapentin (NEURONTIN) 300 MG capsule Take 300 mg by mouth 2 (two) times daily with a meal. 1300, 2300     Glucosamine HCl (GLUCOSAMINE PO) Take 2 tablets by mouth at bedtime.     latanoprost (XALATAN) 0.005 % ophthalmic solution Place 1 drop into both eyes at bedtime.     metFORMIN (GLUCOPHAGE) 500 MG tablet Take 1 tablet (500 mg total) by mouth 2 (two) times daily with a meal. 1 PM, 11 PM (Patient taking differently: Take 500 mg by mouth 2 (two) times daily with a meal. 1300, 2300.) 60 tablet 3   Multiple Vitamins-Minerals (MULTIVITAMIN WITH MINERALS) tablet Take 1 tablet by mouth at bedtime.     OIL OF OREGANO PO Take 1 capsule by mouth at bedtime.     omeprazole (PRILOSEC) 40 MG capsule Take 40 mg by mouth daily.  5   PHOSPHATIDYL CHOLINE PO Take 2 tablets by mouth at bedtime.     potassium chloride (K-DUR) 10 MEQ tablet Take 20 mEq by mouth daily.     Probiotic Product (PROBIOTIC DAILY PO) Take 1 capsule by mouth at bedtime.     sertraline (ZOLOFT) 100 MG tablet Take 100 mg by mouth daily.     simvastatin (ZOCOR) 10 MG tablet Take 10 mg by  mouth at bedtime.     sitaGLIPtin (JANUVIA) 100 MG tablet Take 100 mg by mouth daily.     SPIRULINA PO Take 1 tablet by mouth at bedtime.     sucralfate (CARAFATE) 1 g tablet Take 1 g by mouth 2 (two) times daily.     tamsulosin (FLOMAX) 0.4 MG CAPS capsule Take 1 capsule (0.4 mg total) by mouth daily after breakfast. 1 PM (Patient taking differently: Take 0.4 mg by mouth daily after breakfast. 1300) 30 capsule 11   telmisartan (MICARDIS) 40 MG tablet Take 20 mg by mouth daily.  5   verapamil (CALAN-SR) 240 MG CR tablet Take 240 mg by mouth daily.     Vitamin D, Ergocalciferol, (DRISDOL) 1.25 MG (50000 UT) CAPS capsule Take 50,000 Units by mouth every Wednesday.  5   No current facility-administered medications for this visit.    Past Medical History:  Diagnosis Date   Anemia    in past   Anxiety    Dementia ([place])    able to sign own papers    Depression    Diabetes mellitus without complication ([place])    Full dentures    upper and lower   GERD (gastroesophageal reflux disease)    Head trauma    admitted to [facility], "pint of blood removed from head", no deficits after   Hypercholesteremia    Hypertension    Seizure after head injury ([place])    none since release from hosp    Past Surgical History:  Procedure Laterality Date   APPENDECTOMY     childhood   [place] Left 09/15/2014   Procedure: CATARACT EXTRACTION PHACO AND INTRAOCULAR LENS PLACEMENT ([place]);  Surgeon: Leandrew Koyanagi, MD;  Location: [facility];  Service: Ophthalmology;  Laterality: Left;  DIABETIC PT WOULD LIKE TO HAVE ARRIVAL TIME LATE AM   JOINT REPLACEMENT Left    knee   PHOTOCOAGULATION WITH LASER Right 01/23/2016   Procedure: PHOTOCOAGULATION WITH LASER;  Surgeon: Ronnell Freshwater, MD;  Location: [facility];  Service: Ophthalmology;  Laterality: Right;  DIABETIC - oral meds RIGHT Requests arrival 10AM or after     Social History   Tobacco Use   Smoking status: Former    Types: Cigarettes    Quit date: 04/30/1966    Years since quitting: 56.1   Smokeless tobacco: Never  Substance Use Topics   Alcohol use: No   Drug use: No      Family History  Problem Relation Age of Onset   Heart attack Mother    Diabetes Mother    Heart attack Father    Diabetes Sister      No Known Allergies   REVIEW OF SYSTEMS (Negative unless checked)   Constitutional: []$ Weight loss  []$ Fever  []$ Chills Cardiac: []$ Chest pain   []$ Chest pressure   []$ Palpitations   []$ Shortness of breath when laying flat   []$ Shortness of breath at rest   []$ Shortness of breath with exertion. Vascular:  []$ Pain in legs with walking   []$ Pain in legs at rest   []$ Pain in legs when laying flat   []$ Claudication   []$ Pain in feet when walking  []$ Pain in feet at rest  []$ Pain in feet when laying flat   []$ History of DVT   []$ Phlebitis    [x]$ Swelling in legs   []$ Varicose veins   [x]$ Non-healing ulcers Pulmonary:   []$ Uses home oxygen   []$ Productive cough   []$ Hemoptysis   []$ Wheeze  []$ COPD   []$   Asthma Neurologic:  []$ Dizziness  []$ Blackouts   []$ Seizures   []$ History of stroke   []$ History of TIA  []$ Aphasia   []$ Temporary blindness   []$ Dysphagia   []$ Weakness or numbness in arms   []$ Weakness or numbness in legs Musculoskeletal:  [x]$ Arthritis   []$ Joint swelling   []$ Joint pain   []$ Low back pain Hematologic:  []$ Easy bruising  []$ Easy bleeding   []$ Hypercoagulable state   []$ Anemic   Gastrointestinal:  []$ Blood in stool   []$ Vomiting blood  []$ Gastroesophageal reflux/heartburn   []$ Abdominal pain Genitourinary:  [x]$ Chronic kidney disease   []$ Difficult urination  []$ Frequent urination  []$ Burning with urination   []$ Hematuria Skin:  []$ Rashes   [x]$ Ulcers   [x]$ Wounds Psychological:  []$ History of anxiety   []$  History of major depression.  Physical Examination  BP (!) 91/54   Pulse 69   Ht 5' 11"$  (1.803 m)   Wt 225 lb (102.1 kg)   BMI 31.38 kg/m  Gen:  WD/WN, NAD. Appears younger than stated age. Head: [place]/AT, No temporalis wasting. Ear/Nose/Throat: Hearing grossly intact, nares w/o erythema or drainage Eyes: Conjunctiva clear. Sclera non-icteric Neck: Supple.  Trachea midline Pulmonary:  Good air movement, no use of accessory muscles.  Cardiac: irregular Vascular:  Vessel Right Left  Radial Palpable Palpable       Musculoskeletal: M/S 5/5 throughout.  No deformity or atrophy.  Marked stasis dermatitis changes with skin thickening is present bilaterally.  Significant hyperpigmentation.  Trace bilateral lower extremity edema. Neurologic: Sensation grossly intact in extremities.  Symmetrical.  Speech is fluent.  Psychiatric: Judgment intact, Mood & affect appropriate for pt's clinical situation. Dermatologic: No rashes or ulcers noted.  No cellulitis or open wounds.      Labs Recent Results (from the past 2160 hour(s))  Urinalysis, Complete      Status: None   Collection Time: 04/05/22 11:01 AM  Result Value Ref Range   Specific Gravity, UA 1.010 1.005 - 1.030   pH, UA 5.0 5.0 - 7.5   Color, UA Yellow Yellow   Appearance Ur Clear Clear   Leukocytes,UA Negative Negative   Protein,UA Negative Negative/Trace   Glucose, UA Negative Negative   Ketones, UA Negative Negative   RBC, UA Negative Negative   Bilirubin, UA Negative Negative   Urobilinogen, Ur 0.2 0.2 - 1.0 mg/dL   Nitrite, UA Negative Negative   Microscopic Examination See below:   Microscopic Examination     Status: Abnormal   Collection Time: 04/05/22 11:01 AM   Urine  Result Value Ref Range   WBC, UA 0-5 0 - 5 /hpf   RBC, Urine 0-2 0 - 2 /hpf   Epithelial Cells (non renal) 0-10 0 - 10 /hpf   Casts Present (A) None seen /lpf   Cast Type Hyaline casts N/A   Bacteria, UA Few None seen/Few  CBG monitoring, ED     Status: Abnormal   Collection Time: 05/19/22  6:14 PM  Result Value Ref Range   Glucose-Capillary 161 (H) 70 - 99 mg/dL    Comment: Glucose reference range applies only to samples taken after fasting for at least 8 hours.  Resp panel by RT-PCR (RSV, Flu A&B, Covid) Anterior Nasal Swab     Status: Abnormal   Collection Time: 05/19/22  6:27 PM   Specimen: Anterior Nasal Swab  Result Value Ref Range   SARS Coronavirus 2 by RT PCR POSITIVE (A) NEGATIVE    Comment: (NOTE) SARS-CoV-2 target nucleic acids are DETECTED.  The SARS-CoV-2 RNA is generally detectable in  upper respiratory specimens during the acute phase of infection. Positive results are indicative of the presence of the identified virus, but do not rule out bacterial infection or co-infection with other pathogens not detected by the test. Clinical correlation with patient history and other diagnostic information is necessary to determine patient infection status. The expected result is Negative.  Fact Sheet for Patients: EntrepreneurPulse.com.au  Fact Sheet for  Healthcare Providers: IncredibleEmployment.be  This test is not yet approved or cleared by the [place] FDA and  has been authorized for detection and/or diagnosis of SARS-CoV-2 by FDA under an Emergency Use Authorization (EUA).  This EUA will remain in effect (meaning this test can be used) for the duration of  the COVID-19 declaration under Section 564(b)(1) of the A ct, 21 U.S.C. section 360bbb-3(b)(1), unless the authorization is terminated or revoked sooner.     Influenza A by PCR NEGATIVE NEGATIVE   Influenza B by PCR NEGATIVE NEGATIVE    Comment: (NOTE) The Xpert Xpress SARS-CoV-2/FLU/RSV plus assay is intended as an aid in the diagnosis of influenza from Nasopharyngeal swab specimens and should not be used as a sole basis for treatment. Nasal washings and aspirates are unacceptable for Xpert Xpress SARS-CoV-2/FLU/RSV testing.  Fact Sheet for Patients: EntrepreneurPulse.com.au  Fact Sheet for Healthcare Providers: IncredibleEmployment.be  This test is not yet approved or cleared by the [place] FDA and has been authorized for detection and/or diagnosis of SARS-CoV-2 by FDA under an Emergency Use Authorization (EUA). This EUA will remain in effect (meaning this test can be used) for the duration of the COVID-19 declaration under Section 564(b)(1) of the Act, 21 U.S.C. section 360bbb-3(b)(1), unless the authorization is terminated or revoked.     Resp Syncytial Virus by PCR NEGATIVE NEGATIVE    Comment: (NOTE) Fact Sheet for Patients: EntrepreneurPulse.com.au  Fact Sheet for Healthcare Providers: IncredibleEmployment.be  This test is not yet approved or cleared by the [place] FDA and has been authorized for detection and/or diagnosis of SARS-CoV-2 by FDA under an Emergency Use Authorization (EUA). This EUA will remain in effect (meaning this test can be used) for  the duration of the COVID-19 declaration under Section 564(b)(1) of the Act, 21 U.S.C. section 360bbb-3(b)(1), unless the authorization is terminated or revoked.  Performed at [facility], [address], [place], [place] [address]   Lactic acid, plasma     Status: Abnormal   Collection Time: 05/19/22  6:33 PM  Result Value Ref Range   Lactic Acid, Venous 2.0 (HH) 0.5 - 1.9 mmol/L    Comment: CRITICAL RESULT CALLED TO, READ BACK BY AND VERIFIED WITH: SAPPELT,J AT 1903 ON 05/19/2022 BY MOSLEY,J Performed at [facility], [address], [place], [place] [address]   Comprehensive metabolic panel     Status: Abnormal   Collection Time: 05/19/22  6:33 PM  Result Value Ref Range   Sodium 133 (L) 135 - 145 mmol/L   Potassium 4.2 3.5 - 5.1 mmol/L   Chloride 100 98 - 111 mmol/L   CO2 25 22 - 32 mmol/L   Glucose, Bld 210 (H) 70 - 99 mg/dL    Comment: Glucose reference range applies only to samples taken after fasting for at least 8 hours.   BUN 33 (H) 8 - 23 mg/dL   Creatinine, Ser 2.26 (H) 0.61 - 1.24 mg/dL   Calcium 8.1 (L) 8.9 - 10.3 mg/dL   Total Protein 6.0 (L) 6.5 - 8.1 g/dL   Albumin 3.0 (L) 3.5 - 5.0 g/dL  AST 28 15 - 41 U/L   ALT 16 0 - 44 U/L   Alkaline Phosphatase 49 38 - 126 U/L   Total Bilirubin 1.0 0.3 - 1.2 mg/dL   GFR, Estimated 27 (L) >60 mL/min    Comment: (NOTE) Calculated using the CKD-EPI Creatinine Equation (2021)    Anion gap 8 5 - 15    Comment: Performed at [facility], [address], [place], [place] [address]  CBC with Differential     Status: Abnormal   Collection Time: 05/19/22  6:33 PM  Result Value Ref Range   WBC 11.1 (H) 4.0 - 10.5 K/uL   RBC 3.05 (L) 4.22 - 5.81 MIL/uL   Hemoglobin 10.1 (L) 13.0 - 17.0 g/dL   HCT 31.6 (L) 39.0 - 52.0 %   MCV 103.6 (H) 80.0 - 100.0 fL   MCH 33.1 26.0 - 34.0 pg   MCHC 32.0 30.0 - 36.0 g/dL   RDW 13.2 11.5 - 15.5 %   Platelets 129 (L) 150 - 400 K/uL   nRBC 0.0 0.0 - 0.2 %   Neutrophils Relative % 71 %    Neutro Abs 7.9 (H) 1.7 - 7.7 K/uL   Lymphocytes Relative 17 %   Lymphs Abs 1.8 0.7 - 4.0 K/uL   Monocytes Relative 10 %   Monocytes Absolute 1.1 (H) 0.1 - 1.0 K/uL   Eosinophils Relative 1 %   Eosinophils Absolute 0.2 0.0 - 0.5 K/uL   Basophils Relative 0 %   Basophils Absolute 0.0 0.0 - 0.1 K/uL   Immature Granulocytes 1 %   Abs Immature Granulocytes 0.10 (H) 0.00 - 0.07 K/uL    Comment: Performed at [facility], [address], [place], [place] [address]  Protime-INR     Status: None   Collection Time: 05/19/22  6:33 PM  Result Value Ref Range   Prothrombin Time 14.9 11.4 - 15.2 seconds   INR 1.2 0.8 - 1.2    Comment: (NOTE) INR goal varies based on device and disease states. Performed at [facility], [address] [place], [place] [address]   Blood Culture (routine x 2)     Status: None   Collection Time: 05/19/22  6:33 PM   Specimen: Site Not Specified; Blood  Result Value Ref Range   Specimen Description      SITE NOT SPECIFIED BOTTLES DRAWN AEROBIC AND ANAEROBIC Performed at [facility], [address], [place], [place] [address]    Special Requests      Blood Culture adequate volume Performed at [facility], [address] [place], [place] [address]    Culture      NO GROWTH 5 DAYS Performed at [facility], [place] [address] [place], [place] [address]    Report Status 05/24/2022 FINAL   Blood gas, venous ([facility], [facility], [facility])     Status: Abnormal   Collection Time: 05/19/22  6:33 PM  Result Value Ref Range   FIO2 36.00 %   pH, Ven 7.37 7.25 - 7.43   pCO2, Ven 47 44 - 60 mmHg   pO2, Ven <31 (LL) 32 - 45 mmHg    Comment: CRITICAL RESULT CALLED TO, READ BACK BY AND VERIFIED WITH: BLACKBURN,C @ 1852 ON 05/19/22 BY JUW    Bicarbonate 28.1 (H) 20.0 - 28.0 mmol/L   Acid-Base Excess 1.4 0.0 - 2.0 mmol/L   O2 Saturation 26.1 %   Patient temperature 34.8    Collection site BLOOD RIGHT FOREARM    Drawn by  [id number]     Comment: Performed at [facility], [address] [place], [place] [address]  Troponin I (High Sensitivity)     Status: None   Collection Time: 05/19/22  6:33 PM  Result Value Ref Range   Troponin I (High Sensitivity) 7 <18 ng/L    Comment: (NOTE) Elevated high sensitivity troponin I (hsTnI) values and significant  changes across serial measurements may suggest ACS but many other  chronic and acute conditions are known to elevate hsTnI results.  Refer to the "Links" section for chest pain algorithms and additional  guidance. Performed at [facility], [address], [place], [place] [address]   Magnesium     Status: None   Collection Time: 05/19/22  6:33 PM  Result Value Ref Range   Magnesium 1.7 1.7 - 2.4 mg/dL    Comment: Performed at [facility], [address], [place], [place] [address]  I-stat chem 8, ED     Status: Abnormal   Collection Time: 05/19/22  6:35 PM  Result Value Ref Range   Sodium 137 135 - 145 mmol/L   Potassium 4.4 3.5 - 5.1 mmol/L   Chloride 101 98 - 111 mmol/L   BUN 29 (H) 8 - 23 mg/dL   Creatinine, Ser 2.40 (H) 0.61 - 1.24 mg/dL   Glucose, Bld 203 (H) 70 - 99 mg/dL    Comment: Glucose reference range applies only to samples taken after fasting for at least 8 hours.   Calcium, Ion 1.14 (L) 1.15 - 1.40 mmol/L   TCO2 25 22 - 32 mmol/L   Hemoglobin 10.9 (L) 13.0 - 17.0 g/dL   HCT 32.0 (L) 39.0 - 52.0 %  Lactic acid, plasma     Status: Abnormal   Collection Time: 05/19/22  7:59 PM  Result Value Ref Range   Lactic Acid, Venous 3.2 (HH) 0.5 - 1.9 mmol/L    Comment: CRITICAL VALUE NOTED.  VALUE IS CONSISTENT WITH PREVIOUSLY REPORTED AND CALLED VALUE. Performed at [facility], [address] [place], [place] [address]   Blood Culture (routine x 2)     Status: None   Collection Time: 05/19/22  7:59 PM   Specimen: BLOOD RIGHT HAND  Result Value Ref Range   Specimen Description      BLOOD RIGHT HAND BOTTLES DRAWN AEROBIC AND ANAEROBIC Performed at [facility], [address] [place], [place] [address]     Special Requests      Blood Culture adequate volume Performed at [facility], [address] [place], [place] [address]    Culture      NO GROWTH 5 DAYS Performed at [facility], [place] [address] [place], [place] [address]    Report Status 05/24/2022 FINAL   Troponin I (High Sensitivity)     Status: None   Collection Time: 05/19/22  7:59 PM  Result Value Ref Range   Troponin I (High Sensitivity) 6 <18 ng/L    Comment: (NOTE) Elevated high sensitivity troponin I (hsTnI) values and significant  changes across serial measurements may suggest ACS but many other  chronic and acute conditions are known to elevate hsTnI results.  Refer to the "Links" section for chest pain algorithms and additional  guidance. Performed at [facility], [address], [place],  [address]   Lactic acid, plasma     Status: Abnormal   Collection Time: 05/19/22 10:40 PM  Result Value Ref Range   Lactic Acid, Venous 3.0 (HH) 0.5 - 1.9 mmol/L  Comment: CRITICAL RESULT CALLED TO, READ BACK BY AND VERIFIED WITH: ESTOCE,R @ 2315 ON 05/19/22 BY JUW Performed at [facility], [address] [place], [place] [address]   Protime-INR     Status: None   Collection Time: 05/19/22 10:40 PM  Result Value Ref Range   Prothrombin Time 15.1 11.4 - 15.2 seconds   INR 1.2 0.8 - 1.2    Comment: (NOTE) INR goal varies based on device and disease states. Performed at [facility], [address], [place], [place] [address]   APTT     Status: None   Collection Time: 05/19/22 10:40 PM  Result Value Ref Range   aPTT 31 24 - 36 seconds    Comment: Performed at [facility], [address], [place], [place] [address]  Urinalysis, Routine w reflex microscopic     Status: Abnormal   Collection Time: 05/19/22 11:00 PM  Result Value Ref Range   Color, Urine YELLOW YELLOW   APPearance HAZY (A) CLEAR   Specific Gravity, Urine 1.016 1.005 - 1.030   pH 5.0 5.0 - 8.0   Glucose, UA NEGATIVE NEGATIVE mg/dL   Hgb urine  dipstick SMALL (A) NEGATIVE   Bilirubin Urine NEGATIVE NEGATIVE   Ketones, ur 5 (A) NEGATIVE mg/dL   Protein, ur 30 (A) NEGATIVE mg/dL   Nitrite NEGATIVE NEGATIVE   Leukocytes,Ua NEGATIVE NEGATIVE   RBC / HPF 0-5 0 - 5 RBC/hpf   WBC, UA 0-5 0 - 5 WBC/hpf   Bacteria, UA RARE (A) NONE SEEN   Squamous Epithelial / HPF 0-5 0 - 5 /HPF   Mucus PRESENT    Hyaline Casts, UA PRESENT     Comment: Performed at [facility], [address], [place], [place] [address]  Urine Culture     Status: None   Collection Time: 05/19/22 11:00 PM   Specimen: In/Out Cath Urine  Result Value Ref Range   Specimen Description      IN/OUT CATH URINE Performed at [facility], [address], [place], [place] [address]    Special Requests      NONE Performed at [facility], [address], [place], [place] [address]    Culture      NO GROWTH Performed at [facility], [place] [address] [place], [place] [address]    Report Status 05/21/2022 FINAL   MRSA Next Gen by PCR, Nasal     Status: None   Collection Time: 05/19/22 11:00 PM   Specimen: Nasal Mucosa; Nasal Swab  Result Value Ref Range   MRSA by PCR Next Gen NOT DETECTED NOT DETECTED    Comment: (NOTE) The GeneXpert MRSA Assay (FDA approved for NASAL specimens only), is one component of a comprehensive MRSA colonization surveillance program. It is not intended to diagnose MRSA infection nor to guide or monitor treatment for MRSA infections. Test performance is not FDA approved in patients less than 24 years old. Performed at [facility], [address] [place], [place] [address]   Glucose, capillary     Status: Abnormal   Collection Time: 05/19/22 11:33 PM  Result Value Ref Range   Glucose-Capillary 255 (H) 70 - 99 mg/dL    Comment: Glucose reference range applies only to samples taken after fasting for at least 8 hours.  Lactic acid, plasma     Status: None   Collection Time: 05/20/22 12:55 AM  Result Value Ref Range   Lactic Acid, Venous  1.1 0.5 - 1.9 mmol/L    Comment:  Performed at [facility], [address], [place], [place] [id number]  Basic metabolic panel     Status: Abnormal   Collection Time: 05/20/22  4:11 AM  Result Value Ref Range   Sodium 135 135 - 145 mmol/L   Potassium 4.2 3.5 - 5.1 mmol/L   Chloride 105 98 - 111 mmol/L   CO2 21 (L) 22 - 32 mmol/L   Glucose, Bld 213 (H) 70 - 99 mg/dL    Comment: Glucose reference range applies only to samples taken after fasting for at least 8 hours.   BUN 32 (H) 8 - 23 mg/dL   Creatinine, Ser 1.81 (H) 0.61 - 1.24 mg/dL   Calcium 8.1 (L) 8.9 - 10.3 mg/dL   GFR, Estimated 35 (L) >60 mL/min    Comment: (NOTE) Calculated using the CKD-EPI Creatinine Equation (2021)    Anion gap 9 5 - 15    Comment: Performed at [facility], [address] [place], [place] [address]  CBC     Status: Abnormal   Collection Time: 05/20/22  4:11 AM  Result Value Ref Range   WBC 8.5 4.0 - 10.5 K/uL   RBC 3.34 (L) 4.22 - 5.81 MIL/uL   Hemoglobin 10.7 (L) 13.0 - 17.0 g/dL   HCT 34.9 (L) 39.0 - 52.0 %   MCV 104.5 (H) 80.0 - 100.0 fL   MCH 32.0 26.0 - 34.0 pg   MCHC 30.7 30.0 - 36.0 g/dL   RDW 12.8 11.5 - 15.5 %   Platelets 136 (L) 150 - 400 K/uL   nRBC 0.0 0.0 - 0.2 %    Comment: Performed at [facility], [address] [place], [place] [address]  Lactic acid, plasma     Status: None   Collection Time: 05/20/22  4:11 AM  Result Value Ref Range   Lactic Acid, Venous 1.4 0.5 - 1.9 mmol/L    Comment: Performed at [facility], [address] [place], [place] [address]  Brain natriuretic peptide     Status: Abnormal   Collection Time: 05/20/22  7:14 AM  Result Value Ref Range   B Natriuretic Peptide 108.0 (H) 0.0 - 100.0 pg/mL    Comment: Performed at [facility], [address] [place], [place] [address]  Glucose, capillary     Status: Abnormal   Collection Time: 05/20/22  7:33 AM  Result Value Ref Range   Glucose-Capillary 169 (H) 70 - 99 mg/dL    Comment: Glucose reference range  applies only to samples taken after fasting for at least 8 hours.  Glucose, capillary     Status: Abnormal   Collection Time: 05/20/22 11:21 AM  Result Value Ref Range   Glucose-Capillary 206 (H) 70 - 99 mg/dL    Comment: Glucose reference range applies only to samples taken after fasting for at least 8 hours.  Glucose, capillary     Status: Abnormal   Collection Time: 05/20/22  4:39 PM  Result Value Ref Range   Glucose-Capillary 214 (H) 70 - 99 mg/dL    Comment: Glucose reference range applies only to samples taken after fasting for at least 8 hours.  Glucose, capillary     Status: Abnormal   Collection Time: 05/20/22  9:56 PM  Result Value Ref Range   Glucose-Capillary 259 (H) 70 - 99 mg/dL    Comment: Glucose reference range applies only to samples taken after fasting for at least 8 hours.  Vancomycin, random     Status: None   Collection Time: 05/21/22  5:06 AM  Result Value Ref Range   Vancomycin Rm 5 ug/mL    Comment:        Random Vancomycin therapeutic range is dependent on dosage and time of specimen collection. A peak range is 20-40 ug/mL A trough range is 5-15 ug/mL        Performed at [facility] [place], [place] [id number]   Basic metabolic panel     Status: Abnormal   Collection Time: 05/21/22  5:06 AM  Result Value Ref Range   Sodium 135 135 - 145 mmol/L   Potassium 3.8 3.5 - 5.1 mmol/L   Chloride 104 98 - 111 mmol/L   CO2 23 22 - 32 mmol/L   Glucose, Bld 205 (H) 70 - 99 mg/dL    Comment: Glucose reference range applies only to samples taken after fasting for at least 8 hours.   BUN 34 (H) 8 - 23 mg/dL   Creatinine, Ser 1.48 (H) 0.61 - 1.24 mg/dL   Calcium 8.1 (L) 8.9 - 10.3 mg/dL   GFR, Estimated 44 (L) >60 mL/min    Comment: (NOTE) Calculated using the CKD-EPI Creatinine Equation (2021)    Anion gap 8 5 - 15    Comment: Performed at [facility], [address] [place], [place] [address]  Glucose, capillary     Status: Abnormal    Collection Time: 05/21/22  7:44 AM  Result Value Ref Range   Glucose-Capillary 199 (H) 70 - 99 mg/dL    Comment: Glucose reference range applies only to samples taken after fasting for at least 8 hours.  Glucose, capillary     Status: Abnormal   Collection Time: 05/21/22 11:26 AM  Result Value Ref Range   Glucose-Capillary 248 (H) 70 - 99 mg/dL    Comment: Glucose reference range applies only to samples taken after fasting for at least 8 hours.  Glucose, capillary     Status: Abnormal   Collection Time: 05/21/22  4:40 PM  Result Value Ref Range   Glucose-Capillary 310 (H) 70 - 99 mg/dL    Comment: Glucose reference range applies only to samples taken after fasting for at least 8 hours.  Glucose, capillary     Status: Abnormal   Collection Time: 05/21/22 10:46 PM  Result Value Ref Range   Glucose-Capillary 202 (H) 70 - 99 mg/dL    Comment: Glucose reference range applies only to samples taken after fasting for at least 8 hours.  Glucose, capillary     Status: Abnormal   Collection Time: 05/22/22  8:13 AM  Result Value Ref Range   Glucose-Capillary 231 (H) 70 - 99 mg/dL    Comment: Glucose reference range applies only to samples taken after fasting for at least 8 hours.  Basic metabolic panel     Status: Abnormal   Collection Time: 05/22/22  8:37 AM  Result Value Ref Range   Sodium 137 135 - 145 mmol/L   Potassium 3.7 3.5 - 5.1 mmol/L   Chloride 105 98 - 111 mmol/L   CO2 24 22 - 32 mmol/L   Glucose, Bld 253 (H) 70 - 99 mg/dL    Comment: Glucose reference range applies only to samples taken after fasting for at least 8 hours.   BUN 36 (H) 8 - 23 mg/dL   Creatinine, Ser 1.28 (H) 0.61 - 1.24 mg/dL   Calcium 8.4 (L) 8.9 - 10.3 mg/dL   GFR, Estimated 53 (L) >60 mL/min    Comment: (NOTE) Calculated using  the CKD-EPI Creatinine Equation (2021)    Anion gap 8 5 - 15    Comment: Performed at [facility], [address], [place], [place] [address]  Magnesium     Status: None    Collection Time: 05/22/22  8:37 AM  Result Value Ref Range   Magnesium 2.0 1.7 - 2.4 mg/dL    Comment: Performed at [facility], [address], [place], [place] [address]  CBC     Status: Abnormal   Collection Time: 05/22/22  8:37 AM  Result Value Ref Range   WBC 16.0 (H) 4.0 - 10.5 K/uL   RBC 3.59 (L) 4.22 - 5.81 MIL/uL   Hemoglobin 11.6 (L) 13.0 - 17.0 g/dL   HCT 35.9 (L) 39.0 - 52.0 %   MCV 100.0 80.0 - 100.0 fL   MCH 32.3 26.0 - 34.0 pg   MCHC 32.3 30.0 - 36.0 g/dL   RDW 12.7 11.5 - 15.5 %   Platelets 177 150 - 400 K/uL   nRBC 0.2 0.0 - 0.2 %    Comment: Performed at [facility], [address], [place], [place] [address]    Radiology CT CHEST ABDOMEN PELVIS WO CONTRAST  Result Date: 05/19/2022 CLINICAL DATA:  Sepsis, tested positive for COVID-19 yesterday, nausea, vomiting, diarrhea, hypoxemia EXAM: CT CHEST, ABDOMEN AND PELVIS WITHOUT CONTRAST TECHNIQUE: Multidetector CT imaging of the chest, abdomen and pelvis was performed following the standard protocol without IV contrast. RADIATION DOSE REDUCTION: This exam was performed according to the departmental dose-optimization program which includes automated exposure control, adjustment of the mA and/or kV according to patient size and/or use of iterative reconstruction technique. COMPARISON:  03/03/2022 CT abdomen and pelvis FINDINGS: CT CHEST FINDINGS Cardiovascular: Atherosclerotic calcifications aorta, proximal great vessels, and coronary arteries. Aneurysmal dilatation ascending thoracic aorta 4.1 cm transverse image 25. Heart size normal. No pericardial effusion. Mediastinum/Nodes: Question small hiatal hernia. Esophagus normal. Base of cervical region normal appearance. No thoracic adenopathy. Lungs/Pleura: Peribronchial thickening. Dependent atelectasis BILATERAL lower lobes, less in upper lobes. No definite infiltrate, pleural effusion, or pneumothorax. Musculoskeletal: No acute osseous findings. CT ABDOMEN PELVIS FINDINGS  Hepatobiliary: 2.3 cm diameter gallstone within gallbladder. Gallbladder and liver otherwise normal appearance. Pancreas: Normal appearance Spleen: Normal appearance Adrenals/Urinary Tract: Adrenal glands normal appearance. BILATERAL renal cysts unchanged, largest 2.2 cm diameter anterior LEFT kidney image 54; no follow-up imaging recommended. No hydronephrosis, hydroureter or urinary tract calcification. Bladder unremarkable Stomach/Bowel: Diffuse colonic diverticulosis without evidence of diverticulitis. Wall thickening of the ascending colon through hepatic flexure of colon question colitis. Stomach unremarkable. Mild fecalization of small bowel contents distally though no point of obstruction, bowel wall thickening, or small bowel dilatation is identified. Vascular/Lymphatic: Tortuous abdominal aorta with scattered atherosclerotic calcifications. Aorta normal caliber. Additional atherosclerotic calcifications within iliac arteries. No adenopathy. Reproductive: Minimal prostatic enlargement for age Other: No free air or free fluid. RIGHT inguinal and small umbilical hernias containing fat. Musculoskeletal: Degenerative changes lumbar spine and LEFT hip joint IMPRESSION: Bibasilar atelectasis without definite infiltrate. Diffuse colonic diverticulosis without evidence of diverticulitis. Wall thickening of the ascending colon through hepatic flexure of colon question colitis; this could be due to infection, inflammatory bowel disease, or less likely ischemia. Cholelithiasis. RIGHT inguinal and small umbilical hernias containing fat. Aneurysmal dilatation of ascending thoracic aorta 4.1 cm transverse, recommendation below. Recommend annual imaging followup by CTA or MRA. This recommendation follows 2010 ACCF/AHA/AATS/ACR/ASA/SCA/SCAI/SIR/STS/SVM Guidelines for the Diagnosis and Management of Patients with Thoracic Aortic Disease. Circulation. 2010; [id number]. Aortic aneurysm NOS (ICD10-I71.9)  Aortic  Atherosclerosis (ICD10-I70.0). Aortic aneurysm NOS (ICD10-I71.9). Electronically Signed   By: Lavonia Dana M.D.   On: 05/19/2022 21:12   CT Head Wo Contrast  Result Date: 05/19/2022 CLINICAL DATA:  Syncope EXAM: CT HEAD WITHOUT CONTRAST TECHNIQUE: Contiguous axial images were obtained from the base of the skull through the vertex without intravenous contrast. RADIATION DOSE REDUCTION: This exam was performed according to the departmental dose-optimization program which includes automated exposure control, adjustment of the mA and/or kV according to patient size and/or use of iterative reconstruction technique. COMPARISON:  March 03, 2022 FINDINGS: Brain: There is mild cerebral atrophy with widening of the extra-axial spaces and ventricular dilatation. There are areas of decreased attenuation within the white matter tracts of the supratentorial brain, consistent with microvascular disease changes. Vascular: No hyperdense vessel or unexpected calcification. Skull: Normal. Negative for fracture or focal lesion. Sinuses/Orbits: There is moderate to marked severity bilateral ethmoid sinus mucosal thickening. Other: None. IMPRESSION: 1. No acute intracranial abnormality. 2. Generalized cerebral atrophy with widening of the extra-axial spaces and ventricular dilatation. 3. Moderate to marked severity bilateral ethmoid sinus disease. Electronically Signed   By: Virgina Norfolk M.D.   On: 05/19/2022 20:58   DG Chest Port 1 View  Result Date: 05/19/2022 CLINICAL DATA:  Questionable sepsis. EXAM: PORTABLE CHEST 1 VIEW COMPARISON:  03/03/2022 FINDINGS: 1824 hours. Low volume film. The cardio pericardial silhouette is enlarged. There is pulmonary vascular congestion without overt pulmonary edema. No focal airspace consolidation or overt pulmonary edema. No substantial pleural effusion. The visualized bony structures of the thorax are unremarkable. Telemetry leads overlie the chest. IMPRESSION: Low volume film with  vascular congestion. Electronically Signed   By: Misty Stanley M.D.   On: 05/19/2022 18:41    Assessment/Plan Essential hypertension blood pressure control important in reducing the progression of atherosclerotic disease. On appropriate oral medications.     Type 2 diabetes mellitus with complication (HCC) blood glucose control important in reducing the progression of atherosclerotic disease. Also, involved in wound healing. On appropriate medications.     Hyperlipidemia lipid control important in reducing the progression of atherosclerotic disease. Continue statin therapy  Heart failure ([place]) Can worsen LE swelling  Lymphedema His legs are doing quite well.  His family really has worked nicely with him and he has stayed diligent about elevation and exercise.  I will see him in 6 months.  Call with any questions or concerns in the interim.    Leotis Pain, MD  06/08/2022 11:11 AM    This note was created with Dragon medical transcription system.  Any errors from dictation are purely unintentional

## 2022-06-08 NOTE — Assessment & Plan Note (Signed)
Can worsen LE swelling

## 2022-10-29 ENCOUNTER — Emergency Department (HOSPITAL_COMMUNITY)
Admission: EM | Admit: 2022-10-29 | Discharge: 2022-10-30 | Disposition: A | Discharge status: Home / Self Care | Payer: Medicare PPO | Attending: Emergency Medicine | Admitting: Emergency Medicine

## 2022-10-29 ENCOUNTER — Encounter (HOSPITAL_COMMUNITY): Payer: Self-pay | Admitting: Emergency Medicine

## 2022-10-29 ENCOUNTER — Other Ambulatory Visit: Payer: Self-pay

## 2022-10-29 ENCOUNTER — Emergency Department (HOSPITAL_COMMUNITY): Payer: Medicare PPO

## 2022-10-29 DIAGNOSIS — Y9301 Activity, walking, marching and hiking: Secondary | ICD-10-CM | POA: Diagnosis not present

## 2022-10-29 DIAGNOSIS — F0390 Unspecified dementia without behavioral disturbance: Secondary | ICD-10-CM | POA: Diagnosis not present

## 2022-10-29 DIAGNOSIS — Z7982 Long term (current) use of aspirin: Secondary | ICD-10-CM | POA: Insufficient documentation

## 2022-10-29 DIAGNOSIS — I10 Essential (primary) hypertension: Secondary | ICD-10-CM | POA: Diagnosis not present

## 2022-10-29 DIAGNOSIS — W19XXXA Unspecified fall, initial encounter: Secondary | ICD-10-CM

## 2022-10-29 DIAGNOSIS — E119 Type 2 diabetes mellitus without complications: Secondary | ICD-10-CM | POA: Diagnosis not present

## 2022-10-29 DIAGNOSIS — Z79899 Other long term (current) drug therapy: Secondary | ICD-10-CM | POA: Insufficient documentation

## 2022-10-29 DIAGNOSIS — W01198A Fall on same level from slipping, tripping and stumbling with subsequent striking against other object, initial encounter: Secondary | ICD-10-CM | POA: Insufficient documentation

## 2022-10-29 DIAGNOSIS — Z7984 Long term (current) use of oral hypoglycemic drugs: Secondary | ICD-10-CM | POA: Insufficient documentation

## 2022-10-29 DIAGNOSIS — S0990XA Unspecified injury of head, initial encounter: Secondary | ICD-10-CM | POA: Diagnosis present

## 2022-10-29 NOTE — ED Triage Notes (Addendum)
Pt states he tripped walking not using his walker. Pt has knot to the right side of head.

## 2022-10-29 NOTE — Discharge Instructions (Signed)
You were seen today for fall.  Make sure that you are using your walker at home.  Your CT imaging today was negative for any acute injuries.

## 2022-10-29 NOTE — ED Provider Notes (Signed)
[place] EMERGENCY DEPARTMENT AT [facility] Provider Note   CSN: [id number] Arrival date & time: 10/29/22  2023     History  Chief Complaint  Patient presents with   Tim Henry is a 89 y.o. male.  HPI     This is a 89 year old male who presents after fall.  Patient reports tripping and falling.  He is supposed to walk with a walker but was not using it at the time.  He fell hitting his head.  He did not lose consciousness.  He is on no blood thinners.  Reports that his children wanted him to "get checked out."  He has no complaints at this time.  Home Medications Prior to Admission medications   Medication Sig Start Date End Date Taking? Authorizing Provider  acetaminophen (TYLENOL) 500 MG tablet Take 500 mg by mouth 2 (two) times daily as needed for fever, headache, moderate pain or mild pain.    [provider]  albuterol (VENTOLIN HFA) 108 (90 Base) MCG/ACT inhaler Inhale 2 puffs into the lungs every 6 (six) hours as needed for wheezing or shortness of breath. 03/06/22   Shon Hale, MD  ALPRAZolam Prudy Feeler) 0.5 MG tablet Take 0.5 mg by mouth 2 (two) times daily.    [provider]  aspirin EC 81 MG tablet Take 1 tablet (81 mg total) by mouth daily with breakfast. Swallow whole. Patient taking differently: Take 81 mg by mouth daily with breakfast. 03/06/22   Emokpae, Courage, MD  BLACK ELDERBERRY PO Take 1 capsule by mouth at bedtime.    [provider]  carvedilol (COREG) 6.25 MG tablet Take 6.25 mg by mouth 2 (two) times daily with a meal.  11/20/17   [provider]  dextromethorphan-guaiFENesin (MUCINEX DM) 30-600 MG 12hr tablet Take 1 tablet by mouth 2 (two) times daily. 03/06/22   Shon Hale, MD  diphenhydrAMINE (BENADRYL) 25 MG tablet Take 25 mg by mouth at bedtime.    [provider]  dorzolamide-timolol (COSOPT) 22.3-6.8 MG/ML ophthalmic solution Place 1 drop into both eyes 2 (two) times daily.     [provider]  ferrous sulfate 325 (65 FE) MG tablet Take 325 mg by mouth at bedtime.    [provider]  finasteride (PROSCAR) 5 MG tablet Take 1 tablet (5 mg total) by mouth daily. 04/05/22   Stoioff, Verna Czech, MD  furosemide (LASIX) 20 MG tablet Take 1 tablet (20 mg total) by mouth daily. 03/06/22   Shon Hale, MD  gabapentin (NEURONTIN) 300 MG capsule Take 300 mg by mouth 2 (two) times daily with a meal. 1300, 2300    [provider]  Glucosamine HCl (GLUCOSAMINE PO) Take 2 tablets by mouth at bedtime.    [provider]  latanoprost (XALATAN) 0.005 % ophthalmic solution Place 1 drop into both eyes at bedtime.    [provider]  metFORMIN (GLUCOPHAGE) 500 MG tablet Take 1 tablet (500 mg total) by mouth 2 (two) times daily with a meal. 1 PM, 11 PM Patient taking differently: Take 500 mg by mouth 2 (two) times daily with a meal. 1300, 2300. 03/06/22   Emokpae, Courage, MD  Multiple Vitamins-Minerals (MULTIVITAMIN WITH MINERALS) tablet Take 1 tablet by mouth at bedtime.    [provider]  OIL OF OREGANO PO Take 1 capsule by mouth at bedtime.    [provider]  omeprazole (PRILOSEC) 40 MG capsule Take 40 mg by mouth daily. 02/07/18  [provider]  PHOSPHATIDYL CHOLINE PO Take 2 tablets by mouth at bedtime.    [provider]  potassium chloride (K-DUR) 10 MEQ tablet Take 20 mEq by mouth daily.    [provider]  Probiotic Product (PROBIOTIC DAILY PO) Take 1 capsule by mouth at bedtime.    [provider]  sertraline (ZOLOFT) 100 MG tablet Take 100 mg by mouth daily.    [provider]  simvastatin (ZOCOR) 10 MG tablet Take 10 mg by mouth at bedtime.    [provider]  sitaGLIPtin (JANUVIA) 100 MG tablet Take 100 mg by mouth daily.    [provider]  SPIRULINA PO Take 1 tablet by mouth at bedtime.    [provider]  sucralfate (CARAFATE) 1 g tablet  Take 1 g by mouth 2 (two) times daily.    [provider]  tamsulosin (FLOMAX) 0.4 MG CAPS capsule Take 1 capsule (0.4 mg total) by mouth daily after breakfast. 1 PM Patient taking differently: Take 0.4 mg by mouth daily after breakfast. 1300 04/05/22   Stoioff, Verna Czech, MD  telmisartan (MICARDIS) 40 MG tablet Take 20 mg by mouth daily. 11/20/17   [provider]  verapamil (CALAN-SR) 240 MG CR tablet Take 240 mg by mouth daily.    [provider]  Vitamin D, Ergocalciferol, (DRISDOL) 1.25 MG (50000 UT) CAPS capsule Take 50,000 Units by mouth every Wednesday. 02/07/18   [provider]      Allergies    Patient has no known allergies.    Review of Systems   Review of Systems  Constitutional:  Negative for fever.  Respiratory:  Negative for shortness of breath.   Cardiovascular:  Negative for chest pain.  Neurological:  Negative for syncope and weakness.  All other systems reviewed and are negative.   Physical Exam Updated Vital Signs BP 125/82 (BP Location: Right Arm)   Pulse 78   Temp 97.9 F (36.6 C) (Oral)   Resp 18   Ht 1.803 m (5\' 11" )   Wt 102.1 kg   SpO2 100%   BMI 31.39 kg/m  Physical Exam Vitals and nursing note reviewed.  Constitutional:      Appearance: He is well-developed.     Comments: Well-appearing, appears younger than stated age  HENT:     Head: Normocephalic.     Comments: Tenderness to palpation left scalp, no significant hematoma    Nose: Nose normal.     Mouth/Throat:     Mouth: Mucous membranes are moist.  Eyes:     Extraocular Movements: Extraocular movements intact.     Pupils: Pupils are equal, round, and reactive to light.  Neck:     Comments: No midline C-spine tenderness palpation, step-off, deformity Cardiovascular:     Rate and Rhythm: Normal rate and regular rhythm.     Heart sounds: Normal heart sounds. No murmur heard. Pulmonary:     Effort: Pulmonary effort is normal. No respiratory distress.      Breath sounds: Normal breath sounds. No wheezing.  Abdominal:     Palpations: Abdomen is soft.     Tenderness: There is no abdominal tenderness.  Musculoskeletal:        General: No deformity.     Cervical back: Neck supple.  Lymphadenopathy:     Cervical: No cervical adenopathy.  Skin:    General: Skin is warm and dry.  Neurological:     Mental Status: He is alert and oriented to person, place,  and time.  Psychiatric:        Mood and Affect: Mood normal.     ED Results / Procedures / Treatments   Labs (all labs ordered are listed, but only abnormal results are displayed) Labs Reviewed - No data to display  EKG None  Radiology CT Head Wo Contrast  Result Date: 10/29/2022 CLINICAL DATA:  Head and neck trauma, tripped while walking without walker. EXAM: CT HEAD WITHOUT CONTRAST CT CERVICAL SPINE WITHOUT CONTRAST TECHNIQUE: Multidetector CT imaging of the head and cervical spine was performed following the standard protocol without intravenous contrast. Multiplanar CT image reconstructions of the cervical spine were also generated. RADIATION DOSE REDUCTION: This exam was performed according to the departmental dose-optimization program which includes automated exposure control, adjustment of the mA and/or kV according to patient size and/or use of iterative reconstruction technique. COMPARISON:  05/19/2022. FINDINGS: CT HEAD FINDINGS Brain: No acute intracranial hemorrhage, midline shift or mass effect. No extra-axial fluid collection. Mild atrophy is noted. Periventricular white matter hypodensities are present bilaterally. No hydrocephalus. 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 Vascular: No hyperdense vessel or unexpected calcification. Skull: Normal. Negative for fracture or focal lesion. Sinuses/Orbits: No acute finding. Other: Scalp hematoma is noted over the frontal bone on the right. CT CERVICAL SPINE FINDINGS Alignment: Normal. Skull base and vertebrae:  No acute fracture. No primary bone lesion or focal pathologic process. Soft tissues and spinal canal: No prevertebral fluid or swelling. No visible canal hematoma. Disc levels: Multilevel intervertebral disc space narrowing, disc osteophyte complexes, and facet arthropathy. Heterotopic calcifications are noted along the ligamentum flavum at C4 resulting in mass effect upon the posterior aspect of the thecal sac and likely moderate-to-severe compression of the spinal cord at this level. Upper chest: No acute abnormality. Other: There is a hypodense nodule in the right lobe of the thyroid gland measuring 9 mm. No additional imaging is recommended. Carotid artery calcifications. IMPRESSION: 1. No acute intracranial process. 2. Atrophy with chronic microvascular ischemic changes. 3. No acute fracture in the cervical spine. 4. Multilevel degenerative changes with heterotopic calcifications in the region of the ligamentum flavum at C4 resulting in mass effect on posterior aspect of the thecal sac with likely moderate to severe compression of the spinal cord in the region. Consider MRI for further evaluation if clinically warranted. Electronically Signed   By: Thornell Sartorius M.D.   On: 10/29/2022 23:33   CT Cervical Spine Wo Contrast  Result Date: 10/29/2022 CLINICAL DATA:  Head and neck trauma, tripped while walking without walker. EXAM: CT HEAD WITHOUT CONTRAST CT CERVICAL SPINE WITHOUT CONTRAST TECHNIQUE: Multidetector CT imaging of the head and cervical spine was performed following the standard protocol without intravenous contrast. Multiplanar CT image reconstructions of the cervical spine were also generated. RADIATION DOSE REDUCTION: This exam was performed according to the departmental dose-optimization program which includes automated exposure control, adjustment of the mA and/or kV according to patient size and/or use of iterative reconstruction technique. COMPARISON:  05/19/2022. FINDINGS: CT HEAD FINDINGS  Brain: No acute intracranial hemorrhage, midline shift or mass effect. No extra-axial fluid collection. Mild atrophy is noted. Periventricular white matter hypodensities are present bilaterally. No hydrocephalus. 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 Vascular: No hyperdense vessel or unexpected calcification. Skull: Normal. Negative for fracture or focal lesion. Sinuses/Orbits: No acute finding. Other: Scalp hematoma is noted over the frontal bone on the right. CT CERVICAL SPINE FINDINGS Alignment: Normal. Skull base and  vertebrae: No acute fracture. No primary bone lesion or focal pathologic process. Soft tissues and spinal canal: No prevertebral fluid or swelling. No visible canal hematoma. Disc levels: Multilevel intervertebral disc space narrowing, disc osteophyte complexes, and facet arthropathy. Heterotopic calcifications are noted along the ligamentum flavum at C4 resulting in mass effect upon the posterior aspect of the thecal sac and likely moderate-to-severe compression of the spinal cord at this level. Upper chest: No acute abnormality. Other: There is a hypodense nodule in the right lobe of the thyroid gland measuring 9 mm. No additional imaging is recommended. Carotid artery calcifications. IMPRESSION: 1. No acute intracranial process. 2. Atrophy with chronic microvascular ischemic changes. 3. No acute fracture in the cervical spine. 4. Multilevel degenerative changes with heterotopic calcifications in the region of the ligamentum flavum at C4 resulting in mass effect on posterior aspect of the thecal sac with likely moderate to severe compression of the spinal cord in the region. Consider MRI for further evaluation if clinically warranted. Electronically Signed   By: Thornell Sartorius M.D.   On: 10/29/2022 23:33    Procedures Procedures    Medications Ordered in ED Medications - No data to display  ED Course/ Medical Decision Making/ A&P                              Medical Decision Making Amount and/or Complexity of Data Reviewed Radiology: ordered.   This patient presents to the ED for concern of fall, this involves an extensive number of treatment options, and is a complaint that carries with it a high risk of complications and morbidity.  I considered the following differential and admission for this acute, potentially life threatening condition.  The differential diagnosis includes traumatic injury such as long bone injury, subdural hematoma, epidural hematoma, abrasion, contusion  MDM:    This is a 89 year old male who presents after mechanical fall.  He is overall nontoxic and vital signs are reassuring.  He has no complaints at this time.  Reports mechanical fall.  No obvious deformities.  Given age, he is at high risk per [place] CT head rules.  CT head and cervical spine were obtained.  CTs reviewed and show no evidence of intracranial bleed or acute fracture.  Discussed with patient that he needs to make sure he is using his walker appropriately.  (Labs, imaging, consults)  Labs: I Ordered, and personally interpreted labs.  The pertinent results include: None  Imaging Studies ordered: I ordered imaging studies including CT head, cervical spine I independently visualized and interpreted imaging. I agree with the radiologist interpretation  Additional history obtained from chart review.  External records from outside source obtained and reviewed including prior evaluations  Cardiac Monitoring: The patient was not maintained on a cardiac monitor.  If on the cardiac monitor, I personally viewed and interpreted the cardiac monitored which showed an underlying rhythm of: N/A  Reevaluation: After the interventions noted above, I reevaluated the patient and found that they have :resolved  Social Determinants of Health:  elderly  Disposition: Discharge  Co morbidities that complicate the patient evaluation  Past Medical History:   Diagnosis Date   Anemia    in past   Anxiety    Dementia (HCC)    able to sign own papers   Depression    Diabetes mellitus without complication (HCC)    Full dentures    upper and lower   GERD (gastroesophageal reflux disease)  Head trauma    admitted to [facility], "pint of blood removed from head", no deficits after   Hypercholesteremia    Hypertension    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)    none since release from hosp     Medicines No orders of the defined types were placed in this encounter.   I have reviewed the patients home medicines and have made adjustments as needed  Problem List / ED Course: Problem List Items Addressed This Visit   None Visit Diagnoses     Fall, initial encounter    -  Primary                   Final Clinical Impression(s) / ED Diagnoses Final diagnoses:  Fall, initial encounter    Rx / DC Orders ED Discharge Orders     None         Shynia Daleo, Mayer Masker, MD 10/30/22 0000

## 2022-12-07 ENCOUNTER — Ambulatory Visit (INDEPENDENT_AMBULATORY_CARE_PROVIDER_SITE_OTHER): Payer: Medicare PPO | Admitting: Vascular Surgery

## 2022-12-07 VITALS — BP 93/59 | HR 61 | Resp 19

## 2022-12-07 DIAGNOSIS — I509 Heart failure, unspecified: Secondary | ICD-10-CM | POA: Diagnosis not present

## 2022-12-07 DIAGNOSIS — I10 Essential (primary) hypertension: Secondary | ICD-10-CM

## 2022-12-07 DIAGNOSIS — E785 Hyperlipidemia, unspecified: Secondary | ICD-10-CM

## 2022-12-07 DIAGNOSIS — I890 Lymphedema, not elsewhere classified: Secondary | ICD-10-CM

## 2022-12-07 DIAGNOSIS — E118 Type 2 diabetes mellitus with unspecified complications: Secondary | ICD-10-CM | POA: Diagnosis not present

## 2022-12-07 NOTE — Progress Notes (Signed)
MRN : [id number]  Tim Henry is a 89 y.o. (Mar 29, 1928) male who presents with chief complaint of No chief complaint on file. Marland Kitchen  History of Present Illness: Patient returns today in follow up of lymphedema and leg swelling.  His family is doing an amazing job of keeping his leg swelling down with compression, elevation, and exercise.  They are also using moisturizers daily which has kept his skin smooth and soft with no new ulceration or weeping.  He is in good spirits today.  He has been exercising regularly.  He has had his 89rd birthday since his last visit and continues to do quite well.  Current Outpatient Medications  Medication Sig Dispense Refill   acetaminophen (TYLENOL) 500 MG tablet Take 500 mg by mouth 2 (two) times daily as needed for fever, headache, moderate pain or mild pain.     albuterol (VENTOLIN HFA) 108 (90 Base) MCG/ACT inhaler Inhale 2 puffs into the lungs every 6 (six) hours as needed for wheezing or shortness of breath. 8 g 2   ALPRAZolam (XANAX) 0.5 MG tablet Take 0.5 mg by mouth 2 (two) times daily.     aspirin EC 81 MG tablet Take 1 tablet (81 mg total) by mouth daily with breakfast. Swallow whole. (Patient taking differently: Take 81 mg by mouth daily with breakfast.) 30 tablet 11   BLACK ELDERBERRY PO Take 1 capsule by mouth at bedtime.     carvedilol (COREG) 6.25 MG tablet Take 6.25 mg by mouth 2 (two) times daily with a meal.   10   dextromethorphan-guaiFENesin (MUCINEX DM) 30-600 MG 12hr tablet Take 1 tablet by mouth 2 (two) times daily. 20 tablet 0   diphenhydrAMINE (BENADRYL) 25 MG tablet Take 25 mg by mouth at bedtime.     dorzolamide-timolol (COSOPT) 22.3-6.8 MG/ML ophthalmic solution Place 1 drop into both eyes 2 (two) times daily.     ferrous sulfate 325 (65 FE) MG tablet Take 325 mg by mouth at bedtime.     finasteride (PROSCAR) 5 MG tablet Take 1 tablet (5 mg total) by mouth daily. 90 tablet 3   furosemide (LASIX) 20 MG tablet Take 1 tablet (20 mg  total) by mouth daily. 30 tablet 1   gabapentin (NEURONTIN) 300 MG capsule Take 300 mg by mouth 2 (two) times daily with a meal. 1300, 2300     Glucosamine HCl (GLUCOSAMINE PO) Take 2 tablets by mouth at bedtime.     latanoprost (XALATAN) 0.005 % ophthalmic solution Place 1 drop into both eyes at bedtime.     metFORMIN (GLUCOPHAGE) 500 MG tablet Take 1 tablet (500 mg total) by mouth 2 (two) times daily with a meal. 1 PM, 11 PM (Patient taking differently: Take 500 mg by mouth 2 (two) times daily with a meal. 1300, 2300.) 60 tablet 3   Multiple Vitamins-Minerals (MULTIVITAMIN WITH MINERALS) tablet Take 1 tablet by mouth at bedtime.     OIL OF OREGANO PO Take 1 capsule by mouth at bedtime.     omeprazole (PRILOSEC) 40 MG capsule Take 40 mg by mouth daily.  5   PHOSPHATIDYL CHOLINE PO Take 2 tablets by mouth at bedtime.     potassium chloride (K-DUR) 10 MEQ tablet Take 20 mEq by mouth daily.     Probiotic Product (PROBIOTIC DAILY PO) Take 1 capsule by mouth at bedtime.     sertraline (ZOLOFT) 100 MG tablet Take 100 mg by mouth daily.     simvastatin (ZOCOR) 10 MG tablet  Take 10 mg by mouth at bedtime.     sitaGLIPtin (JANUVIA) 100 MG tablet Take 100 mg by mouth daily.     SPIRULINA PO Take 1 tablet by mouth at bedtime.     sucralfate (CARAFATE) 1 g tablet Take 1 g by mouth 2 (two) times daily.     tamsulosin (FLOMAX) 0.4 MG CAPS capsule Take 1 capsule (0.4 mg total) by mouth daily after breakfast. 1 PM (Patient taking differently: Take 0.4 mg by mouth daily after breakfast. 1300) 30 capsule 11   telmisartan (MICARDIS) 40 MG tablet Take 20 mg by mouth daily.  5   verapamil (CALAN-SR) 240 MG CR tablet Take 240 mg by mouth daily.     Vitamin D, Ergocalciferol, (DRISDOL) 1.25 MG (50000 UT) CAPS capsule Take 50,000 Units by mouth every Wednesday.  5   No current facility-administered medications for this visit.    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)    none since release from hosp    Past Surgical History:  Procedure Laterality Date   APPENDECTOMY     childhood   BACK SURGERY  1963   CATARACT EXTRACTION W/PHACO Left 09/15/2014   Procedure: CATARACT EXTRACTION PHACO AND INTRAOCULAR LENS PLACEMENT (IOC);  Surgeon: Lockie Mola, MD;  Location: [facility];  Service: Ophthalmology;  Laterality: Left;  DIABETIC PT WOULD LIKE TO HAVE ARRIVAL TIME LATE AM   JOINT REPLACEMENT Left    knee   PHOTOCOAGULATION WITH LASER Right 01/23/2016   Procedure: PHOTOCOAGULATION WITH LASER;  Surgeon: Sherald Hess, MD;  Location: [facility];  Service: Ophthalmology;  Laterality: Right;  DIABETIC - oral meds RIGHT Requests arrival 10AM or after     Social History   Tobacco Use   Smoking status: Former    Current packs/day: 0.00    Types: Cigarettes    Quit date: 04/30/1966    Years since quitting: 56.6   Smokeless tobacco: Never  Substance Use Topics   Alcohol use: No   Drug use: No       Family History  Problem Relation Age of Onset   Heart attack Mother    Diabetes Mother    Heart attack Father    Diabetes Sister      No Known Allergies   REVIEW OF SYSTEMS (Negative unless checked)   Constitutional: [] Weight loss  [] Fever  [] Chills Cardiac: [] Chest pain   [] Chest pressure   [] Palpitations   [] Shortness of breath when laying flat   [] Shortness of breath at rest   [] Shortness of breath with exertion. Vascular:  [] Pain in legs with walking   [] Pain in legs at rest   [] Pain in legs when laying flat   [] Claudication   [] Pain in feet when walking  [] Pain in feet at rest   [] Pain in feet when laying flat   [] History of DVT   [] Phlebitis   [x] Swelling in legs   [] Varicose veins   [x] Non-healing ulcers Pulmonary:   [] Uses home oxygen   []   Productive cough   [] Hemoptysis   [] Wheeze  [] COPD   [] Asthma Neurologic:  [] Dizziness  [] Blackouts   [] Seizures   [] History of stroke   [] History of TIA  [] Aphasia   [] Temporary blindness   [] Dysphagia   [] Weakness or numbness in arms   [] Weakness or numbness in legs Musculoskeletal:  [x] Arthritis   [] Joint swelling   [] Joint pain   [] Low back pain Hematologic:  [] Easy bruising  [] Easy bleeding   [] Hypercoagulable state   [] Anemic   Gastrointestinal:  [] Blood in stool   [] Vomiting blood  [] Gastroesophageal reflux/heartburn   [] Abdominal pain Genitourinary:  [x] Chronic kidney disease   [] Difficult urination  [] Frequent urination  [] Burning with urination   [] Hematuria Skin:  [] Rashes   [x] Ulcers   [x] Wounds Psychological:  [] History of anxiety   []  History of major depression.  Physical Examination  There were no vitals taken for this visit. Gen:  WD/WN, NAD. Appears younger than stated age. Head: [place]/AT, No temporalis wasting. Ear/Nose/Throat: Hearing grossly intact, nares w/o erythema or drainage Eyes: Conjunctiva clear. Sclera non-icteric Neck: Supple.  Trachea midline Pulmonary:  Good air movement, no use of accessory muscles.  Cardiac: irregular Vascular:  Vessel Right Left  Radial Palpable Palpable           Musculoskeletal: M/S 5/5 throughout.  No deformity or atrophy.  Marked stasis dermatitis and hyperpigmentation.  This area is now very smooth with no ulceration or drainage.  Trace bilateral lower extremity edema. Neurologic: Sensation grossly intact in extremities.  Symmetrical.  Speech is fluent.  Psychiatric: Judgment intact, Mood & affect appropriate for pt's clinical situation. Dermatologic: No rashes or ulcers noted.  No cellulitis or open wounds.      Labs No results found for this or any previous  visit (from the past 2160 hour(s)).  Radiology No results found.  Assessment/Plan Essential hypertension blood pressure control important in reducing the progression of atherosclerotic disease. On appropriate oral medications.     Type 2 diabetes mellitus with complication (HCC) blood glucose control important in reducing the progression of atherosclerotic disease. Also, involved in wound healing. On appropriate medications.     Hyperlipidemia lipid control important in reducing the progression of atherosclerotic disease. Continue statin therapy   Heart failure (HCC) Can worsen LE swelling   Lymphedema His legs are doing quite well.  His family really has worked nicely with him and he has stayed diligent about elevation and exercise.  I will see him in 6 months.  Call with any questions or concerns in the interim.   Festus Barren, MD  12/07/2022 10:40 AM    This note was created with Dragon medical transcription system.  Any errors from dictation are purely unintentional

## 2022-12-15 ENCOUNTER — Inpatient Hospital Stay (HOSPITAL_COMMUNITY)
Admission: EM | Admit: 2022-12-15 | Discharge: 2022-12-18 | DRG: 871 | Disposition: A | Discharge status: Home Health | Payer: Medicare PPO | Attending: Internal Medicine | Admitting: Internal Medicine

## 2022-12-15 ENCOUNTER — Encounter (HOSPITAL_COMMUNITY): Payer: Self-pay

## 2022-12-15 ENCOUNTER — Encounter (HOSPITAL_COMMUNITY): Payer: Self-pay | Admitting: Emergency Medicine

## 2022-12-15 ENCOUNTER — Other Ambulatory Visit: Payer: Self-pay

## 2022-12-15 ENCOUNTER — Emergency Department (HOSPITAL_COMMUNITY): Payer: Medicare PPO

## 2022-12-15 DIAGNOSIS — Z87891 Personal history of nicotine dependence: Secondary | ICD-10-CM

## 2022-12-15 DIAGNOSIS — I959 Hypotension, unspecified: Secondary | ICD-10-CM | POA: Diagnosis present

## 2022-12-15 DIAGNOSIS — R6521 Severe sepsis with septic shock: Secondary | ICD-10-CM | POA: Diagnosis present

## 2022-12-15 DIAGNOSIS — N185 Chronic kidney disease, stage 5: Secondary | ICD-10-CM | POA: Diagnosis present

## 2022-12-15 DIAGNOSIS — Z96652 Presence of left artificial knee joint: Secondary | ICD-10-CM | POA: Diagnosis present

## 2022-12-15 DIAGNOSIS — F32A Depression, unspecified: Secondary | ICD-10-CM | POA: Diagnosis present

## 2022-12-15 DIAGNOSIS — N1832 Chronic kidney disease, stage 3b: Secondary | ICD-10-CM | POA: Diagnosis not present

## 2022-12-15 DIAGNOSIS — U071 COVID-19: Secondary | ICD-10-CM | POA: Diagnosis present

## 2022-12-15 DIAGNOSIS — J1282 Pneumonia due to coronavirus disease 2019: Secondary | ICD-10-CM | POA: Diagnosis present

## 2022-12-15 DIAGNOSIS — F03A3 Unspecified dementia, mild, with mood disturbance: Secondary | ICD-10-CM | POA: Diagnosis present

## 2022-12-15 DIAGNOSIS — E1165 Type 2 diabetes mellitus with hyperglycemia: Secondary | ICD-10-CM | POA: Diagnosis present

## 2022-12-15 DIAGNOSIS — K219 Gastro-esophageal reflux disease without esophagitis: Secondary | ICD-10-CM | POA: Diagnosis present

## 2022-12-15 DIAGNOSIS — A4189 Other specified sepsis: Principal | ICD-10-CM | POA: Diagnosis present

## 2022-12-15 DIAGNOSIS — Z6830 Body mass index (BMI) 30.0-30.9, adult: Secondary | ICD-10-CM

## 2022-12-15 DIAGNOSIS — E118 Type 2 diabetes mellitus with unspecified complications: Secondary | ICD-10-CM | POA: Diagnosis present

## 2022-12-15 DIAGNOSIS — Z8249 Family history of ischemic heart disease and other diseases of the circulatory system: Secondary | ICD-10-CM | POA: Diagnosis not present

## 2022-12-15 DIAGNOSIS — N179 Acute kidney failure, unspecified: Secondary | ICD-10-CM | POA: Diagnosis present

## 2022-12-15 DIAGNOSIS — E669 Obesity, unspecified: Secondary | ICD-10-CM | POA: Diagnosis present

## 2022-12-15 DIAGNOSIS — E1122 Type 2 diabetes mellitus with diabetic chronic kidney disease: Secondary | ICD-10-CM | POA: Diagnosis present

## 2022-12-15 DIAGNOSIS — Z7984 Long term (current) use of oral hypoglycemic drugs: Secondary | ICD-10-CM

## 2022-12-15 DIAGNOSIS — D696 Thrombocytopenia, unspecified: Secondary | ICD-10-CM | POA: Diagnosis present

## 2022-12-15 DIAGNOSIS — I10 Essential (primary) hypertension: Secondary | ICD-10-CM | POA: Diagnosis not present

## 2022-12-15 DIAGNOSIS — E7800 Pure hypercholesterolemia, unspecified: Secondary | ICD-10-CM | POA: Diagnosis present

## 2022-12-15 DIAGNOSIS — H409 Unspecified glaucoma: Secondary | ICD-10-CM | POA: Diagnosis present

## 2022-12-15 DIAGNOSIS — Z8616 Personal history of COVID-19: Secondary | ICD-10-CM

## 2022-12-15 DIAGNOSIS — Z7982 Long term (current) use of aspirin: Secondary | ICD-10-CM

## 2022-12-15 DIAGNOSIS — E1142 Type 2 diabetes mellitus with diabetic polyneuropathy: Secondary | ICD-10-CM | POA: Diagnosis present

## 2022-12-15 DIAGNOSIS — Z8679 Personal history of other diseases of the circulatory system: Secondary | ICD-10-CM

## 2022-12-15 DIAGNOSIS — R531 Weakness: Secondary | ICD-10-CM | POA: Diagnosis present

## 2022-12-15 DIAGNOSIS — Z833 Family history of diabetes mellitus: Secondary | ICD-10-CM

## 2022-12-15 DIAGNOSIS — Z9842 Cataract extraction status, left eye: Secondary | ICD-10-CM

## 2022-12-15 DIAGNOSIS — N400 Enlarged prostate without lower urinary tract symptoms: Secondary | ICD-10-CM | POA: Diagnosis present

## 2022-12-15 DIAGNOSIS — A419 Sepsis, unspecified organism: Secondary | ICD-10-CM | POA: Diagnosis present

## 2022-12-15 DIAGNOSIS — Z961 Presence of intraocular lens: Secondary | ICD-10-CM | POA: Diagnosis present

## 2022-12-15 DIAGNOSIS — E861 Hypovolemia: Secondary | ICD-10-CM

## 2022-12-15 DIAGNOSIS — Z79899 Other long term (current) drug therapy: Secondary | ICD-10-CM

## 2022-12-15 DIAGNOSIS — J9601 Acute respiratory failure with hypoxia: Secondary | ICD-10-CM | POA: Diagnosis present

## 2022-12-15 DIAGNOSIS — J9690 Respiratory failure, unspecified, unspecified whether with hypoxia or hypercapnia: Secondary | ICD-10-CM | POA: Diagnosis not present

## 2022-12-15 DIAGNOSIS — F03A4 Unspecified dementia, mild, with anxiety: Secondary | ICD-10-CM | POA: Diagnosis present

## 2022-12-15 DIAGNOSIS — M79661 Pain in right lower leg: Secondary | ICD-10-CM | POA: Diagnosis not present

## 2022-12-15 DIAGNOSIS — R350 Frequency of micturition: Secondary | ICD-10-CM | POA: Diagnosis not present

## 2022-12-15 DIAGNOSIS — I1311 Hypertensive heart and chronic kidney disease without heart failure, with stage 5 chronic kidney disease, or end stage renal disease: Secondary | ICD-10-CM | POA: Diagnosis present

## 2022-12-15 DIAGNOSIS — R0603 Acute respiratory distress: Secondary | ICD-10-CM | POA: Diagnosis not present

## 2022-12-15 DIAGNOSIS — N401 Enlarged prostate with lower urinary tract symptoms: Secondary | ICD-10-CM | POA: Diagnosis not present

## 2022-12-15 LAB — CBC
HCT: 34.8 % — ABNORMAL LOW (ref 39.0–52.0)
Hemoglobin: 11.1 g/dL — ABNORMAL LOW (ref 13.0–17.0)
MCH: 33.1 pg (ref 26.0–34.0)
MCHC: 31.9 g/dL (ref 30.0–36.0)
MCV: 103.9 fL — ABNORMAL HIGH (ref 80.0–100.0)
Platelets: 141 10*3/uL — ABNORMAL LOW (ref 150–400)
RBC: 3.35 MIL/uL — ABNORMAL LOW (ref 4.22–5.81)
RDW: 12.8 % (ref 11.5–15.5)
WBC: 10.9 10*3/uL — ABNORMAL HIGH (ref 4.0–10.5)
nRBC: 0 % (ref 0.0–0.2)

## 2022-12-15 LAB — URINALYSIS, ROUTINE W REFLEX MICROSCOPIC
Bilirubin Urine: NEGATIVE
Glucose, UA: 50 mg/dL — AB
Hgb urine dipstick: NEGATIVE
Ketones, ur: 5 mg/dL — AB
Leukocytes,Ua: NEGATIVE
Nitrite: NEGATIVE
Protein, ur: 30 mg/dL — AB
Specific Gravity, Urine: 1.015 (ref 1.005–1.030)
pH: 5 (ref 5.0–8.0)

## 2022-12-15 LAB — COMPREHENSIVE METABOLIC PANEL
ALT: 12 U/L (ref 0–44)
AST: 20 U/L (ref 15–41)
Albumin: 3.4 g/dL — ABNORMAL LOW (ref 3.5–5.0)
Alkaline Phosphatase: 54 U/L (ref 38–126)
Anion gap: 11 (ref 5–15)
BUN: 29 mg/dL — ABNORMAL HIGH (ref 8–23)
CO2: 22 mmol/L (ref 22–32)
Calcium: 8.5 mg/dL — ABNORMAL LOW (ref 8.9–10.3)
Chloride: 103 mmol/L (ref 98–111)
Creatinine, Ser: 1.97 mg/dL — ABNORMAL HIGH (ref 0.61–1.24)
GFR, Estimated: 31 mL/min — ABNORMAL LOW (ref >60)
Glucose, Bld: 242 mg/dL — ABNORMAL HIGH (ref 70–99)
Potassium: 4 mmol/L (ref 3.5–5.1)
Sodium: 136 mmol/L (ref 135–145)
Total Bilirubin: 0.8 mg/dL (ref 0.3–1.2)
Total Protein: 6.6 g/dL (ref 6.5–8.1)

## 2022-12-15 LAB — MRSA NEXT GEN BY PCR, NASAL: MRSA by PCR Next Gen: NOT DETECTED

## 2022-12-15 LAB — GLUCOSE, CAPILLARY
Glucose-Capillary: 284 mg/dL — ABNORMAL HIGH (ref 70–99)
Glucose-Capillary: 232 mg/dL — ABNORMAL HIGH (ref 70–99)

## 2022-12-15 LAB — PROCALCITONIN: Procalcitonin: 0.27 ng/mL

## 2022-12-15 LAB — CBG MONITORING, ED: Glucose-Capillary: 195 mg/dL — ABNORMAL HIGH (ref 70–99)

## 2022-12-15 LAB — HEMOGLOBIN A1C
Hgb A1c MFr Bld: 6.3 % — ABNORMAL HIGH (ref 4.8–5.6)
Mean Plasma Glucose: 134.11 mg/dL

## 2022-12-15 LAB — LACTIC ACID, PLASMA: Lactic Acid, Venous: 2.9 mmol/L (ref 0.5–1.9)

## 2022-12-15 LAB — TROPONIN I (HIGH SENSITIVITY)
Troponin I (High Sensitivity): 3 ng/L (ref <18)
Troponin I (High Sensitivity): 3 ng/L (ref <18)

## 2022-12-15 LAB — SARS CORONAVIRUS 2 BY RT PCR: SARS Coronavirus 2 by RT PCR: POSITIVE — AB

## 2022-12-15 MED ORDER — INSULIN ASPART 100 UNIT/ML IJ SOLN
0.0000 [IU] | INTRAMUSCULAR | Status: DC
  Administered 2022-12-15: 0.3 [IU] via SUBCUTANEOUS
  Administered 2022-12-15: 5 [IU] via SUBCUTANEOUS
  Administered 2022-12-16: 3 [IU] via SUBCUTANEOUS
  Administered 2022-12-16: 2 [IU] via SUBCUTANEOUS
  Administered 2022-12-16: 1 [IU] via SUBCUTANEOUS
  Administered 2022-12-16: 3 [IU] via SUBCUTANEOUS
  Administered 2022-12-16: 2 [IU] via SUBCUTANEOUS
  Administered 2022-12-17: 5 [IU] via SUBCUTANEOUS
  Administered 2022-12-17 (×3): 2 [IU] via SUBCUTANEOUS

## 2022-12-15 MED ORDER — LACTATED RINGERS IV BOLUS
1000.0000 mL | Freq: Once | INTRAVENOUS | Status: AC
  Administered 2022-12-15: 1000 mL via INTRAVENOUS

## 2022-12-15 MED ORDER — ACETAMINOPHEN 325 MG PO TABS
650.0000 mg | ORAL_TABLET | Freq: Four times a day (QID) | ORAL | Status: DC | PRN
  Administered 2022-12-16: 650 mg via ORAL
  Filled 2022-12-15: qty 2

## 2022-12-15 MED ORDER — SIMVASTATIN 20 MG PO TABS
10.0000 mg | ORAL_TABLET | Freq: Every day | ORAL | Status: DC
  Administered 2022-12-15 – 2022-12-17 (×3): 10 mg via ORAL
  Filled 2022-12-15: qty 1
  Filled 2022-12-15 (×2): qty 2
  Filled 2022-12-15: qty 1

## 2022-12-15 MED ORDER — CHLORHEXIDINE GLUCONATE CLOTH 2 % EX PADS
6.0000 | MEDICATED_PAD | Freq: Every day | CUTANEOUS | Status: DC
  Administered 2022-12-15 – 2022-12-17 (×3): 6 via TOPICAL

## 2022-12-15 MED ORDER — ALPRAZOLAM 0.5 MG PO TABS
0.5000 mg | ORAL_TABLET | Freq: Two times a day (BID) | ORAL | Status: DC | PRN
  Administered 2022-12-15: 0.5 mg via ORAL
  Filled 2022-12-15: qty 1

## 2022-12-15 MED ORDER — SODIUM CHLORIDE 0.9 % IV SOLN
200.0000 mg | Freq: Once | INTRAVENOUS | Status: AC
  Administered 2022-12-15: 200 mg via INTRAVENOUS
  Filled 2022-12-15: qty 40

## 2022-12-15 MED ORDER — DOCUSATE SODIUM 100 MG PO CAPS: 100.0000 mg | ORAL_CAPSULE | Freq: Two times a day (BID) | ORAL | Status: DC | PRN

## 2022-12-15 MED ORDER — FINASTERIDE 5 MG PO TABS
5.0000 mg | ORAL_TABLET | Freq: Every day | ORAL | Status: DC
  Administered 2022-12-16 – 2022-12-18 (×3): 5 mg via ORAL
  Filled 2022-12-15 (×3): qty 1

## 2022-12-15 MED ORDER — POLYETHYLENE GLYCOL 3350 17 G PO PACK: 17.0000 g | PACK | Freq: Every day | ORAL | Status: DC | PRN

## 2022-12-15 MED ORDER — SODIUM CHLORIDE 0.9 % IV SOLN
100.0000 mg | Freq: Every day | INTRAVENOUS | Status: AC
  Administered 2022-12-16 – 2022-12-17 (×2): 100 mg via INTRAVENOUS
  Filled 2022-12-15 (×2): qty 20

## 2022-12-15 MED ORDER — PANTOPRAZOLE SODIUM 40 MG PO TBEC
40.0000 mg | DELAYED_RELEASE_TABLET | Freq: Every day | ORAL | Status: DC
  Administered 2022-12-15 – 2022-12-18 (×4): 40 mg via ORAL
  Filled 2022-12-15 (×4): qty 1

## 2022-12-15 MED ORDER — ASPIRIN 81 MG PO TBEC
81.0000 mg | DELAYED_RELEASE_TABLET | Freq: Every day | ORAL | Status: DC
  Administered 2022-12-16 – 2022-12-18 (×3): 81 mg via ORAL
  Filled 2022-12-15 (×3): qty 1

## 2022-12-15 MED ORDER — HEPARIN SODIUM (PORCINE) 5000 UNIT/ML IJ SOLN
5000.0000 [IU] | Freq: Three times a day (TID) | INTRAMUSCULAR | Status: DC
  Administered 2022-12-16 – 2022-12-17 (×5): 5000 [IU] via SUBCUTANEOUS
  Filled 2022-12-15 (×5): qty 1

## 2022-12-15 MED ORDER — DEXAMETHASONE SODIUM PHOSPHATE 10 MG/ML IJ SOLN
6.0000 mg | INTRAMUSCULAR | Status: DC
  Administered 2022-12-15: 6 mg via INTRAVENOUS
  Filled 2022-12-15: qty 0.6

## 2022-12-15 MED ORDER — NOREPINEPHRINE 4 MG/250ML-% IV SOLN
INTRAVENOUS | Status: AC
  Administered 2022-12-15: 4 mg via INTRAVENOUS
  Filled 2022-12-15: qty 250

## 2022-12-15 MED ORDER — SODIUM CHLORIDE 0.9 % IV SOLN
2.0000 g | Freq: Once | INTRAVENOUS | Status: AC
  Administered 2022-12-15: 2 g via INTRAVENOUS
  Filled 2022-12-15: qty 12.5

## 2022-12-15 MED ORDER — SERTRALINE HCL 100 MG PO TABS
100.0000 mg | ORAL_TABLET | Freq: Every day | ORAL | Status: DC
  Administered 2022-12-16 – 2022-12-18 (×3): 100 mg via ORAL
  Filled 2022-12-15: qty 1
  Filled 2022-12-15: qty 2
  Filled 2022-12-15: qty 1

## 2022-12-15 MED ORDER — NOREPINEPHRINE 4 MG/250ML-% IV SOLN
0.0000 ug/min | INTRAVENOUS | Status: DC
  Filled 2022-12-15: qty 250

## 2022-12-15 MED ORDER — DEXAMETHASONE 4 MG PO TABS
6.0000 mg | ORAL_TABLET | ORAL | Status: DC
  Administered 2022-12-16 – 2022-12-17 (×2): 6 mg via ORAL
  Filled 2022-12-15: qty 2
  Filled 2022-12-15: qty 1
  Filled 2022-12-15: qty 2
  Filled 2022-12-15: qty 1

## 2022-12-15 MED ORDER — PHENYLEPHRINE 80 MCG/ML (10ML) SYRINGE FOR IV PUSH (FOR BLOOD PRESSURE SUPPORT): 80.0000 ug | PREFILLED_SYRINGE | Freq: Once | INTRAVENOUS | Status: AC

## 2022-12-15 MED ORDER — PHENYLEPHRINE 80 MCG/ML (10ML) SYRINGE FOR IV PUSH (FOR BLOOD PRESSURE SUPPORT)
PREFILLED_SYRINGE | INTRAVENOUS | Status: AC
  Administered 2022-12-15: 80 ug via INTRAVENOUS
  Filled 2022-12-15: qty 10

## 2022-12-15 NOTE — H&P (Addendum)
NAME:  Tim Henry, MRN:  [id number], DOB:  [birth year], LOS: 0 ADMISSION DATE:  12/15/2022 CONSULTATION DATE:  12/15/2022 REFERRING MD:  Jearld Fenton - EDP ([facility]) CHIEF COMPLAINT:  Hypotension, COVID-19 infection  History of Present Illness:  89 year old man who presented to [facility] 8/17 as a transfer from [facility] for hypotension, suspected sepsis in the setting of COVID-19 infection. PMHx significant for HTN, HLD, T2DM, SDH (2015, s/p fall requiring craniotomy), GERD, anxiety/depression, mild dementia. Recently seen in ED 7/1 for mechanical fall with which he hit his head; CT Head NAICA and he was discharged home.  Patient presented to [facility] 8/17 with generalized weakness/lethargy. Tested positive for COVID 8/16. Initially reported cough, but this was improved with cough medicine. Denies fever/chills, CP/SOB, n/v/d abdominal pain. Previously diagnosed with COVID 04/2022 with similar presentation, though had worse n/v/d at that time.  On ED arrival, patient was afebrile with HR 50s, BP 70s/40s, RR 18, SpO2 90s. Labs were notable for WB 10.9, Hgb 11.1, Plt 141. Na 136, K 4.0, CO2 22, Cr 1.97 (baseline 1.4-1.8), LFTs WNL. Trop WNL. LA 2.9.  Hyperglycemic to mid-200s. COVID +. CXR demonstrated cardiomegaly and low lung volumes, no acute process. Despite fluid resuscitation, patient remained hypotensive prompting vasopressor initiation. L femoral CVC was placed by EDP at [facility]. BCx were obtained and empiric broad-spectrum antibiotics started in the setting of presumed sepsis.  PCCM was consulted for ICU admission/transfer to [facility].  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 COVID+. Hypotensive with SBP 70s despite fluid resuscitation prompting vasopressor initiation. L fem CVC placed. Broad-spectrum antibiotics initiated. Transferred to [facility] for further care.  Interim History / Subjective:  PCCM consulted for ICU admission and transfer.  Objective:  Blood pressure (!) 114/45, pulse (!) 52, temperature 97.7 F (36.5 C), temperature source Oral, resp. rate 15, weight 102 kg, SpO2 96%.       No intake or output data in the 24 hours ending 12/15/22 2103 Filed Weights   12/15/22 1557  Weight: 102 kg   Physical Examination: General: Acutely ill-appearing elderly man in NAD. Pleasant and conversant. HEENT: [place]/AT, anicteric sclera, pupils equal/reactive, eyes cloudy, moist mucous membranes. Neuro: Awake, oriented x 3. Responds to verbal stimuli. Following commands consistently. Moves all 4 extremities spontaneously. Strength 4/5 in all 4 extremities.  CV: RRR, no m/g/r. PULM: Breathing even and unlabored on [place]. Lung fields CTAB. GI: Soft, nontender, nondistended. Extremities: Bilateral symmetric 1+ LE/pedal edema noted. Skin: Warm/dry, BLE lower leg skin changes c/w chronic venous stasis, mildly TTP.  Resolved Hospital Problem List:    Assessment & Plan:  Presumed septic shock in the setting of COVID-19 infection - Admit to ICU for close monitoring - Goal MAP > 65 -  Fluid resuscitation as tolerated - Levophed titrated to goal MAP - Trend WBC, fever curve, LA - F/u Cx data, urine studies - Continue broad-spectrum antibiotics (cefepime)  COVID-19 infection Tested positive for COVID as outpatient 8/16. Previously tested positive 04/2022  with similar presentation. - Supplemental O2 support as needed; fortunately has not been hypoxic or had significant respiratory symptoms - Wean O2 for sat > 90% - Paxlovid as outpatient, hold inpatient - Start remdesivir - Decadron 6mg  daily x 10days - Pulmonary hygiene - Follow CXR  HTN HLD - Hold home antihypertensives in the setting of vasopressor needs - Continue Tim/statin - Hold home Lasix for now  T2DM - SSI - CBGs Q4H - Goal CBG 140-180 - Hold home Januvia, metformin  GERD - PPI  History of SDH s/p craniotomy 2015 Seizure s/p SDH, none since prior admission - Baseline mentation at present - Neuroprotective measures: HOB > 30 degrees, normoglycemia, normothermia, electrolytes WNL  Anxiety Depression - Resume home Zoloft, Xanax PRN  BPH - Continue finasteride  Best Practice: (right click and "Reselect all SmartList Selections" daily)   Diet/type: NPO DVT prophylaxis: SCDs, SQH GI prophylaxis: PPI Lines: Central line Foley:  N/A Code Status:  full code Last date of multidisciplinary goals of care discussion [Pending]  Labs:  CBC: Recent Labs  Lab 12/15/22 1544  WBC 10.9*  HGB 11.1*  HCT 34.8*  MCV 103.9*  PLT 141*   Basic Metabolic Panel: Recent Labs  Lab 12/15/22 1548  NA 136  K 4.0  CL 103  CO2 22  GLUCOSE 242*  BUN 29*  CREATININE 1.97*  CALCIUM 8.5*   GFR: Estimated Creatinine Clearance: 28.5 mL/min (A) (by C-G formula based on SCr of 1.97 mg/dL (H)). Recent Labs  Lab 12/15/22 1544 12/15/22 1812  WBC 10.9*  --   LATICACIDVEN  --  2.9*   Liver Function Tests: Recent Labs  Lab 12/15/22 1548  AST 20  ALT 12  ALKPHOS 54  BILITOT 0.8  PROT 6.6  ALBUMIN 3.4*   No results for input(s): "LIPASE", "AMYLASE" in the last 168 hours. No results for input(s): "AMMONIA" in the last 168 hours.  ABG:    Component Value Date/Time   HCO3 28.1 (H) 05/19/2022 1833   TCO2 25 05/19/2022 1835   O2SAT 26.1 05/19/2022 1833     Coagulation Profile: No results for input(s): "INR", "PROTIME" in the last 168 hours.  Cardiac Enzymes: No results for input(s): "CKTOTAL", "CKMB", "CKMBINDEX", "TROPONINI" in the last 168 hours.  HbA1C: Hgb A1c MFr Bld  Date/Time Value Ref Range Status  03/03/2022 07:45 PM 6.3 (H) 4.8 - 5.6 % Final    Comment:    (NOTE) Pre diabetes:          5.7%-6.4%  Diabetes:              >6.4%  Glycemic control for   <7.0% adults with diabetes    CBG: Recent Labs  Lab 12/15/22 1624  GLUCAP 195*   Review of Systems:   Review of Systems  Constitutional:  Positive for malaise/fatigue. Negative for chills, fever and weight loss.  HENT:  Positive for congestion. Negative for sore throat.   Respiratory:  Positive for cough. Negative for hemoptysis and shortness of breath.   Cardiovascular:  Positive for leg swelling. Negative for chest pain and palpitations.  Gastrointestinal:  Negative for abdominal pain, diarrhea, nausea and vomiting.  Musculoskeletal:  Negative for myalgias.  Neurological:  Positive for weakness.   Past Medical History:  He,  has a past medical history of Anemia, Anxiety, Dementia (HCC), Depression, Diabetes mellitus without complication (HCC), Full dentures, GERD (gastroesophageal reflux disease), Hypercholesteremia, Hypertension, SDH (subdural hematoma) (HCC) (2015), and 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).   Surgical History:   Past Surgical History:  Procedure Laterality Date   APPENDECTOMY     childhood   BACK SURGERY  1963   CATARACT EXTRACTION W/PHACO Left 09/15/2014   Procedure: CATARACT EXTRACTION PHACO AND INTRAOCULAR LENS PLACEMENT (IOC);  Surgeon: Lockie Mola, MD;  Location: [facility];  Service: Ophthalmology;  Laterality: Left;  DIABETIC PT WOULD LIKE TO HAVE ARRIVAL TIME LATE AM   JOINT REPLACEMENT Left    knee   PHOTOCOAGULATION WITH LASER Right 01/23/2016   Procedure: PHOTOCOAGULATION WITH LASER;  Surgeon: Sherald Hess,  MD;  Location: [facility];  Service: Ophthalmology;  Laterality: Right;  DIABETIC - oral meds RIGHT Requests arrival 10AM or after   Social History:   reports that he quit smoking about 56 years ago. His smoking use included cigarettes. He has never used smokeless tobacco. He reports that he does not drink alcohol and does not use drugs.   Family History:  His family history includes Diabetes in his mother and sister; Heart attack in his father and mother.   Allergies: No Known Allergies   Home Medications: Prior to Admission medications   Medication Sig Start Date End Date Taking? Authorizing Provider  acetaminophen (TYLENOL) 500 MG tablet Take 500 mg by mouth 2 (two) times daily as needed for fever, headache, moderate pain or mild pain.   Yes [provider]  ALPRAZolam Prudy Feeler) 0.5 MG tablet Take 0.5 mg by mouth 2 (two) times daily.   Yes [provider]  aspirin EC 81 MG tablet Take 1 tablet (81 mg total) by mouth daily with breakfast. Swallow whole. Patient taking differently: Take 81 mg by mouth daily with breakfast. 03/06/22  Yes Emokpae, Courage, MD  BLACK ELDERBERRY PO Take 1 capsule by mouth at bedtime.   Yes [provider]  carvedilol (COREG) 6.25 MG tablet Take 6.25 mg by mouth 2 (two) times daily with a meal.  11/20/17  Yes [provider]  diphenhydrAMINE (BENADRYL) 25 MG tablet Take 25 mg by mouth at bedtime.   Yes [provider]  ferrous sulfate 325 (65 FE) MG tablet Take 325 mg by mouth at bedtime.   Yes [provider]  finasteride (PROSCAR) 5 MG tablet Take 1 tablet (5 mg total) by mouth daily. 04/05/22  Yes Stoioff, Verna Czech, MD  furosemide (LASIX) 20 MG tablet Take 1 tablet (20 mg total) by mouth daily. Patient taking differently: Take 20-40 mg by mouth daily. 03/06/22  Yes Emokpae, Courage, MD  gabapentin (NEURONTIN) 300 MG capsule Take 300 mg by mouth 2 (two) times daily with a meal. 1300, 2300   Yes  [provider]  Glucosamine HCl (GLUCOSAMINE PO) Take 2 tablets by mouth at bedtime.   Yes [provider]  metFORMIN (GLUCOPHAGE) 500 MG tablet Take 1 tablet (500 mg total) by mouth 2 (two) times daily with a meal. 1 PM, 11 PM Patient taking differently: Take 500 mg by mouth 2 (two) times daily with a meal. 1300, 2300. 03/06/22  Yes Emokpae, Courage, MD  Multiple Vitamins-Minerals (MULTIVITAMIN WITH MINERALS) tablet Take 1 tablet by mouth at bedtime.   Yes [provider]  nirmatrelvir & ritonavir (PAXLOVID, 300/100,) 20 x 150 MG & 10 x 100MG  TBPK Take 1 tablet by  mouth See admin instructions. TAKE AS DIRECTED PER PACKAGE INSTRUCTIONS STARTING ON 12/14/2022 X 5DAYS   Yes [provider]  OIL OF OREGANO PO Take 1 capsule by mouth at bedtime.   Yes [provider]  omeprazole (PRILOSEC) 40 MG capsule Take 40 mg by mouth daily. 02/07/18  Yes [provider]  PHOSPHATIDYL CHOLINE PO Take 2 tablets by mouth at bedtime.   Yes [provider]  potassium chloride (K-DUR) 10 MEQ tablet Take 20 mEq by mouth daily.   Yes [provider]  Probiotic Product (PROBIOTIC DAILY PO) Take 1 capsule by mouth at bedtime.   Yes [provider]  sertraline (ZOLOFT) 100 MG tablet Take 100 mg by mouth daily.   Yes [provider]  simvastatin (ZOCOR) 10 MG tablet Take 10 mg by mouth at bedtime.   Yes [provider]  sitaGLIPtin (JANUVIA) 100 MG tablet Take 100 mg by mouth daily.   Yes [provider]  SPIRULINA PO Take 1 tablet by mouth at bedtime.   Yes [provider]  sucralfate (CARAFATE) 1 g tablet Take 1 g by mouth 2 (two) times daily.   Yes [provider]  tamsulosin (FLOMAX) 0.4 MG CAPS capsule Take 1 capsule (0.4 mg total) by mouth daily after breakfast. 1 PM Patient taking differently: Take 0.4 mg by mouth daily after breakfast. 1300 04/05/22  Yes Stoioff, Verna Czech, MD  telmisartan  (MICARDIS) 20 MG tablet Take 20 mg by mouth daily. 11/20/17  Yes [provider]  verapamil (CALAN-SR) 240 MG CR tablet Take 240 mg by mouth daily.   Yes [provider]  Vitamin D, Ergocalciferol, (DRISDOL) 1.25 MG (50000 UT) CAPS capsule Take 50,000 Units by mouth every Wednesday. 02/07/18  Yes [provider]   Critical care time:    The patient is critically ill with multiple organ system failure and requires high complexity decision making for assessment and support, frequent evaluation and titration of therapies, advanced monitoring, review of radiographic studies and interpretation of complex data.   Critical Care Time devoted to patient care services, exclusive of separately billable procedures, described in this note is 38 minutes.  Tim Lair, PA-C [facility] 12/15/22 9:03 PM  Please see [email or web address] for pager details.  From 7A-7P if no response, please call [phone] After hours, please call ELink [phone]

## 2022-12-15 NOTE — ED Notes (Signed)
EDP at bedside discussing central line and ICU transfer to [facility] with patient and daughter. Daughter asked questions that were answered by provider and gave consent for procedure.

## 2022-12-15 NOTE — ED Notes (Signed)
One set of cultures obtained by phlebotomy, unable to obtain second set at this time.

## 2022-12-15 NOTE — ED Provider Notes (Signed)
[place] EMERGENCY DEPARTMENT AT [facility] Provider Note   CSN: [id number] Arrival date & time: 12/15/22  1517     History  Chief Complaint  Patient presents with   Weakness   Hypotension    Tim Henry is a 89 y.o. male with PMH as listed below who presents with gen weakness.   Patient presents with hypotension into the 60s over 40s and a positive COVID test yesterday.  Patient is oriented x 4 but is very sleepy on exam and interview.  He is slow to respond and difficult to obtain a history.  Patient denies any pain including chest pain, shortness of breath, abdominal pain.  He states that he feels extremely weak since yesterday being diagnosed with COVID.  He states he did have a cough but received cough medicine and has not been coughing since then.  Denies any fevers or chills.  Lives with his children who called EMS for severe weakness and lethargy.  Per chart review patient actually did extremely similar presentation in January 2024.  He was diagnosed with COVID the day prior and had nausea vomiting diarrhea as well as a syncopal episode. Was found to be hypotensive on arrival and required vasopressor support in the emergency department.  CT at that time was notable for colitis.  Had septic shock.  AKI improved with IV fluids and he received 3-day course of IV remdesivir, also treated empirically for colitis with cefepime and Flagyl.   Past Medical History:  Diagnosis Date   Anemia    in past   Anxiety    Dementia (HCC)    able to sign own papers   Depression    Diabetes mellitus without complication (HCC)    Full dentures    upper and lower   GERD (gastroesophageal reflux disease)    Head trauma    admitted to [facility], "pint of blood removed from head", no deficits after   Hypercholesteremia    Hypertension    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)    none since release from hosp       Home Medications Prior to Admission medications   Medication Sig Start  Date End Date Taking? Authorizing Provider  acetaminophen (TYLENOL) 500 MG tablet Take 500 mg by mouth 2 (two) times daily as needed for fever, headache, moderate pain or mild pain.   Yes [provider]  ALPRAZolam Prudy Feeler) 0.5 MG tablet Take 0.5 mg by mouth 2 (two) times daily.   Yes [provider]  aspirin EC 81 MG tablet Take 1 tablet (81 mg total) by mouth daily with breakfast. Swallow whole. Patient taking differently: Take 81 mg by mouth daily with breakfast. 03/06/22  Yes Emokpae, Courage, MD  BLACK ELDERBERRY PO Take 1 capsule by mouth at bedtime.   Yes [provider]  carvedilol (COREG) 6.25 MG tablet Take 6.25 mg by mouth 2 (two) times daily with a meal.  11/20/17  Yes [provider]  diphenhydrAMINE (BENADRYL) 25 MG tablet Take 25 mg by mouth at bedtime.   Yes [provider]  ferrous sulfate 325 (65 FE) MG tablet Take 325 mg by mouth at bedtime.   Yes [provider]  finasteride (PROSCAR) 5 MG tablet Take 1 tablet (5 mg total) by mouth daily. 04/05/22  Yes Stoioff, Verna Czech, MD  furosemide (LASIX) 20 MG tablet Take 1 tablet (20 mg total) by mouth daily. Patient taking differently: Take 20-40 mg by mouth daily. 03/06/22  Yes Emokpae, Courage,  MD  gabapentin (NEURONTIN) 300 MG capsule Take 300 mg by mouth 2 (two) times daily with a meal. 1300, 2300   Yes [provider]  Glucosamine HCl (GLUCOSAMINE PO) Take 2 tablets by mouth at bedtime.   Yes [provider]  metFORMIN (GLUCOPHAGE) 500 MG tablet Take 1 tablet (500 mg total) by mouth 2 (two) times daily with a meal. 1 PM, 11 PM Patient taking differently: Take 500 mg by mouth 2 (two) times daily with a meal. 1300, 2300. 03/06/22  Yes Emokpae, Courage, MD  Multiple Vitamins-Minerals (MULTIVITAMIN WITH MINERALS) tablet Take 1 tablet by mouth at bedtime.   Yes [provider]  nirmatrelvir & ritonavir (PAXLOVID, 300/100,) 20 x 150 MG & 10 x 100MG  TBPK Take 1  tablet by mouth See admin instructions. TAKE AS DIRECTED PER PACKAGE INSTRUCTIONS STARTING ON 12/14/2022 X 5DAYS   Yes [provider]  OIL OF OREGANO PO Take 1 capsule by mouth at bedtime.   Yes [provider]  omeprazole (PRILOSEC) 40 MG capsule Take 40 mg by mouth daily. 02/07/18  Yes [provider]  PHOSPHATIDYL CHOLINE PO Take 2 tablets by mouth at bedtime.   Yes [provider]  potassium chloride (K-DUR) 10 MEQ tablet Take 20 mEq by mouth daily.   Yes [provider]  Probiotic Product (PROBIOTIC DAILY PO) Take 1 capsule by mouth at bedtime.   Yes [provider]  sertraline (ZOLOFT) 100 MG tablet Take 100 mg by mouth daily.   Yes [provider]  simvastatin (ZOCOR) 10 MG tablet Take 10 mg by mouth at bedtime.   Yes [provider]  sitaGLIPtin (JANUVIA) 100 MG tablet Take 100 mg by mouth daily.   Yes [provider]  SPIRULINA PO Take 1 tablet by mouth at bedtime.   Yes [provider]  sucralfate (CARAFATE) 1 g tablet Take 1 g by mouth 2 (two) times daily.   Yes [provider]  tamsulosin (FLOMAX) 0.4 MG CAPS capsule Take 1 capsule (0.4 mg total) by mouth daily after breakfast. 1 PM Patient taking differently: Take 0.4 mg by mouth daily after breakfast. 1300 04/05/22  Yes Stoioff, Verna Czech, MD  telmisartan (MICARDIS) 20 MG tablet Take 20 mg by mouth daily. 11/20/17  Yes [provider]  verapamil (CALAN-SR) 240 MG CR tablet Take 240 mg by mouth daily.   Yes [provider]  Vitamin D, Ergocalciferol, (DRISDOL) 1.25 MG (50000 UT) CAPS capsule Take 50,000 Units by mouth every Wednesday. 02/07/18  Yes [provider]      Allergies    Patient has no known allergies.    Review of Systems   Review of Systems A 10 point review of systems was performed and is negative unless otherwise reported in HPI.  Physical Exam Updated Vital Signs BP (!) 142/63   Pulse (!)  59   Temp 98.4 F (36.9 C)   Resp 14   Wt 102 kg   SpO2 98%   BMI 31.36 kg/m  Physical Exam General: chronically and acute ill- appearing elderly male, lying in bed. Eyes closed. HEENT: PERRLA, Sclera anicteric, MMM, trachea midline.  Cardiology: RRR, no murmurs/rubs/gallops. BL radial and DP pulses equal bilaterally.  Resp: Normal respiratory rate and effort. CTAB, no wheezes, rhonchi, crackles. Intermittently coughing on exam.  Abd: Soft, non-tender, non-distended. No rebound tenderness or guarding.  GU: Deferred. MSK: No peripheral edema or signs of trauma. Extremities without deformity or TTP. No cyanosis or clubbing. Skin:  warm, dry.  Neuro: Orientedx4 but very sleepy, CNs II-XII grossly intact. MAEs. Sensation grossly intact.  Psych: Slow to respond, sleepy  ED Results / Procedures / Treatments   Labs (all labs ordered are listed, but only abnormal results are displayed) Labs Reviewed  SARS CORONAVIRUS 2 BY RT PCR - Abnormal; Notable for the following components:      Result Value   SARS Coronavirus 2 by RT PCR POSITIVE (*)    All other components within normal limits  CBC - Abnormal; Notable for the following components:   WBC 10.9 (*)    RBC 3.35 (*)    Hemoglobin 11.1 (*)    HCT 34.8 (*)    MCV 103.9 (*)    Platelets 141 (*)    All other components within normal limits  COMPREHENSIVE METABOLIC PANEL - Abnormal; Notable for the following components:   Glucose, Bld 242 (*)    BUN 29 (*)    Creatinine, Ser 1.97 (*)    Calcium 8.5 (*)    Albumin 3.4 (*)    GFR, Estimated 31 (*)    All other components within normal limits  LACTIC ACID, PLASMA - Abnormal; Notable for the following components:   Lactic Acid, Venous 2.9 (*)    All other components within normal limits  CBG MONITORING, ED - Abnormal; Notable for the following components:   Glucose-Capillary 195 (*)    All other components within normal limits  CULTURE, BLOOD (ROUTINE X 2)  CULTURE, BLOOD (ROUTINE X  2)  URINALYSIS, ROUTINE W REFLEX MICROSCOPIC  LACTIC ACID, PLASMA  CBG MONITORING, ED  TROPONIN I (HIGH SENSITIVITY)  TROPONIN I (HIGH SENSITIVITY)    EKG EKG Interpretation Date/Time:  Saturday December 15 2022 15:48:02 EDT Ventricular Rate:  57 PR Interval:    QRS Duration:  91 QT Interval:  444 QTC Calculation: 433 R Axis:   -1  Text Interpretation: Atrial fibrillation Abnormal R-wave progression, early transition Baseline wander in lead(s) V4 Similar to most recent EKG Confirmed by Vivi Barrack [phone]) on 12/15/2022 4:11:34 PM  Radiology DG Chest Portable 1 View  Result Date: 12/15/2022 CLINICAL DATA:  COVID, weakness EXAM: PORTABLE CHEST 1 VIEW COMPARISON:  05/19/2022 FINDINGS: Cardiomegaly. Both lungs are clear. The visualized skeletal structures are unremarkable. IMPRESSION: Cardiomegaly without acute abnormality of the lungs in low volume AP portable projection. Electronically Signed   By: Jearld Lesch M.D.   On: 12/15/2022 17:15    Procedures .Critical Care  Performed by: Loetta Rough, MD Authorized by: Loetta Rough, MD   Critical care provider statement:    Critical care time (minutes):  80   Critical care was necessary to treat or prevent imminent or life-threatening deterioration of the following conditions:  Sepsis and shock   Critical care was time spent personally by me on the following activities:  Development of treatment plan with patient or surrogate, discussions with consultants, evaluation of patient's response to treatment, examination of patient, ordering and review of laboratory studies, ordering and review of radiographic studies, ordering and performing treatments and interventions, pulse oximetry, re-evaluation of patient's condition, review of old charts and obtaining history from patient or surrogate   Care discussed with: accepting provider at another facility   .Central Line  Date/Time: 12/15/2022 7:30 PM  Performed by: Loetta Rough,  MD Authorized by: Loetta Rough, MD   Consent:    Consent obtained:  Verbal   Consent given by:  Patient (daughter at bedside)   Risks, benefits,  and alternatives were discussed: yes     Risks discussed:  Arterial puncture, bleeding, nerve damage, incorrect placement and infection   Alternatives discussed:  No treatment Universal protocol:    Procedure explained and questions answered to patient or proxy's satisfaction: yes     Relevant documents present and verified: yes     Test results available: yes     Required blood products, implants, devices, and special equipment available: yes     Immediately prior to procedure, a time out was called: yes     Patient identity confirmed:  Arm band Pre-procedure details:    Indication(s): central venous access     Hand hygiene: Hand hygiene performed prior to insertion     Sterile barrier technique: All elements of maximal sterile technique followed     Skin preparation:  Chlorhexidine   Skin preparation agent: Skin preparation agent completely dried prior to procedure   Sedation:    Sedation type:  None Anesthesia:    Anesthesia method:  Local infiltration   Local anesthetic:  Lidocaine 1% w/o epi Procedure details:    Location:  L femoral   Patient position:  Supine   Procedural supplies:  Triple lumen   Landmarks identified: no     Ultrasound guidance: yes     Ultrasound guidance timing: real time     Sterile ultrasound techniques: Sterile gel and sterile probe covers were used     Number of attempts:  1   Successful placement: yes   Post-procedure details:    Post-procedure:  Dressing applied and line sutured   Assessment:  Blood return through all ports and free fluid flow   Procedure completion:  Tolerated well, no immediate complications     Medications Ordered in ED Medications  norepinephrine (LEVOPHED) 4mg  in (0.016 mg/mL) premix infusion (34 mcg/min Intravenous Rate/Dose Change 12/15/22 1931)  ceFEPIme  (MAXIPIME) 2 g in sodium chloride 0.9 % 100 mL IVPB (has no administration in time range)  lactated ringers bolus 1,000 mL (0 mLs Intravenous Stopped 12/15/22 1714)  lactated ringers bolus 1,000 mL (0 mLs Intravenous Stopped 12/15/22 1723)  PHENYLephrine 80 mcg/ml in normal saline Adult IV Push Syringe (For Blood Pressure Support) (80 mcg Intravenous Given 12/15/22 1708)    ED Course/ Medical Decision Making/ A&P                          Medical Decision Making Amount and/or Complexity of Data Reviewed Labs: ordered. Decision-making details documented in ED Course. Radiology: ordered.  Risk Prescription drug management. Decision regarding hospitalization.    This patient presents to the ED for concern of gen weakness, covid-19, hypotension; this involves an extensive number of treatment options, and is a complaint that carries with it a high risk of complications and morbidity.  I considered the following differential and admission for this acute, potentially life threatening condition.   MDM:    Patient presents hypotensive. Reportedly covid positive at home yesterday.  In s/o covid this is most likely hypovolemic and immediate resuscitation of fluids is begun. Also consdier septic shock though he denies N/V/D like he had last time. Consider PNA, UTI. Found to have AKI which is suspicious for hypovolemia causing hypotension as well though the AKI could be secondary to hypotension. Consider severe metabolic derangements or electrolyte abnormalities, ischemia/ACS though no CP reported, anemia, or heart failure (though does not appear volume overloaded on exam). He has no FNDs to indicate intracranial/central processes. He  has no abd TTP and daughter confirms no diarrhea or abd pain at home to indicate intraabdominal infection.    Clinical Course as of 12/15/22 1935  Sat Dec 15, 2022  1609 Hemoglobin(!): 11.1 C/w priors [HN]  1609 Platelets(!): 141 Mild thrombocytopenia, patient has had  this before [HN]  1801 SARS Coronavirus 2 by RT PCR(!): POSITIVE [HN]  1827 D/w ICU Dr. Delton Coombes who states a bed will be available in the next several hours. Requests central line placement. Will consent the daughter and place a line. [HN]  1933 Lactic Acid, Venous(!!): 2.9 [HN]  1933 Troponin I (High Sensitivity): 3 neg [HN]  1933 DG Chest Portable 1 View Cardiomegaly without acute abnormality of the lungs in low volume AP portable projection.  No PNA on CXR [HN]    Clinical Course User Index [HN] Loetta Rough, MD   He is afebrile but persistently hypotensive despite fluid resuscitation so will initiate pressors. While in the ED patient experienced a vagal episode similar to his presentation in January, and daugther confirmed. He had an episode of vomiting as well but came to and was protecting his airway. Daughter at bedside states he is at his baseline mental status. Phenylephrine injections x2 (160 mcg total) were used to raise his BP while awaiting norepi drip. Given the extent of his HoTN not responsive to fluids will get blood cultures and give antibiotics as well to cover for septic shock, as tachypnea, hypotension, elevated lactate are also concerning for sepsis.   Patient's MAP ~70 on 35 mcg/min of norepinephrine. Central line is inserted in L femoral for pressors. Patient will be transferred to Dr. Delton Coombes at [facility] ICU.    Labs: I Ordered, and personally interpreted labs.  The pertinent results include:  those listed above  Imaging Studies ordered: I ordered imaging studies including CXR I independently visualized and interpreted imaging. I agree with the radiologist interpretation  Additional history obtained from chart review, EMS.    Cardiac Monitoring: The patient was maintained on a cardiac monitor.  I personally viewed and interpreted the cardiac monitored which showed an underlying rhythm of: sinus bradcardia  Reevaluation: After the interventions noted above, I  reevaluated the patient and found that they have :improved  Social Determinants of Health: Lives with children  Disposition:  Icu at [facility]. Repeat trop and urine pending.  Co morbidities that complicate the patient evaluation  Past Medical History:  Diagnosis Date   Anemia    in past   Anxiety    Dementia (HCC)    able to sign own papers   Depression    Diabetes mellitus without complication (HCC)    Full dentures    upper and lower   GERD (gastroesophageal reflux disease)    Head trauma    admitted to [facility], "pint of blood removed from head", no deficits after   Hypercholesteremia    Hypertension    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)    none since release from hosp     Medicines Meds ordered this encounter  Medications   lactated ringers bolus 1,000 mL   lactated ringers bolus 1,000 mL   phenylephrine 80 mcg/10 mL injection    Meridee Score F: cabinet override   norepinephrine (LEVOPHED) 4-5 MG/250ML-% infusion SOLN    Wandra Mannan R: cabinet override   PHENYLephrine 80 mcg/ml in normal saline Adult IV Push Syringe (For Blood Pressure Support)    Meridee Score F: cabinet override   norepinephrine (LEVOPHED) 4mg  in (0.016  mg/mL) premix infusion   ceFEPIme (MAXIPIME) 2 g in sodium chloride 0.9 % 100 mL IVPB    I have reviewed the patients home medicines and have made adjustments as needed  Problem List / ED Course: Problem List Items Addressed This Visit       Cardiovascular and Mediastinum   * (Principal) Hypotension   Relevant Medications   norepinephrine (LEVOPHED) 4mg  in (0.016 mg/mL) premix infusion     Genitourinary   AKI (acute kidney injury) (HCC)     Other   Generalized weakness   Other Visit Diagnoses     COVID-19    -  Primary   Relevant Medications   nirmatrelvir & ritonavir (PAXLOVID, 300/100,) 20 x 150 MG & 10 x 100MG  TBPK   ceFEPIme (MAXIPIME) 2 g in sodium chloride 0.9 % 100 mL IVPB (Start on 12/15/2022  7:45 PM)                    This note was created using dictation software, which may contain spelling or grammatical errors.    Loetta Rough, MD 12/15/22 [phone]

## 2022-12-15 NOTE — ED Notes (Signed)
Pt had large loose green stool. Brief and bed linens changed. Daughter remains at bedside. Call bell in reach.

## 2022-12-15 NOTE — ED Notes (Signed)
Tech in room to help pt onto bedpan pt started vomiting and tech yelled out for help. Pt had vomited with mask on. Pt was not responding for a few minutes then came to and was responding and answering questions appropriately. EDP, respiratory , charge nurse and 2 other nurses responded to assist with pt care. Pt cleaned up and started on vasopressor for blood pressure. EDP remained at bedside to speak to pt family. Pt denies any pain. Pt started to humm music and mumble words to which pt daughter states that is baseline for pt and he does that all day long.

## 2022-12-15 NOTE — ED Triage Notes (Signed)
BIB EMS from home confirmed COVID + from home. Pt denies any pain. Feels tired and weak. Hypotensive. During triage. Pt alert and oriented x 4

## 2022-12-15 NOTE — ED Notes (Signed)
1ml phenylephrine given at [facility]

## 2022-12-15 NOTE — Progress Notes (Signed)
eLink Physician-Brief Progress Note Patient Name: LAZAR FUNARO DOB: 09/08/1927 MRN: [id number]   Date of Service  12/15/2022  HPI/Events of Note  89 year old man presented to [facility] 8/17 in transfer from [facility] for hypotension, suspected sepsis in the setting of COVID-19 infection. PMHx significant for HTN, HLD, T2DM, SDH (2015, s/p fall requiring craniotomy), GERD, anxiety/depression, mild dementia.  Labs notable for creat 1.97 and lactic acid 2.9.  Urinalysis and cxr unremarkable.  He is requiring vasopressor support.  Presently on [place] oxygen.    eICU Interventions  Chart reviewed     Intervention Category Evaluation Type: New Patient Evaluation  Henry Russel, P 12/15/2022, 10:36 PM

## 2022-12-15 NOTE — ED Notes (Signed)
Provider at bedside norepi gtt started 

## 2022-12-16 ENCOUNTER — Inpatient Hospital Stay (HOSPITAL_COMMUNITY): Payer: Medicare PPO

## 2022-12-16 DIAGNOSIS — J9690 Respiratory failure, unspecified, unspecified whether with hypoxia or hypercapnia: Secondary | ICD-10-CM | POA: Diagnosis not present

## 2022-12-16 DIAGNOSIS — R0603 Acute respiratory distress: Secondary | ICD-10-CM

## 2022-12-16 DIAGNOSIS — Z87891 Personal history of nicotine dependence: Secondary | ICD-10-CM | POA: Diagnosis not present

## 2022-12-16 DIAGNOSIS — U071 COVID-19: Secondary | ICD-10-CM | POA: Diagnosis not present

## 2022-12-16 LAB — CBC
HCT: 34.1 % — ABNORMAL LOW (ref 39.0–52.0)
Hemoglobin: 10.9 g/dL — ABNORMAL LOW (ref 13.0–17.0)
MCH: 31.8 pg (ref 26.0–34.0)
MCHC: 32 g/dL (ref 30.0–36.0)
MCV: 99.4 fL (ref 80.0–100.0)
Platelets: 141 10*3/uL — ABNORMAL LOW (ref 150–400)
RBC: 3.43 MIL/uL — ABNORMAL LOW (ref 4.22–5.81)
RDW: 12.7 % (ref 11.5–15.5)
WBC: 14.1 10*3/uL — ABNORMAL HIGH (ref 4.0–10.5)
nRBC: 0 % (ref 0.0–0.2)

## 2022-12-16 LAB — ECHOCARDIOGRAM COMPLETE
Area-P 1/2: 2.66 cm2
S' Lateral: 2.9 cm
Weight: 3488.56 oz

## 2022-12-16 LAB — BASIC METABOLIC PANEL
Anion gap: 12 (ref 5–15)
BUN: 28 mg/dL — ABNORMAL HIGH (ref 8–23)
CO2: 21 mmol/L — ABNORMAL LOW (ref 22–32)
Calcium: 8.4 mg/dL — ABNORMAL LOW (ref 8.9–10.3)
Chloride: 103 mmol/L (ref 98–111)
Creatinine, Ser: 1.86 mg/dL — ABNORMAL HIGH (ref 0.61–1.24)
GFR, Estimated: 33 mL/min — ABNORMAL LOW (ref >60)
Glucose, Bld: 147 mg/dL — ABNORMAL HIGH (ref 70–99)
Potassium: 3.7 mmol/L (ref 3.5–5.1)
Sodium: 136 mmol/L (ref 135–145)

## 2022-12-16 LAB — PHOSPHORUS: Phosphorus: 2.8 mg/dL (ref 2.5–4.6)

## 2022-12-16 LAB — GLUCOSE, CAPILLARY
Glucose-Capillary: 128 mg/dL — ABNORMAL HIGH (ref 70–99)
Glucose-Capillary: 172 mg/dL — ABNORMAL HIGH (ref 70–99)
Glucose-Capillary: 213 mg/dL — ABNORMAL HIGH (ref 70–99)
Glucose-Capillary: 208 mg/dL — ABNORMAL HIGH (ref 70–99)
Glucose-Capillary: 200 mg/dL — ABNORMAL HIGH (ref 70–99)

## 2022-12-16 LAB — MAGNESIUM: Magnesium: 1.5 mg/dL — ABNORMAL LOW (ref 1.7–2.4)

## 2022-12-16 LAB — C-REACTIVE PROTEIN: CRP: 7.5 mg/dL — ABNORMAL HIGH (ref <1.0)

## 2022-12-16 MED ORDER — ALPRAZOLAM 0.5 MG PO TABS: 0.2500 mg | ORAL_TABLET | Freq: Two times a day (BID) | ORAL | Status: DC

## 2022-12-16 MED ORDER — DORZOLAMIDE HCL-TIMOLOL MAL 2-0.5 % OP SOLN
1.0000 [drp] | Freq: Two times a day (BID) | OPHTHALMIC | Status: DC
  Administered 2022-12-16 – 2022-12-18 (×5): 1 [drp] via OPHTHALMIC
  Filled 2022-12-16: qty 10

## 2022-12-16 MED ORDER — MAGNESIUM SULFATE 4 GM/100ML IV SOLN
4.0000 g | Freq: Once | INTRAVENOUS | Status: AC
  Administered 2022-12-16: 4 g via INTRAVENOUS
  Filled 2022-12-16: qty 100

## 2022-12-16 MED ORDER — DEXTROSE IN LACTATED RINGERS 5 % IV SOLN: INTRAVENOUS | Status: DC

## 2022-12-16 MED ORDER — GABAPENTIN 100 MG PO CAPS
100.0000 mg | ORAL_CAPSULE | Freq: Two times a day (BID) | ORAL | Status: DC
  Administered 2022-12-16 – 2022-12-18 (×5): 100 mg via ORAL
  Filled 2022-12-16 (×5): qty 1

## 2022-12-16 MED ORDER — ORAL CARE MOUTH RINSE: 15.0000 mL | OROMUCOSAL | Status: DC | PRN

## 2022-12-16 MED ORDER — POTASSIUM CHLORIDE CRYS ER 20 MEQ PO TBCR
40.0000 meq | EXTENDED_RELEASE_TABLET | Freq: Once | ORAL | Status: AC
  Administered 2022-12-16: 40 meq via ORAL
  Filled 2022-12-16: qty 2

## 2022-12-16 NOTE — Progress Notes (Addendum)
NAME:  Tim Henry, MRN:  [id number], DOB:  [birth year], LOS: 1 ADMISSION DATE:  12/15/2022 CONSULTATION DATE:  12/15/2022 REFERRING MD:  Jearld Fenton - EDP ([facility]) CHIEF COMPLAINT:  Hypotension, COVID-19 infection  BRIEF  89 year old man who presented to [facility] 8/17 as a transfer from [facility] for hypotension, suspected sepsis in the setting of COVID-19 infection. PMHx significant for HTN, HLD, T2DM, SDH (2015, s/p fall requiring craniotomy), GERD, anxiety/depression, mild dementia. Recently seen in ED 7/1 for mechanical fall with which he hit his head; CT Head NAICA and he was discharged home.  Patient presented to [facility] 8/17 with generalized weakness/lethargy. Tested positive for COVID 8/16. Initially reported cough, but this was improved with cough medicine. Denies fever/chills, CP/SOB, n/v/d abdominal pain. Previously diagnosed with COVID 04/2022 with similar presentation, though had worse n/v/d at that time.  On ED arrival, patient was afebrile with HR 50s, BP 70s/40s, RR 18, SpO2 90s. Labs were notable for WB 10.9, Hgb 11.1, Plt 141. Na 136, K 4.0, CO2 22, Cr 1.97 (baseline 1.4-1.8), LFTs WNL. Trop WNL. LA 2.9.  Hyperglycemic to mid-200s. COVID +. CXR demonstrated cardiomegaly and low lung volumes, no acute process. Despite fluid resuscitation, patient remained hypotensive prompting vasopressor initiation. L femoral CVC was placed by EDP at [facility]. BCx were obtained and empiric broad-spectrum antibiotics started in the setting of presumed sepsis.  PCCM was consulted for ICU admission/transfer to [facility].  Pertinent Medical History:    has a past medical history of Anemia, Anxiety, Dementia (HCC), Depression, Diabetes mellitus without complication (HCC), Full dentures, GERD (gastroesophageal reflux disease), Hypercholesteremia, Hypertension, SDH (subdural hematoma) (HCC) (2015), and 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).   has a past surgical history that includes Joint replacement (Left); Back surgery (1963); Cataract  extraction w/PHACO (Left, 09/15/2014); Appendectomy; and Photocoagulation with laser (Right, 01/23/2016).    Significant Hospital Events: Including procedures, antibiotic start and stop dates in addition to other pertinent events   8/17 - Presented to [facility] ED for weakness, lethargy. COVID+. Hypotensive with SBP 70s despite fluid resuscitation prompting vasopressor initiation. L fem CVC placed. Broad-spectrum antibiotics initiated. Transferred to [facility] for further care.  Interim History / Subjective:    8/18: Currently oxygen 2 L nasal cannula -> off and pulse ox >= 95% room air.  Got magnesium sulfate.  Not on pressors not on the ventilator.  Had Tmax of nearly 103 Fahrenheit.  Currently afebrile.  White count 14.1 K.   Daughter : Like for patient to eat.  She is asking about adhesive for his dentures.  In addition she is asking about restarting his glaucoma eyedrops  Patient: Is oriented.  Daughter states that he is less confused.  He is asking when he can go home.  Objective:  Blood pressure 119/64, pulse 83, temperature 98.5 F (36.9 C), temperature source Oral, resp. rate (!) 24, weight 98.9 kg, SpO2 97%.        Intake/Output Summary (Last 24 hours) at 12/16/2022 1017 Last data filed at 12/16/2022 0800 Gross per 24 hour  Intake 867.06 ml  Output 400 ml  Net 467.06 ml   Filed Weights   12/15/22 1557 12/16/22 0432  Weight: 102 kg 98.9 kg   Physical Examination: General: Acutely ill-appearing elderly man in NAD. Pleasant and conversant. HEENT: [place]/AT, anicteric sclera, pupils equal/reactive, eyes cloudy, moist mucous membranes. Neuro: Awake, oriented x 3. Responds to verbal stimuli. Following commands consistently. Moves all 4 extremities spontaneously. Strength 4/5 in all 4 extremities.  CV: RRR, no m/g/r. PULM: Breathing  even and unlabored on [place]. Lung fields CTAB. GI: Soft, nontender, nondistended. Extremities: Bilateral symmetric 1+ LE/pedal edema noted. Skin: Warm/dry, BLE  lower leg skin changes c/w chronic venous stasis, mildly TTP.   General Appearance:  Looks better. Deconditined Head:  Normocephalic, without obvious abnormality, atraumatic Eyes:  PERRL - yes, conjunctiva/corneas - muddy     Ears:  Normal external ear canals, both ears Nose:  G tube - no Throat:  ETT TUBE - no , OG tube - no Neck:  Supple,  No enlargement/tenderness/nodules Lungs: Clear to auscultation bilaterally, Heart:  S1 and S2 normal, no murmur, CVP - no.  Pressors - no Abdomen:  Soft, no masses, no organomegaly Genitalia / Rectal:  Not done Extremities:  Extremities- intact Skin:  ntact in exposed areas . Sacral area - not examined Neurologic:  Sedation - none -> RASS - +1 . Moves all 4s - yes. CAM-ICU - likely negative though not formaly tested . Orientation - follows command     Resolved Hospital Problem List:    Assessment & Plan:  Presumed septic shock in the setting of COVID-19 infection  8/18: Off levophed. MAP 72  Plan - Goal MAP > 65 - D5LR at 75cc/h and then ressess 8/19 for DC - Fluid resuscitation as tolerated   Mild acute resp failure with  COVID-19 infection and  outpatient paxlovid. S/p 1 dose cefepime at admit 12/15/2022 (Tested positive for COVID as outpatient 8/16. Previously tested positive 04/2022 with similar presentation.)   8/19: off o2. CXR LLL effusion and ifnitlrated  plan - Pulse ox goal >= 94% with dark skin - Paxlovid as outpatient, hold inpatient - Remdesivir - Decadron 6mg  daily x 10days from 12/15/2022 - check CRP aand if rising consider Actemra - check Quant gold - track d-dimer and rising rule out VTE - track trop/BNP/ECHO   HTN HLD  818: off pressors  PLAN - Hold home antihypertensives in the setting of vasopressor needs - Continue ASA/statin - Hold home Lasix for now -check trop/bnp/echo  AKI V CKD v both. BAseline creat likely 1.3mg %. ADmit 1.97mg %   8/19: creat better  Plan  - d5LR at 31mL/hr and monitor -  check PSA  Chronic anemia - baseline hgb 10.7 - 11.6gm%  8/18: no bleeding  Plan  - PRBC for hgb </= 6.9gm%    - exceptions are   -  if ACS susepcted/confirmed then transfuse for hgb </= 8.0gm%,  or    -  active bleeding with hemodynamic instability, then transfuse regardless of hemoglobin value   At at all times try to transfuse 1 unit prbc as possible with exception of active hemorrhage -   T2DM - SSI - CBGs Q4H - Goal CBG 140-180 - Hold home Januvia, metformin  GERD - PPI  History of SDH s/p craniotomy 2015 Seizure s/p SDH, none since prior admission - Baseline mentation at present - Neuroprotective measures: HOB > 30 degrees, normoglycemia, normothermia, electrolytes WNL  Anxiety Depression   Plan  - Resume home Zoloft, Xanax PRN -= resume gaabapentin at lower dose  BPH - Continue finasteride   GLAUCOMA  Plan  - resart eye drops  Best Practice: (right click and "Reselect all SmartList Selections" daily)   Diet/type: NPO -> advance diet but family need to buy denture adhesive commercially 12/16/22.  DVT prophylaxis: SCDs, SQH GI prophylaxis: PPI Lines: Central line Foley:  N/A Code Status:  full code Last date of multidisciplinary goals of care discussion [Pending]  8/18: daughter at bedside  updated.   DISPO Move to med-surg. CCM off and [facility] pick up 12/17/22 and d/with Dr Benjamine Mola of [facility]    ATTESTATION & SIGNATURE     Dr. Kalman Shan, M.D., [facility].C.P Pulmonary and Critical Care Medicine Staff Physician, [facility] Pager: [phone], If no answer or between  15:00h - 7:00h: call [phone]  12/16/2022 10:17 AM    LABS    PULMONARY No results for input(s): "PHART", "PCO2ART", "PO2ART", "HCO3", "TCO2", "O2SAT" in the last 168 hours.  Invalid input(s): "PCO2", "PO2"  CBC Recent Labs  Lab 12/15/22 1544 12/16/22 0442  HGB 11.1* 10.9*  HCT 34.8* 34.1*  WBC 10.9* 14.1*  PLT 141*  141*    COAGULATION No results for input(s): "INR" in the last 168 hours.  CARDIAC  No results for input(s): "TROPONINI" in the last 168 hours. No results for input(s): "PROBNP" in the last 168 hours.   CHEMISTRY Recent Labs  Lab 12/15/22 1548 12/16/22 0442  NA 136 136  K 4.0 3.7  CL 103 103  CO2 22 21*  GLUCOSE 242* 147*  BUN 29* 28*  CREATININE 1.97* 1.86*  CALCIUM 8.5* 8.4*  MG  --  1.5*  PHOS  --  2.8   Estimated Creatinine Clearance: 29.7 mL/min (A) (by C-G formula based on SCr of 1.86 mg/dL (H)).   LIVER Recent Labs  Lab 12/15/22 1548  AST 20  ALT 12  ALKPHOS 54  BILITOT 0.8  PROT 6.6  ALBUMIN 3.4*     INFECTIOUS Recent Labs  Lab 12/15/22 1812 12/15/22 2148  LATICACIDVEN 2.9*  --   PROCALCITON  --  0.27     ENDOCRINE CBG (last 3)  Recent Labs    12/15/22 2305 12/16/22 0307 12/16/22 0719  GLUCAP 232* 128* 172*         IMAGING x48h  - image(s) personally visualized  -   highlighted in bold DG Chest Port 1 View  Result Date: 12/16/2022 CLINICAL DATA:  COVID infection. EXAM: PORTABLE CHEST 1 VIEW COMPARISON:  12/15/2022 FINDINGS: Interval development of diffuse hazy airspace opacity in the left mid lung with increased retrocardiac airspace disease. Right lung remains relatively clear. Cardiopericardial silhouette is at upper limits of normal for size. No acute bony abnormality. Telemetry leads overlie the chest. IMPRESSION: Interval development of diffuse hazy airspace opacity in the left mid lung with increased retrocardiac airspace disease. Imaging features compatible with pneumonia. Electronically Signed   By: Kennith Center M.D.   On: 12/16/2022 07:40   DG Chest Portable 1 View  Result Date: 12/15/2022 CLINICAL DATA:  COVID, weakness EXAM: PORTABLE CHEST 1 VIEW COMPARISON:  05/19/2022 FINDINGS: Cardiomegaly. Both lungs are clear. The visualized skeletal structures are unremarkable. IMPRESSION: Cardiomegaly without acute abnormality of  the lungs in low volume AP portable projection. Electronically Signed   By: Jearld Lesch M.D.   On: 12/15/2022 17:15

## 2022-12-16 NOTE — Progress Notes (Signed)
[facility] ADULT ICU REPLACEMENT PROTOCOL   The patient does apply for the [facility] Adult ICU Electrolyte Replacment Protocol based on the criteria listed below:   1.Exclusion criteria: TCTS, ECMO, Dialysis, and Myasthenia Gravis patients 2. Is GFR >/= 30 ml/min? Yes.    Patient's GFR today is 33 3. Is SCr </= 2? Yes.   Patient's SCr is 1.86 mg/dL 4. Did SCr increase >/= 0.5 in 24 hours? No. 5.Pt's weight >40kg  Yes.   6. Abnormal electrolyte(s): K, Mag  7. Electrolytes replaced per protocol 8.  Call MD STAT for K+ </= 2.5, Phos </= 1, or Mag </= 1 Physician:  Flossie Buffy E Sarabelle Genson 12/16/2022 5:47 AM

## 2022-12-16 NOTE — Progress Notes (Signed)
Patient arrived to the unit. Left femoral site gauze and Tegaderm, no drainage noted.

## 2022-12-16 NOTE — Progress Notes (Signed)
Report called and RN approved. Pt stable for transfer and vitals w/in order set parameters

## 2022-12-17 ENCOUNTER — Inpatient Hospital Stay (HOSPITAL_COMMUNITY): Payer: Medicare PPO

## 2022-12-17 DIAGNOSIS — M79661 Pain in right lower leg: Secondary | ICD-10-CM | POA: Diagnosis not present

## 2022-12-17 DIAGNOSIS — E1142 Type 2 diabetes mellitus with diabetic polyneuropathy: Secondary | ICD-10-CM

## 2022-12-17 DIAGNOSIS — A419 Sepsis, unspecified organism: Secondary | ICD-10-CM | POA: Diagnosis not present

## 2022-12-17 DIAGNOSIS — U071 COVID-19: Secondary | ICD-10-CM | POA: Diagnosis not present

## 2022-12-17 DIAGNOSIS — E118 Type 2 diabetes mellitus with unspecified complications: Secondary | ICD-10-CM

## 2022-12-17 DIAGNOSIS — N401 Enlarged prostate with lower urinary tract symptoms: Secondary | ICD-10-CM

## 2022-12-17 DIAGNOSIS — I10 Essential (primary) hypertension: Secondary | ICD-10-CM

## 2022-12-17 DIAGNOSIS — R6521 Severe sepsis with septic shock: Secondary | ICD-10-CM

## 2022-12-17 DIAGNOSIS — N1832 Chronic kidney disease, stage 3b: Secondary | ICD-10-CM | POA: Diagnosis present

## 2022-12-17 DIAGNOSIS — R350 Frequency of micturition: Secondary | ICD-10-CM

## 2022-12-17 LAB — MAGNESIUM: Magnesium: 2.4 mg/dL (ref 1.7–2.4)

## 2022-12-17 LAB — COMPREHENSIVE METABOLIC PANEL
ALT: 50 U/L — ABNORMAL HIGH (ref 0–44)
AST: 38 U/L (ref 15–41)
Albumin: 2.7 g/dL — ABNORMAL LOW (ref 3.5–5.0)
Alkaline Phosphatase: 63 U/L (ref 38–126)
Anion gap: 8 (ref 5–15)
BUN: 33 mg/dL — ABNORMAL HIGH (ref 8–23)
CO2: 23 mmol/L (ref 22–32)
Calcium: 8.3 mg/dL — ABNORMAL LOW (ref 8.9–10.3)
Chloride: 105 mmol/L (ref 98–111)
Creatinine, Ser: 1.47 mg/dL — ABNORMAL HIGH (ref 0.61–1.24)
GFR, Estimated: 44 mL/min — ABNORMAL LOW (ref >60)
Glucose, Bld: 186 mg/dL — ABNORMAL HIGH (ref 70–99)
Potassium: 4.1 mmol/L (ref 3.5–5.1)
Sodium: 136 mmol/L (ref 135–145)
Total Bilirubin: 0.8 mg/dL (ref 0.3–1.2)
Total Protein: 5.8 g/dL — ABNORMAL LOW (ref 6.5–8.1)

## 2022-12-17 LAB — GLUCOSE, CAPILLARY
Glucose-Capillary: 166 mg/dL — ABNORMAL HIGH (ref 70–99)
Glucose-Capillary: 180 mg/dL — ABNORMAL HIGH (ref 70–99)
Glucose-Capillary: 184 mg/dL — ABNORMAL HIGH (ref 70–99)
Glucose-Capillary: 214 mg/dL — ABNORMAL HIGH (ref 70–99)
Glucose-Capillary: 243 mg/dL — ABNORMAL HIGH (ref 70–99)
Glucose-Capillary: 259 mg/dL — ABNORMAL HIGH (ref 70–99)

## 2022-12-17 LAB — CBC
HCT: 33.7 % — ABNORMAL LOW (ref 39.0–52.0)
Hemoglobin: 10.8 g/dL — ABNORMAL LOW (ref 13.0–17.0)
MCH: 31.6 pg (ref 26.0–34.0)
MCHC: 32 g/dL (ref 30.0–36.0)
MCV: 98.5 fL (ref 80.0–100.0)
Platelets: 157 10*3/uL (ref 150–400)
RBC: 3.42 MIL/uL — ABNORMAL LOW (ref 4.22–5.81)
RDW: 12.9 % (ref 11.5–15.5)
WBC: 13.3 10*3/uL — ABNORMAL HIGH (ref 4.0–10.5)
nRBC: 0 % (ref 0.0–0.2)

## 2022-12-17 LAB — LACTIC ACID, PLASMA: Lactic Acid, Venous: 1 mmol/L (ref 0.5–1.9)

## 2022-12-17 LAB — PSA: Prostatic Specific Antigen: 1.92 ng/mL (ref 0.00–4.00)

## 2022-12-17 LAB — C-REACTIVE PROTEIN: CRP: 9.1 mg/dL — ABNORMAL HIGH (ref <1.0)

## 2022-12-17 LAB — TROPONIN I (HIGH SENSITIVITY): Troponin I (High Sensitivity): 10 ng/L (ref <18)

## 2022-12-17 LAB — PHOSPHORUS: Phosphorus: 3.3 mg/dL (ref 2.5–4.6)

## 2022-12-17 LAB — BRAIN NATRIURETIC PEPTIDE: B Natriuretic Peptide: 248 pg/mL — ABNORMAL HIGH (ref 0.0–100.0)

## 2022-12-17 LAB — D-DIMER, QUANTITATIVE: D-Dimer, Quant: 4.09 ug{FEU}/mL — ABNORMAL HIGH (ref 0.00–0.50)

## 2022-12-17 MED ORDER — INSULIN ASPART 100 UNIT/ML IJ SOLN
3.0000 [IU] | Freq: Three times a day (TID) | INTRAMUSCULAR | Status: DC
  Administered 2022-12-17 – 2022-12-18 (×3): 3 [IU] via SUBCUTANEOUS

## 2022-12-17 MED ORDER — INSULIN ASPART 100 UNIT/ML IJ SOLN
0.0000 [IU] | Freq: Three times a day (TID) | INTRAMUSCULAR | Status: DC
  Administered 2022-12-17: 5 [IU] via SUBCUTANEOUS
  Administered 2022-12-18: 8 [IU] via SUBCUTANEOUS
  Administered 2022-12-18: 5 [IU] via SUBCUTANEOUS

## 2022-12-17 MED ORDER — INSULIN ASPART 100 UNIT/ML IJ SOLN
0.0000 [IU] | Freq: Every day | INTRAMUSCULAR | Status: DC
  Administered 2022-12-17: 2 [IU] via SUBCUTANEOUS

## 2022-12-17 MED ORDER — FERROUS SULFATE 325 (65 FE) MG PO TABS
325.0000 mg | ORAL_TABLET | Freq: Every day | ORAL | Status: DC
  Administered 2022-12-17: 325 mg via ORAL
  Filled 2022-12-17: qty 1

## 2022-12-17 NOTE — Progress Notes (Signed)
Bilateral lower extremity venous study completed.   Preliminary results relayed to MD and RN.  Please see CV Procedures for preliminary results.  Jyssica Rief, RVT  3:46 PM 12/17/22

## 2022-12-17 NOTE — Assessment & Plan Note (Signed)
Blood pressure currently within goal.  On multiple antihypertensives at home which are being held due to softer blood pressure on admission requiring pressors. -Continue to monitor-we will add antihypertensives as needed

## 2022-12-17 NOTE — Assessment & Plan Note (Signed)
Patient met sepsis criteria, secondary to COVID infection.  Hypotensive initially requiring pressors, now weaned of from pressor. -Continue to monitor -Completed 3 days of remdesivir -Continue with Decadron

## 2022-12-17 NOTE — Evaluation (Signed)
Physical Therapy Evaluation Patient Details Name: Tim Henry MRN: [id number] DOB: 06/28/1927 Today's Date: 12/17/2022  History of Present Illness  Pt is 89 year old presented to [facility] from [facility] on  8/17 for covid and hypotension. Pt with septic shock and initially required pressors. PMH - HTN, DM, SHD, craniotomy, peripheral neuropathy  Clinical Impression  Pt admitted with above diagnosis and presents to PT with functional limitations due to deficits listed below (See PT problem list). Pt needs skilled PT to maximize independence and safety. Expect pt's mobility will improve with the incr activity. Plans to return home with supportive family.           If plan is discharge home, recommend the following: A little help with walking and/or transfers;Assist for transportation;Help with stairs or ramp for entrance;Assistance with cooking/housework   Can travel by private vehicle        Equipment Recommendations None recommended by PT  Recommendations for Other Services       Functional Status Assessment Patient has had a recent decline in their functional status and demonstrates the ability to make significant improvements in function in a reasonable and predictable amount of time.     Precautions / Restrictions Precautions Precautions: Fall;Other (comment) Precaution Comments: covid      Mobility  Bed Mobility Overal bed mobility: Needs Assistance Bed Mobility: Supine to Sit     Supine to sit: Contact guard     General bed mobility comments: Assist for safety and lines and incr time needed    Transfers Overall transfer level: Needs assistance Equipment used: Rolling walker (2 wheels) Transfers: Sit to/from Stand Sit to Stand: Min assist, From elevated surface           General transfer comment: Assist to power up. Pt pulling up on walker    Ambulation/Gait Ambulation/Gait assistance: Contact guard assist Gait Distance (Feet): 12 Feet Assistive device:  Rolling walker (2 wheels) Gait Pattern/deviations: Step-through pattern, Decreased step length - right, Decreased step length - left, Trunk flexed Gait velocity: decr Gait velocity interpretation: <1.31 ft/sec, indicative of household ambulator   General Gait Details: Assist for safety. Verbal cues to stay closer to walker and stand more upright  Stairs            Wheelchair Mobility     Tilt Bed    Modified Rankin (Stroke Patients Only)       Balance Overall balance assessment: Needs assistance Sitting-balance support: No upper extremity supported, Feet supported Sitting balance-Leahy Scale: Good     Standing balance support: Bilateral upper extremity supported, During functional activity Standing balance-Leahy Scale: Poor Standing balance comment: walker and supervision for static standing                             Pertinent Vitals/Pain Pain Assessment Pain Assessment: No/denies pain    Home Living Family/patient expects to be discharged to:: Private residence Living Arrangements: Children Available Help at Discharge: Family;Available 24 hours/day Type of Home: House Home Access: Stairs to enter Entrance Stairs-Rails: Right Entrance Stairs-Number of Steps: 3 steps   Home Layout: One level Home Equipment: Agricultural consultant (2 wheels);Rollator (4 wheels);Shower seat;Grab bars - toilet;Grab bars - tub/shower (upright rollator)      Prior Function Prior Level of Function : Independent/Modified Independent             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 Does not drive       Extremity/Trunk  Assessment   Upper Extremity Assessment Upper Extremity Assessment: Defer to OT evaluation    Lower Extremity Assessment Lower Extremity Assessment: Generalized weakness       Communication   Communication Communication: No apparent difficulties  Cognition Arousal: Alert Behavior During Therapy: WFL for tasks  assessed/performed Overall Cognitive Status: Within Functional Limits for tasks assessed                                          General Comments      Exercises General Exercises - Lower Extremity Long Arc Quad: Strengthening, Both, 10 reps, Seated Toe Raises: Strengthening, Both, 10 reps, Seated Heel Raises: Strengthening, Both, 10 reps, Seated   Assessment/Plan    PT Assessment Patient needs continued PT services  PT Problem List Decreased strength;Decreased balance;Decreased mobility;Decreased activity tolerance       PT Treatment Interventions DME instruction;Gait training;Stair training;Functional mobility training;Therapeutic activities;Therapeutic exercise;Balance training;Patient/family education    PT Goals (Current goals can be found in the Care Plan section)  Acute Rehab PT Goals Patient Stated Goal: go home PT Goal Formulation: With patient Time For Goal Achievement: 12/31/22 Potential to Achieve Goals: Good    Frequency Min 1X/week     Co-evaluation               AM-PAC PT "6 Clicks" Mobility  Outcome Measure Help needed turning from your back to your side while in a flat bed without using bedrails?: None Help needed moving from lying on your back to sitting on the side of a flat bed without using bedrails?: A Little Help needed moving to and from a bed to a chair (including a wheelchair)?: A Little Help needed standing up from a chair using your arms (e.g., wheelchair or bedside chair)?: A Little Help needed to walk in hospital room?: A Little Help needed climbing 3-5 steps with a railing? : Total 6 Click Score: 17    End of Session Equipment Utilized During Treatment: Gait belt Activity Tolerance: Patient tolerated treatment well Patient left: in chair;with call bell/phone within reach;with chair alarm set Nurse Communication: Mobility status (nurse tech) PT Visit Diagnosis: Other abnormalities of gait and mobility  (R26.89);Muscle weakness (generalized) (M62.81)    Time: 4098-1191 PT Time Calculation (min) (ACUTE ONLY): 38 min   Charges:   PT Evaluation $PT Eval Moderate Complexity: 1 Mod PT Treatments $Gait Training: 23-37 mins PT General Charges $$ ACUTE PT VISIT: 1 Visit         [facility] PT Acute Rehabilitation Services Office [phone]   Angelina Ok [facility] 12/17/2022, 6:24 PM

## 2022-12-17 NOTE — Progress Notes (Addendum)
Progress Note   Patient: Tim Henry ACZ:[id number] DOB: 1928/04/17 DOA: 12/15/2022     2 DOS: the patient was seen and examined on 12/17/2022   Brief hospital course: PCCM transfer for 12/17/2022.  89 year old man who presented to [facility] 8/17 as a transfer from [facility] for hypotension, suspected sepsis in the setting of COVID-19 infection. PMHx significant for HTN, HLD, T2DM, SDH (2015, s/p fall requiring craniotomy), GERD, anxiety/depression, mild dementia. Recently seen in ED 7/1 for mechanical fall with which he hit his head; CT Head NAICA and he was discharged home.   Patient presented to [facility] 8/17 with generalized weakness/lethargy. Tested positive for COVID 8/16.  He was found to be hypotensive with inadequate response to IV fluid, patient was started on vasopressor, left femoral CVC was placed by EDP at [facility].  Started on broad-spectrum antibiotics and blood cultures were obtained.  Patient was transferred to [facility] for further care.  Later transitioned off from vasopressors and care is being transferred to [facility].  Patient was started on Decadron and remdesivir in ICU.  8/19: Vital stable, remained afebrile.  Completed 3 doses of remdesivir today.  D-dimer elevated, ordered lower extremity venous Doppler, remained on room air increasing CRP at 9.1. Still feeling weak and would like to spend another night. PT/OT evaluation ordered    Assessment and Plan: * Septic shock [facility]) Patient met sepsis criteria, secondary to COVID infection.  Hypotensive initially requiring pressors, now weaned of from pressor. -Continue to monitor -Completed 3 days of remdesivir -Continue with Decadron  COVID-19 virus infection Patient was given Paxlovid as outpatient which was not continued. Received 3 doses of remdesivir in ICU Worsening CRP and elevated D-dimer, no respiratory compromise and remained on room air. -Checking lower extremity venous Doppler -Monitor CRP -Continue with Decadron -Continue with  supportive care  Type 2 diabetes mellitus with complication (HCC) CBG elevated, patient is on steroid. -Increase the strength of SSI -Adding 3 units with meal  Diabetic peripheral neuropathy (HCC) -Continue home Neurontin  Essential hypertension Blood pressure currently within goal.  On multiple antihypertensives at home which are being held due to softer blood pressure on admission requiring pressors. -Continue to monitor-we will add antihypertensives as needed  BPH (benign prostatic hyperplasia) -Continue home finasteride  CKD stage G3b/A1, GFR 30-44 and albumin creatinine ratio <30 mg/g (HCC) Patient did had mild AKI, which has been improved and currently creatinine at baseline. -Monitor renal function -Avoid nephrotoxins   Subjective: Patient was seen and examined today.  No new concern.  Still feeling weak and would like to spend another night.  Physical Exam: Vitals:   12/17/22 0447 12/17/22 0745 12/17/22 0800 12/17/22 1200  BP: 126/70 128/73  126/72  Pulse: 67 73  62  Resp: 18 19  18   Temp: 98.9 F (37.2 C) 98.6 F (37 C)  (!) 97.5 F (36.4 C)  TempSrc: Oral Oral  Oral  SpO2: 98% 99%  98%  Weight:   100.1 kg    General.  Frail elderly man, in no acute distress. Pulmonary.  Lungs clear bilaterally, normal respiratory effort. CV.  Regular rate and rhythm, no JVD, rub or murmur. Abdomen.  Soft, nontender, nondistended, BS positive. CNS.  Alert and oriented .  No focal neurologic deficit. Extremities.  No edema, pulses intact and symmetrical. Psychiatry.  Judgment and insight appears normal.   Data Reviewed: Prior data reviewed  Family Communication: Unable to reach family member on phone.  No family at bedside  Disposition: Status is: Inpatient Remains  inpatient appropriate because: Severity of illness.  Planned Discharge Destination: Home with Home Health DVT prophylaxis.  Subcu heparin Time spent: 40 minutes  This record has been created using [employer]. Errors have been sought and corrected,but may not always be located. Such creation errors do not reflect on the standard of care.   Author: Arnetha Courser, MD 12/17/2022 2:57 PM  For on call review [email or web address].

## 2022-12-17 NOTE — Assessment & Plan Note (Signed)
Patient did had mild AKI, which has been improved and currently creatinine at baseline. -Monitor renal function -Avoid nephrotoxins

## 2022-12-17 NOTE — Assessment & Plan Note (Signed)
Patient was given Paxlovid as outpatient which was not continued. Received 3 doses of remdesivir in ICU Worsening CRP and elevated D-dimer, no respiratory compromise and remained on room air. -Checking lower extremity venous Doppler -Monitor CRP -Continue with Decadron -Continue with supportive care

## 2022-12-17 NOTE — Plan of Care (Signed)
  Problem: Education: Goal: Knowledge of General Education information will improve Description: Including pain rating scale, medication(s)/side effects and non-pharmacologic comfort measures Outcome: Progressing   Problem: Health Behavior/Discharge Planning: Goal: Ability to manage health-related needs will improve Outcome: Progressing   Problem: Clinical Measurements: Goal: Ability to maintain clinical measurements within normal limits will improve Outcome: Progressing Goal: Will remain free from infection Outcome: Progressing Goal: Diagnostic test results will improve Outcome: Progressing Goal: Respiratory complications will improve Outcome: Progressing Goal: Cardiovascular complication will be avoided Outcome: Progressing   Problem: Activity: Goal: Risk for activity intolerance will decrease Outcome: Progressing   Problem: Nutrition: Goal: Adequate nutrition will be maintained Outcome: Progressing   Problem: Coping: Goal: Level of anxiety will decrease Outcome: Progressing   Problem: Elimination: Goal: Will not experience complications related to bowel motility Outcome: Progressing Goal: Will not experience complications related to urinary retention Outcome: Progressing   Problem: Pain Managment: Goal: General experience of comfort will improve Outcome: Progressing   Problem: Safety: Goal: Ability to remain free from injury will improve Outcome: Progressing   Problem: Skin Integrity: Goal: Risk for impaired skin integrity will decrease Outcome: Progressing   Problem: Education: Goal: Knowledge of risk factors and measures for prevention of condition will improve Outcome: Progressing   Problem: Coping: Goal: Psychosocial and spiritual needs will be supported Outcome: Progressing   Problem: Respiratory: Goal: Will maintain a patent airway Outcome: Progressing Goal: Complications related to the disease process, condition or treatment will be avoided or  minimized Outcome: Progressing   Problem: Education: Goal: Ability to describe self-care measures that may prevent or decrease complications (Diabetes Survival Skills Education) will improve Outcome: Progressing Goal: Individualized Educational Video(s) Outcome: Progressing   Problem: Coping: Goal: Ability to adjust to condition or change in health will improve Outcome: Progressing   Problem: Fluid Volume: Goal: Ability to maintain a balanced intake and output will improve Outcome: Progressing   Problem: Health Behavior/Discharge Planning: Goal: Ability to identify and utilize available resources and services will improve Outcome: Progressing Goal: Ability to manage health-related needs will improve Outcome: Progressing   Problem: Metabolic: Goal: Ability to maintain appropriate glucose levels will improve Outcome: Progressing   Problem: Nutritional: Goal: Maintenance of adequate nutrition will improve Outcome: Progressing Goal: Progress toward achieving an optimal weight will improve Outcome: Progressing   Problem: Skin Integrity: Goal: Risk for impaired skin integrity will decrease Outcome: Progressing   Problem: Tissue Perfusion: Goal: Adequacy of tissue perfusion will improve Outcome: Progressing   

## 2022-12-17 NOTE — Assessment & Plan Note (Signed)
Continue home Neurontin

## 2022-12-17 NOTE — Assessment & Plan Note (Signed)
-  Continue home finasteride 

## 2022-12-17 NOTE — Hospital Course (Addendum)
PCCM transfer for 12/17/2022.  89 year old man who presented to [facility] 8/17 as a transfer from [facility] for hypotension, suspected sepsis in the setting of COVID-19 infection. PMHx significant for HTN, HLD, T2DM, SDH (2015, s/p fall requiring craniotomy), GERD, anxiety/depression, mild dementia. Recently seen in ED 7/1 for mechanical fall with which he hit his head; CT Head NAICA and he was discharged home.   Patient presented to [facility] 8/17 with generalized weakness/lethargy. Tested positive for COVID 8/16.  He was found to be hypotensive with inadequate response to IV fluid, patient was started on vasopressor, left femoral CVC was placed by EDP at [facility].  Started on broad-spectrum antibiotics and blood cultures were obtained.  Patient was transferred to [facility] for further care.  Later transitioned off from vasopressors and care is being transferred to [facility].  Patient was started on Decadron and remdesivir in ICU.  8/19: Vital stable, remained afebrile.  Completed 3 doses of remdesivir today.  D-dimer elevated, ordered lower extremity venous Doppler, remained on room air increasing CRP at 9.1. Still feeling weak and would like to spend another night. PT/OT evaluation ordered  8/20: Overnight some concern of mild hemoptysis, VQ scan was ordered by night on-call person and it was negative for concern of PE.  Persistent leukocytosis, procalcitonin at 0.31, improving CRP.  Starting on cefuroxime and Doxy for 5 days with concern of superadded bacterial infection.  Lower extremity venous Doppler was also negative for DVT.  Clinically stable.  Home health ordered.  Patient was given 4 more days of Decadron and 78 days old antibiotics. Blood pressure now elevated, patient can resume his home medications on discharge.  Patient will continue his home medications and complete a 5-day course of antibiotic. Patient will follow-up with primary care provider for further recommendations.

## 2022-12-17 NOTE — Assessment & Plan Note (Signed)
CBG elevated, patient is on steroid. -Increase the strength of SSI -Adding 3 units with meal

## 2022-12-18 ENCOUNTER — Inpatient Hospital Stay (HOSPITAL_COMMUNITY): Payer: Medicare PPO

## 2022-12-18 DIAGNOSIS — N179 Acute kidney failure, unspecified: Secondary | ICD-10-CM | POA: Diagnosis not present

## 2022-12-18 DIAGNOSIS — A419 Sepsis, unspecified organism: Secondary | ICD-10-CM | POA: Diagnosis not present

## 2022-12-18 DIAGNOSIS — R531 Weakness: Secondary | ICD-10-CM

## 2022-12-18 DIAGNOSIS — U071 COVID-19: Secondary | ICD-10-CM | POA: Diagnosis not present

## 2022-12-18 LAB — COMPREHENSIVE METABOLIC PANEL WITH GFR
ALT: 41 U/L (ref 0–44)
AST: 28 U/L (ref 15–41)
Albumin: 2.7 g/dL — ABNORMAL LOW (ref 3.5–5.0)
Alkaline Phosphatase: 57 U/L (ref 38–126)
Anion gap: 9 (ref 5–15)
BUN: 34 mg/dL — ABNORMAL HIGH (ref 8–23)
CO2: 23 mmol/L (ref 22–32)
Calcium: 8.3 mg/dL — ABNORMAL LOW (ref 8.9–10.3)
Chloride: 103 mmol/L (ref 98–111)
Creatinine, Ser: 1.37 mg/dL — ABNORMAL HIGH (ref 0.61–1.24)
GFR, Estimated: 48 mL/min — ABNORMAL LOW
Glucose, Bld: 241 mg/dL — ABNORMAL HIGH (ref 70–99)
Potassium: 4 mmol/L (ref 3.5–5.1)
Sodium: 135 mmol/L (ref 135–145)
Total Bilirubin: 0.7 mg/dL (ref 0.3–1.2)
Total Protein: 5.6 g/dL — ABNORMAL LOW (ref 6.5–8.1)

## 2022-12-18 LAB — CBC
HCT: 33.2 % — ABNORMAL LOW (ref 39.0–52.0)
Hemoglobin: 10.8 g/dL — ABNORMAL LOW (ref 13.0–17.0)
MCH: 31.8 pg (ref 26.0–34.0)
MCHC: 32.5 g/dL (ref 30.0–36.0)
MCV: 97.6 fL (ref 80.0–100.0)
Platelets: 177 K/uL (ref 150–400)
RBC: 3.4 MIL/uL — ABNORMAL LOW (ref 4.22–5.81)
RDW: 12.8 % (ref 11.5–15.5)
WBC: 13.2 K/uL — ABNORMAL HIGH (ref 4.0–10.5)
nRBC: 0 % (ref 0.0–0.2)

## 2022-12-18 LAB — GLUCOSE, CAPILLARY
Glucose-Capillary: 188 mg/dL — ABNORMAL HIGH (ref 70–99)
Glucose-Capillary: 201 mg/dL — ABNORMAL HIGH (ref 70–99)
Glucose-Capillary: 219 mg/dL — ABNORMAL HIGH (ref 70–99)
Glucose-Capillary: 224 mg/dL — ABNORMAL HIGH (ref 70–99)
Glucose-Capillary: 256 mg/dL — ABNORMAL HIGH (ref 70–99)

## 2022-12-18 LAB — C-REACTIVE PROTEIN: CRP: 5.3 mg/dL — ABNORMAL HIGH

## 2022-12-18 LAB — PROCALCITONIN: Procalcitonin: 0.31 ng/mL

## 2022-12-18 MED ORDER — DEXAMETHASONE 6 MG PO TABS: 6.0000 mg | ORAL_TABLET | ORAL | 0 refills | Status: AC

## 2022-12-18 MED ORDER — DOXYCYCLINE HYCLATE 100 MG PO TABS
100.0000 mg | ORAL_TABLET | Freq: Two times a day (BID) | ORAL | Status: DC
  Administered 2022-12-18: 100 mg via ORAL
  Filled 2022-12-18: qty 1

## 2022-12-18 MED ORDER — TECHNETIUM TO 99M ALBUMIN AGGREGATED
4.4000 | Freq: Once | INTRAVENOUS | Status: AC | PRN
  Administered 2022-12-18: 4.4 via INTRAVENOUS

## 2022-12-18 MED ORDER — DOXYCYCLINE HYCLATE 100 MG PO TABS: 100.0000 mg | ORAL_TABLET | Freq: Two times a day (BID) | ORAL | 0 refills | Status: AC

## 2022-12-18 MED ORDER — GUAIFENESIN ER 600 MG PO TB12: 600.0000 mg | ORAL_TABLET | Freq: Two times a day (BID) | ORAL | 0 refills | Status: AC | PRN

## 2022-12-18 MED ORDER — BENZONATATE 100 MG PO CAPS: 100.0000 mg | ORAL_CAPSULE | Freq: Three times a day (TID) | ORAL | Status: DC | PRN

## 2022-12-18 MED ORDER — CEFUROXIME AXETIL 500 MG PO TABS
500.0000 mg | ORAL_TABLET | Freq: Two times a day (BID) | ORAL | Status: DC
  Filled 2022-12-18 (×3): qty 1

## 2022-12-18 MED ORDER — GUAIFENESIN ER 600 MG PO TB12: 600.0000 mg | ORAL_TABLET | Freq: Two times a day (BID) | ORAL | Status: DC | PRN

## 2022-12-18 MED ORDER — BENZONATATE 100 MG PO CAPS: 100.0000 mg | ORAL_CAPSULE | Freq: Three times a day (TID) | ORAL | 0 refills | Status: AC | PRN

## 2022-12-18 MED ORDER — CEFUROXIME AXETIL 500 MG PO TABS: 500.0000 mg | ORAL_TABLET | Freq: Two times a day (BID) | ORAL | 0 refills | Status: AC

## 2022-12-18 NOTE — Evaluation (Signed)
Occupational Therapy Evaluation Patient Details Name: Tim Henry MRN: [id number] DOB: [birth year] Today's Date: 12/18/2022   History of Present Illness Pt is 89 year old presented to [facility] from [facility] on  8/17 for covid and hypotension. Pt with septic shock and initially required pressors. PMH - HTN, DM, SHD, craniotomy, peripheral neuropathy   Clinical Impression   Tim Henry was evaluated s/p the above admission list. Pt reports he is generally mod I at baseline and has 24/7 assist from family. Upon evaluation the pt was limited by generalized weakness, impaired cognition, low vision, unsteady gait, poor safety awareness and decreased activity tolerance. Overall he needed min A for bed mobility and transfers and CGA with cues for room ambulation with RW. Due to the deficits listed below the pt also needs up to mod A for LB ADLs and set up A for UB ADLs. Pt required maximal encouragement to participate this date and was left with pills on his tray table that he said he has "no idea" when he is supposed to take them - plan to further assess cognition next session. Pt will benefit from continued acute OT services and 24/7 direct supervision A from family with HHOT.         If plan is discharge home, recommend the following: A little help with walking and/or transfers;A lot of help with bathing/dressing/bathroom;Assistance with cooking/housework;Direct supervision/assist for medications management;Direct supervision/assist for financial management;Assist for transportation;Help with stairs or ramp for entrance    Functional Status Assessment  Patient has had a recent decline in their functional status and demonstrates the ability to make significant improvements in function in a reasonable and predictable amount of time.  Equipment Recommendations  None recommended by OT    Recommendations for Other Services       Precautions / Restrictions Precautions Precautions: Fall;Other  (comment) Precaution Comments: covid Restrictions Weight Bearing Restrictions: No      Mobility Bed Mobility Overal bed mobility: Needs Assistance Bed Mobility: Supine to Sit     Supine to sit: Min assist     General bed mobility comments: min A to bring hips towards EOB    Transfers Overall transfer level: Needs assistance Equipment used: Rolling walker (2 wheels) Transfers: Sit to/from Stand Sit to Stand: Min assist                  Balance Overall balance assessment: Needs assistance Sitting-balance support: No upper extremity supported, Feet supported Sitting balance-Leahy Scale: Good     Standing balance support: Bilateral upper extremity supported, During functional activity Standing balance-Leahy Scale: Poor                             ADL either performed or assessed with clinical judgement   ADL Overall ADL's : Needs assistance/impaired Eating/Feeding: Independent   Grooming: Set up;Sitting   Upper Body Bathing: Set up;Sitting   Lower Body Bathing: Moderate assistance;Sit to/from stand   Upper Body Dressing : Set up;Sitting   Lower Body Dressing: Moderate assistance;Sit to/from stand   Toilet Transfer: Minimal assistance;Ambulation;Rolling walker (2 wheels)   Toileting- Clothing Manipulation and Hygiene: Contact guard assist;Sitting/lateral lean       Functional mobility during ADLs: Minimal assistance;Cueing for safety;Rolling walker (2 wheels) General ADL Comments: Significantly increased time needed, cues for safety and vision, encouragemetn for participation. limited by back pain adn weakness     Vision   Additional Comments: seeminly low vision at baseline, pt requires  all of the lights on and blind open to safety navigate room. Therapist standing to pt's L and he stated "Where are you."     Perception Perception: Not tested       Praxis Praxis: Not tested       Pertinent Vitals/Pain Pain Assessment Pain  Assessment: Faces Faces Pain Scale: Hurts little more Pain Location: back Pain Descriptors / Indicators: Discomfort Pain Intervention(s): Limited activity within patient's tolerance, Monitored during session     Extremity/Trunk Assessment Upper Extremity Assessment Upper Extremity Assessment: Generalized weakness   Lower Extremity Assessment Lower Extremity Assessment: Defer to PT evaluation   Cervical / Trunk Assessment Cervical / Trunk Assessment: Kyphotic   Communication Communication Communication: Hearing impairment   Cognition Arousal: Alert Behavior During Therapy: WFL for tasks assessed/performed Overall Cognitive Status: No family/caregiver present to determine baseline cognitive functioning                                 General Comments: Overall WFL for basic tasks. Required significant encouragement to get OOB to eat breakfast. Noted pills on pt's table and he reports he has "no idea" when he his supposed to take them. RN made aware.     General Comments       Exercises     Shoulder Instructions      Home Living Family/patient expects to be discharged to:: Private residence Living Arrangements: Children Available Help at Discharge: Family;Available 24 hours/day Type of Home: House Home Access: Stairs to enter [employer] of Steps: 3 steps Entrance Stairs-Rails: Right Home Layout: One level     Bathroom Shower/Tub: Chief Strategy Officer: Standard Bathroom Accessibility: Yes   Home Equipment: Agricultural consultant (2 wheels);Rollator (4 wheels);Shower seat;Grab bars - toilet;Grab bars - tub/shower          Prior Functioning/Environment Prior Level of Function : Independent/Modified Independent             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 Does not drive ADLs Comments: reports mod I        OT Problem List: Decreased strength;Decreased range of motion;Decreased activity tolerance;Impaired  balance (sitting and/or standing);Decreased cognition;Decreased safety awareness;Decreased knowledge of use of DME or AE;Decreased knowledge of precautions      OT Treatment/Interventions: Self-care/ADL training;DME and/or AE instruction;Therapeutic activities;Balance training;Patient/family education    OT Goals(Current goals can be found in the care plan section) Acute Rehab OT Goals Patient Stated Goal: to eat OT Goal Formulation: With patient Time For Goal Achievement: 01/01/23 Potential to Achieve Goals: Good ADL Goals Pt Will Perform Lower Body Dressing: with supervision;sit to/from stand Pt Will Transfer to Toilet: with supervision;ambulating Additional ADL Goal #1: Pt will independently navigate hospital environment without safety concern Additional ADL Goal #2: Pt will complete IADL medicaiton management task with minimal cues only  OT Frequency: Min 1X/week    Co-evaluation              AM-PAC OT "6 Clicks" Daily Activity     Outcome Measure Help from another person eating meals?: None Help from another person taking care of personal grooming?: A Little Help from another person toileting, which includes using toliet, bedpan, or urinal?: A Little Help from another person bathing (including washing, rinsing, drying)?: A Lot Help from another person to put on and taking off regular upper body clothing?: A Little Help from another person to put on and taking off regular lower body  clothing?: A Lot 6 Click Score: 17   End of Session Equipment Utilized During Treatment: Gait belt;Rolling walker (2 wheels) Nurse Communication: Mobility status (pt with meds on tray and reports he does not know when he is supposed to take them)  Activity Tolerance: Patient tolerated treatment well Patient left: in chair;with call bell/phone within reach;with chair alarm set  OT Visit Diagnosis: Unsteadiness on feet (R26.81);Other abnormalities of gait and mobility (R26.89);Muscle weakness  (generalized) (M62.81);Low vision, both eyes (H54.2)                Time: 1008-1030 OT Time Calculation (min): 22 min Charges:  OT General Charges $OT Visit: 1 Visit OT Evaluation $OT Eval Moderate Complexity: 1 Mod  Derenda Mis, OTR/L Acute Rehabilitation Services Office [phone] Secure Chat Communication Preferred   Donia Pounds 12/18/2022, 10:48 AM

## 2022-12-18 NOTE — Inpatient Diabetes Management (Signed)
Inpatient Diabetes Program Recommendations  AACE/ADA: New Consensus Statement on Inpatient Glycemic Control   Target Ranges:  Prepandial:   less than 140 mg/dL      Peak postprandial:   less than 180 mg/dL (1-2 hours)      Critically ill patients:  140 - 180 mg/dL    Latest Reference Range & Units 12/18/22 00:30 12/18/22 04:34 12/18/22 08:06 12/18/22 09:43  Glucose-Capillary 70 - 99 mg/dL 098 (H) 119 (H) 147 (H) 224 (H)    Latest Reference Range & Units 12/17/22 04:45 12/17/22 07:50 12/17/22 12:16 12/17/22 17:18 12/17/22 19:28  Glucose-Capillary 70 - 99 mg/dL 829 (H) 562 (H) 130 (H) 214 (H) 243 (H)   Review of Glycemic Control  Diabetes history: DM2 Outpatient Diabetes medications: Metformin 500 mg BID, Januvia 100 mg daily Current orders for Inpatient glycemic control: Novolog 0-15 units TID with meals, Novolog 0-5 units at bedtime, Novolog 3 units TID with meals; Decadron 6 Q24H  Inpatient Diabetes Program Recommendations:    Insulin: If steroids are continued as ordered, please consider Semglee 8 units daily and increasing meal coverage to Novolog 5 units TID with meals.  Thanks, Orlando Penner, RN, MSN, CDCES Diabetes Coordinator Inpatient Diabetes Program [phone] (Team Pager from 8am to 5pm)

## 2022-12-18 NOTE — Progress Notes (Signed)
Physical Therapy Treatment Patient Details Name: Tim Henry MRN: [id number] DOB: Feb 05, 1928 Today's Date: 12/18/2022   History of Present Illness Pt is 89 year old presented to [facility] from [facility] on  8/17 for covid and hypotension. Pt with septic shock and initially required pressors. PMH - HTN, DM, SHD, craniotomy, peripheral neuropathy    PT Comments  Pt greeted up in chair and agreeable to session with continued progress towards acute goals. Pt requiring min A to power up to stand with pt demonstrating good carryover for safe hand placement and fair problem solving for optimal foot placement on rise. Pt able to progress gait distance with RW support and CGA for safety with pt requiring repeated verbal cues for RW proximity and upright posture. Pt verbose throughout session needing frequent redirection to task at hand with pt demonstrating cognitive deficits with poor recall throughout session. Current plan remains appropriate to address deficits and maximize functional independence and safety. Pt continues to benefit from skilled PT services to progress toward functional mobility goals.     If plan is discharge home, recommend the following: A little help with walking and/or transfers;Assist for transportation;Help with stairs or ramp for entrance;Assistance with cooking/housework   Can travel by private vehicle        Equipment Recommendations  None recommended by PT    Recommendations for Other Services       Precautions / Restrictions Precautions Precautions: Fall;Other (comment) Precaution Comments: covid Restrictions Weight Bearing Restrictions: No     Mobility  Bed Mobility Overal bed mobility: Needs Assistance             General bed mobility comments: up in chair per and post session    Transfers Overall transfer level: Needs assistance Equipment used: Rolling walker (2 wheels) Transfers: Sit to/from Stand Sit to Stand: Min assist           General  transfer comment: Assist to power up. good hand placement and problem solving for optimal foot placement    Ambulation/Gait Ambulation/Gait assistance: Contact guard assist Gait Distance (Feet): 50 Feet Assistive device: Rolling walker (2 wheels) Gait Pattern/deviations: Step-through pattern, Decreased step length - right, Decreased step length - left, Trunk flexed Gait velocity: variable     General Gait Details: Assist for safety. Verbal cues to stay closer to walker and stand more upright, improved gait speed with upright posture   Stairs             Wheelchair Mobility     Tilt Bed    Modified Rankin (Stroke Patients Only)       Balance Overall balance assessment: Needs assistance Sitting-balance support: No upper extremity supported, Feet supported Sitting balance-Leahy Scale: Good     Standing balance support: Bilateral upper extremity supported, During functional activity Standing balance-Leahy Scale: Poor                              Cognition Arousal: Alert Behavior During Therapy: WFL for tasks assessed/performed Overall Cognitive Status: No family/caregiver present to determine baseline cognitive functioning                                 General Comments: Overall WFL for basic tasks. hyper verbose needing redirection to task        Exercises      General Comments General comments (skin integrity, edema, etc.): VSS on  RA      Pertinent Vitals/Pain Pain Assessment Pain Assessment: Faces Faces Pain Scale: Hurts little more Pain Location: back Pain Descriptors / Indicators: Discomfort Pain Intervention(s): Monitored during session, Limited activity within patient's tolerance    Home Living Family/patient expects to be discharged to:: Private residence Living Arrangements: Children Available Help at Discharge: Family;Available 24 hours/day Type of Home: House Home Access: Stairs to enter Entrance Stairs-Rails:  Right Entrance Stairs-Number of Steps: 3 steps   Home Layout: One level Home Equipment: Agricultural consultant (2 wheels);Rollator (4 wheels);Shower seat;Grab bars - toilet;Grab bars - tub/shower      Prior Function            PT Goals (current goals can now be found in the care plan section) Acute Rehab PT Goals PT Goal Formulation: With patient Time For Goal Achievement: 12/31/22 Progress towards PT goals: Progressing toward goals    Frequency    Min 1X/week      PT Plan      Co-evaluation              AM-PAC PT "6 Clicks" Mobility   Outcome Measure  Help needed turning from your back to your side while in a flat bed without using bedrails?: None Help needed moving from lying on your back to sitting on the side of a flat bed without using bedrails?: A Little Help needed moving to and from a bed to a chair (including a wheelchair)?: A Little Help needed standing up from a chair using your arms (e.g., wheelchair or bedside chair)?: A Little Help needed to walk in hospital room?: A Little Help needed climbing 3-5 steps with a railing? : Total 6 Click Score: 17    End of Session Equipment Utilized During Treatment: Gait belt Activity Tolerance: Patient tolerated treatment well Patient left: in chair;with call bell/phone within reach;with chair alarm set Nurse Communication: Mobility status PT Visit Diagnosis: Other abnormalities of gait and mobility (R26.89);Muscle weakness (generalized) (M62.81)     Time: 0347-4259 PT Time Calculation (min) (ACUTE ONLY): 29 min  Charges:    $Gait Training: 8-22 mins $Therapeutic Activity: 8-22 mins PT General Charges $$ ACUTE PT VISIT: 1 Visit                     Abu Heavin R. PTA Acute Rehabilitation Services Office: [phone]   Catalina Antigua 12/18/2022, 1:29 PM

## 2022-12-18 NOTE — Discharge Summary (Signed)
Physician Discharge Summary   Patient: Tim Henry MRN: [id number] DOB: 1927-05-15  Admit date:     12/15/2022  Discharge date: 12/18/22  Discharge Physician: Arnetha Courser   PCP: Aniceto Boss, MD   Recommendations at discharge:  Please obtain CBC and BMP in 1 week Please ensure the completion of antibiotics Follow-up with primary care provider within a week.  Discharge Diagnoses: Principal Problem:   Septic shock (HCC) Active Problems:   COVID-19 virus infection   Type 2 diabetes mellitus with complication (HCC)   Diabetic peripheral neuropathy (HCC)   Essential hypertension   BPH (benign prostatic hyperplasia)   CKD stage G3b/A1, GFR 30-44 and albumin creatinine ratio <30 mg/g [facility])   Hospital Course: PCCM transfer for 12/17/2022.  89 year old man who presented to [facility] 8/17 as a transfer from [facility] for hypotension, suspected sepsis in the setting of COVID-19 infection. PMHx significant for HTN, HLD, T2DM, SDH (2015, s/p fall requiring craniotomy), GERD, anxiety/depression, mild dementia. Recently seen in ED 7/1 for mechanical fall with which he hit his head; CT Head NAICA and he was discharged home.   Patient presented to [facility] 8/17 with generalized weakness/lethargy. Tested positive for COVID 8/16.  He was found to be hypotensive with inadequate response to IV fluid, patient was started on vasopressor, left femoral CVC was placed by EDP at [facility].  Started on broad-spectrum antibiotics and blood cultures were obtained.  Patient was transferred to [facility] for further care.  Later transitioned off from vasopressors and care is being transferred to [facility].  Patient was started on Decadron and remdesivir in ICU.  8/19: Vital stable, remained afebrile.  Completed 3 doses of remdesivir today.  D-dimer elevated, ordered lower extremity venous Doppler, remained on room air increasing CRP at 9.1. Still feeling weak and would like to spend another night. PT/OT evaluation ordered  8/20: Overnight  some concern of mild hemoptysis, VQ scan was ordered by night on-call person and it was negative for concern of PE.  Persistent leukocytosis, procalcitonin at 0.31, improving CRP.  Starting on cefuroxime and Doxy for 5 days with concern of superadded bacterial infection.  Lower extremity venous Doppler was also negative for DVT.  Clinically stable.  Home health ordered.  Patient was given 4 more days of Decadron and 55 days old antibiotics. Blood pressure now elevated, patient can resume his home medications on discharge.  Patient will continue his home medications and complete a 5-day course of antibiotic. Patient will follow-up with primary care provider for further recommendations.  Assessment and Plan: * Septic shock [facility]) Patient met sepsis criteria, secondary to COVID infection.  Hypotensive initially requiring pressors, now weaned of from pressor. -Continue to monitor -Completed 3 days of remdesivir -Continue with Decadron  COVID-19 virus infection Patient was given Paxlovid as outpatient which was not continued. Received 3 doses of remdesivir in ICU CRP improving, lower extremity venous Doppler and VQ scan was negative for PE or DVT Overnight mild blood-tinged sputum, persistent leukocytosis and positive procalcitonin, likely a superadded bacterial conversion. -Started on doxycycline  and Ceftin for 5 days -Continue with Decadron -Continue with supportive care  Type 2 diabetes mellitus with complication (HCC) CBG elevated, patient is on steroid. -Increase the strength of SSI -Adding 3 units with meal  Diabetic peripheral neuropathy (HCC) -Continue home Neurontin  Essential hypertension Blood pressure currently within goal.  On multiple antihypertensives at home which are being held due to softer blood pressure on admission requiring pressors. -Continue to monitor-we will add antihypertensives as  needed  BPH (benign prostatic hyperplasia) -Continue home finasteride  CKD  stage G3b/A1, GFR 30-44 and albumin creatinine ratio <30 mg/g (HCC) Patient did had mild AKI, which has been improved and currently creatinine at baseline. -Monitor renal function -Avoid nephrotoxins   Consultants: PCCM Procedures performed: None Disposition: Home health Diet recommendation:  Discharge Diet Orders (From admission, onward)     Start     Ordered   12/18/22 0000  Diet - low sodium heart healthy        12/18/22 1251           Cardiac and Carb modified diet DISCHARGE MEDICATION: Allergies as of 12/18/2022   No Known Allergies      Medication List     STOP taking these medications    Paxlovid (300/100) 20 x 150 MG & 10 x 100MG  Tbpk Generic drug: nirmatrelvir & ritonavir       TAKE these medications    acetaminophen 500 MG tablet Commonly known as: TYLENOL Take 500 mg by mouth 2 (two) times daily as needed for fever, headache, moderate pain or mild pain.   ALPRAZolam 0.5 MG tablet Commonly known as: XANAX Take 0.5 mg by mouth 2 (two) times daily.   aspirin EC 81 MG tablet Take 1 tablet (81 mg total) by mouth daily with breakfast. Swallow whole. What changed: additional instructions   benzonatate 100 MG capsule Commonly known as: TESSALON Take 1 capsule (100 mg total) by mouth 3 (three) times daily as needed for cough.   BLACK ELDERBERRY PO Take 1 capsule by mouth at bedtime.   carvedilol 6.25 MG tablet Commonly known as: COREG Take 6.25 mg by mouth 2 (two) times daily with a meal.   cefUROXime 500 MG tablet Commonly known as: CEFTIN Take 1 tablet (500 mg total) by mouth 2 (two) times daily with a meal for 5 days.   dexamethasone 6 MG tablet Commonly known as: DECADRON Take 1 tablet (6 mg total) by mouth daily for 4 days.   diphenhydrAMINE 25 MG tablet Commonly known as: BENADRYL Take 25 mg by mouth at bedtime.   doxycycline 100 MG tablet Commonly known as: VIBRA-TABS Take 1 tablet (100 mg total) by mouth every 12 (twelve) hours  for 5 days.   ferrous sulfate 325 (65 FE) MG tablet Take 325 mg by mouth at bedtime.   finasteride 5 MG tablet Commonly known as: PROSCAR Take 1 tablet (5 mg total) by mouth daily.   furosemide 20 MG tablet Commonly known as: LASIX Take 1 tablet (20 mg total) by mouth daily. What changed: how much to take   gabapentin 300 MG capsule Commonly known as: NEURONTIN Take 300 mg by mouth 2 (two) times daily with a meal. 1300, 2300   GLUCOSAMINE PO Take 2 tablets by mouth at bedtime.   guaiFENesin 600 MG 12 hr tablet Commonly known as: MUCINEX Take 1 tablet (600 mg total) by mouth 2 (two) times daily as needed for cough or to loosen phlegm.   metFORMIN 500 MG tablet Commonly known as: GLUCOPHAGE Take 1 tablet (500 mg total) by mouth 2 (two) times daily with a meal. 1 PM, 11 PM What changed: additional instructions   multivitamin with minerals tablet Take 1 tablet by mouth at bedtime.   OIL OF OREGANO PO Take 1 capsule by mouth at bedtime.   omeprazole 40 MG capsule Commonly known as: PRILOSEC Take 40 mg by mouth daily.   PHOSPHATIDYL CHOLINE PO Take 2 tablets by mouth at bedtime.  potassium chloride 10 MEQ tablet Commonly known as: KLOR-CON Take 20 mEq by mouth daily.   PROBIOTIC DAILY PO Take 1 capsule by mouth at bedtime.   sertraline 100 MG tablet Commonly known as: ZOLOFT Take 100 mg by mouth daily.   simvastatin 10 MG tablet Commonly known as: ZOCOR Take 10 mg by mouth at bedtime.   sitaGLIPtin 100 MG tablet Commonly known as: JANUVIA Take 100 mg by mouth daily.   SPIRULINA PO Take 1 tablet by mouth at bedtime.   sucralfate 1 g tablet Commonly known as: CARAFATE Take 1 g by mouth 2 (two) times daily.   tamsulosin 0.4 MG Caps capsule Commonly known as: FLOMAX Take 1 capsule (0.4 mg total) by mouth daily after breakfast. 1 PM What changed: additional instructions   telmisartan 20 MG tablet Commonly known as: MICARDIS Take 20 mg by mouth  daily.   verapamil 240 MG CR tablet Commonly known as: CALAN-SR Take 240 mg by mouth daily.   Vitamin D (Ergocalciferol) 1.25 MG (50000 UNIT) Caps capsule Commonly known as: DRISDOL Take 50,000 Units by mouth every Wednesday.        Follow-up Information     Aniceto Boss, MD. Schedule an appointment as soon as possible for a visit in 1 week(s).   Specialty: Family Medicine Contact information: [address] [place] [address] [phone]                Discharge Exam: Filed Weights   12/15/22 1557 12/16/22 0432 12/17/22 0800  Weight: 102 kg 98.9 kg 100.1 kg   General.  Frail elderly man, in no acute distress. Pulmonary.  Lungs clear bilaterally, normal respiratory effort. CV.  Regular rate and rhythm, no JVD, rub or murmur. Abdomen.  Soft, nontender, nondistended, BS positive. CNS.  Alert and oriented .  No focal neurologic deficit. Extremities.  No edema, no cyanosis, pulses intact and symmetrical. Psychiatry.  Judgment and insight appears normal.   Condition at discharge: stable  The results of significant diagnostics from this hospitalization (including imaging, microbiology, ancillary and laboratory) are listed below for reference.   Imaging Studies: NM Pulmonary Perfusion  Result Date: 12/18/2022 CLINICAL DATA:  Weakness, bloody sputum EXAM: NUCLEAR MEDICINE PERFUSION LUNG SCAN TECHNIQUE: Perfusion images were obtained in multiple projections after intravenous injection of radiopharmaceutical. Ventilation scans intentionally deferred if perfusion scan and chest x-ray adequate for interpretation during COVID 19 epidemic. RADIOPHARMACEUTICALS:  4.4 mCi Tc-56m MAA IV COMPARISON:  X-ray 12/18/2022 FINDINGS: Radiotracer is distributed throughout both lungs. No segmental perfusion defect within either lung. IMPRESSION: No scintigraphic evidence for pulmonary embolism. Electronically Signed   By: Duanne Guess D.O.   On: 12/18/2022 11:07   DG CHEST PORT 1  VIEW  Result Date: 12/18/2022 CLINICAL DATA:  89 year old male with history of weakness and hypotension. EXAM: PORTABLE CHEST 1 VIEW COMPARISON:  Chest x-ray 12/16/2022. FINDINGS: Interstitial prominence, peribronchial cuffing and poorly defined airspace disease noted throughout the left mid to lower lung, overall slightly improved compared to the prior study. Probable trace left pleural effusion, decreased compared to the prior study. Right lung is well aerated. No right pleural effusion. No pneumothorax. No evidence of pulmonary edema. Heart size is normal. Upper mediastinal contours are within normal limits. Atherosclerotic calcifications in the thoracic aorta. IMPRESSION: 1. Resolving left lower lobe pneumonia with decreasing small left parapneumonic pleural effusion. 2. Aortic atherosclerosis. Electronically Signed   By: Trudie Reed M.D.   On: 12/18/2022 07:30   VAS [place] LOWER EXTREMITY VENOUS (  DVT)  Result Date: 12/17/2022  Lower Venous DVT Study Patient Name:  KONSTANTY SEIDNER  Date of Exam:   12/17/2022 Medical Rec #: [id number]        Accession #:    [id number] Date of Birth: 07-26-1927        Patient Gender: M Patient Age:   38 years Exam Location:  [facility] Procedure:      VAS [place] LOWER EXTREMITY VENOUS (DVT) Referring Phys: Tilman Neat Eagan Shifflett --------------------------------------------------------------------------------  Indications: Elevated d-dimer, pain.  Limitations: Body habitus, poor ultrasound/tissue interface and patient positioning. Comparison Study: No previous study. Performing Technologist: McKayla Maag RVT, [place]  Examination Guidelines: A complete evaluation includes B-mode imaging, spectral Doppler, color Doppler, and power Doppler as needed of all accessible portions of each vessel. Bilateral testing is considered an integral part of a complete examination. Limited examinations for reoccurring indications may be performed as noted. The reflux portion of the exam is performed with  the patient in reverse Trendelenburg.  +---------+---------------+---------+-----------+----------+--------------+ RIGHT    CompressibilityPhasicitySpontaneityPropertiesThrombus Aging +---------+---------------+---------+-----------+----------+--------------+ CFV      Full           Yes      Yes                                 +---------+---------------+---------+-----------+----------+--------------+ SFJ      Full                                                        +---------+---------------+---------+-----------+----------+--------------+ FV Prox  Full                                                        +---------+---------------+---------+-----------+----------+--------------+ FV Mid   Full                                                        +---------+---------------+---------+-----------+----------+--------------+ FV DistalFull                                                        +---------+---------------+---------+-----------+----------+--------------+ PFV      Full                                                        +---------+---------------+---------+-----------+----------+--------------+ POP      Full           Yes      Yes                                 +---------+---------------+---------+-----------+----------+--------------+ PTV  Full                                                        +---------+---------------+---------+-----------+----------+--------------+ PERO     Full                                         limited        +---------+---------------+---------+-----------+----------+--------------+   +---------+---------------+---------+-----------+----------+--------------+ LEFT     CompressibilityPhasicitySpontaneityPropertiesThrombus Aging +---------+---------------+---------+-----------+----------+--------------+ CFV                                                   Not visualized  +---------+---------------+---------+-----------+----------+--------------+ SFJ                                                   Not visualized +---------+---------------+---------+-----------+----------+--------------+ FV Prox  Full                                                        +---------+---------------+---------+-----------+----------+--------------+ FV Mid   Full                                                        +---------+---------------+---------+-----------+----------+--------------+ FV DistalFull                                                        +---------+---------------+---------+-----------+----------+--------------+ PFV      Full                                                        +---------+---------------+---------+-----------+----------+--------------+ POP      Full           Yes      Yes                                 +---------+---------------+---------+-----------+----------+--------------+ PTV      Full                                                        +---------+---------------+---------+-----------+----------+--------------+ PERO     Full  limited        +---------+---------------+---------+-----------+----------+--------------+     Summary: RIGHT: - There is no evidence of deep vein thrombosis in the lower extremity. However, portions of this examination were limited- see technologist comments above.  - No cystic structure found in the popliteal fossa.  LEFT: - There is no evidence of deep vein thrombosis in the lower extremity. However, portions of this examination were limited- see technologist comments above.  - No cystic structure found in the popliteal fossa.  *See table(s) above for measurements and observations. Electronically signed by Sherald Hess MD on 12/17/2022 at 6:25:27 PM.    Final    ECHOCARDIOGRAM COMPLETE  Result Date: 12/16/2022    ECHOCARDIOGRAM  REPORT   Patient Name:   TREGG ETTERS Date of Exam: 12/16/2022 Medical Rec #:  [id number]       Height:       71.0 in Accession #:    [id number]      Weight:       218.0 lb Date of Birth:  [birth year]       BSA:          2.187 m Patient Age:    89 years        BP:           110/61 mmHg Patient Gender: M               HR:           73 bpm. Exam Location:  Inpatient Procedure: 2D Echo, Cardiac Doppler and Color Doppler Indications:    Acute Respiratory Distress R06.03  History:        Patient has no prior history of Echocardiogram examinations.                 Risk Factors:Hypertension and Diabetes.  Sonographer:    Harriette Bouillon RDCS Referring Phys: 95 MURALI RAMASWAMY IMPRESSIONS  1. Left ventricular ejection fraction, by estimation, is 60 to 65%. The left ventricle has normal function. The left ventricle has no regional wall motion abnormalities. Left ventricular diastolic parameters are consistent with Grade I diastolic dysfunction (impaired relaxation). Elevated left atrial pressure.  2. Right ventricular systolic function is normal. The right ventricular size is normal.  3. The mitral valve is normal in structure. No evidence of mitral valve regurgitation. No evidence of mitral stenosis.  4. The aortic valve is tricuspid. Aortic valve regurgitation is trivial. Aortic valve sclerosis is present, with no evidence of aortic valve stenosis.  5. Aortic dilatation noted. There is borderline dilatation of the aortic root, measuring 39 mm.  6. The inferior vena cava is normal in size with greater than 50% respiratory variability, suggesting right atrial pressure of 3 mmHg. Comparison(s): No prior Echocardiogram. FINDINGS  Left Ventricle: Left ventricular ejection fraction, by estimation, is 60 to 65%. The left ventricle has normal function. The left ventricle has no regional wall motion abnormalities. The left ventricular internal cavity size was normal in size. There is  no left ventricular hypertrophy. Left  ventricular diastolic parameters are consistent with Grade I diastolic dysfunction (impaired relaxation). Elevated left atrial pressure. Right Ventricle: The right ventricular size is normal. Right ventricular systolic function is normal. Left Atrium: Left atrial size was normal in size. Right Atrium: Right atrial size was normal in size. Pericardium: There is no evidence of pericardial effusion. Mitral Valve: The mitral valve is normal in structure. Mild mitral annular calcification. No evidence of mitral valve regurgitation. No evidence of mitral valve  stenosis. Tricuspid Valve: The tricuspid valve is normal in structure. Tricuspid valve regurgitation is mild . No evidence of tricuspid stenosis. Aortic Valve: The aortic valve is tricuspid. Aortic valve regurgitation is trivial. Aortic valve sclerosis is present, with no evidence of aortic valve stenosis. Pulmonic Valve: The pulmonic valve was not well visualized. Pulmonic valve regurgitation is not visualized. No evidence of pulmonic stenosis. Aorta: Aortic dilatation noted. There is borderline dilatation of the aortic root, measuring 39 mm. Venous: The inferior vena cava is normal in size with greater than 50% respiratory variability, suggesting right atrial pressure of 3 mmHg. IAS/Shunts: No atrial level shunt detected by color flow Doppler.  LEFT VENTRICLE PLAX 2D LVIDd:         4.50 cm   Diastology LVIDs:         2.90 cm   LV e' medial:    5.66 cm/s LV PW:         0.90 cm   LV E/e' medial:  17.6 LV IVS:        0.90 cm   LV e' lateral:   6.64 cm/s LVOT diam:     2.00 cm   LV E/e' lateral: 15.0 LV SV:         52 LV SV Index:   24 LVOT Area:     3.14 cm  IVC IVC diam: 1.70 cm LEFT ATRIUM         Index LA diam:    3.40 cm 1.55 cm/m  AORTIC VALVE LVOT Vmax:   80.30 cm/s LVOT Vmean:  56.000 cm/s LVOT VTI:    0.165 m  AORTA Ao Root diam: 3.90 cm Ao Asc diam:  3.70 cm MITRAL VALVE MV Area (PHT): 2.66 cm     SHUNTS MV Decel Time: 285 msec     Systemic VTI:  0.16 m  MV E velocity: 99.40 cm/s   Systemic Diam: 2.00 cm MV A velocity: 127.00 cm/s MV E/A ratio:  0.78 Olga Millers MD Electronically signed by Olga Millers MD Signature Date/Time: 12/16/2022/3:28:13 PM    Final    DG Chest Port 1 View  Result Date: 12/16/2022 CLINICAL DATA:  COVID infection. EXAM: PORTABLE CHEST 1 VIEW COMPARISON:  12/15/2022 FINDINGS: Interval development of diffuse hazy airspace opacity in the left mid lung with increased retrocardiac airspace disease. Right lung remains relatively clear. Cardiopericardial silhouette is at upper limits of normal for size. No acute bony abnormality. Telemetry leads overlie the chest. IMPRESSION: Interval development of diffuse hazy airspace opacity in the left mid lung with increased retrocardiac airspace disease. Imaging features compatible with pneumonia. Electronically Signed   By: Kennith Center M.D.   On: 12/16/2022 07:40   DG Chest Portable 1 View  Result Date: 12/15/2022 CLINICAL DATA:  COVID, weakness EXAM: PORTABLE CHEST 1 VIEW COMPARISON:  05/19/2022 FINDINGS: Cardiomegaly. Both lungs are clear. The visualized skeletal structures are unremarkable. IMPRESSION: Cardiomegaly without acute abnormality of the lungs in low volume AP portable projection. Electronically Signed   By: Jearld Lesch M.D.   On: 12/15/2022 17:15    Microbiology: Results for orders placed or performed during the hospital encounter of 12/15/22  SARS Coronavirus 2 by RT PCR (hospital order, performed in [facility] hospital lab) *cepheid single result test* Anterior Nasal Swab     Status: Abnormal   Collection Time: 12/15/22  4:22 PM   Specimen: Anterior Nasal Swab  Result Value Ref Range Status   SARS Coronavirus 2 by RT PCR POSITIVE (A) NEGATIVE Final  Comment: (NOTE) SARS-CoV-2 target nucleic acids are DETECTED  SARS-CoV-2 RNA is generally detectable in upper respiratory specimens  during the acute phase of infection.  Positive results are indicative  of the  presence of the identified virus, but do not rule out bacterial infection or co-infection with other pathogens not detected by the test.  Clinical correlation with patient history and  other diagnostic information is necessary to determine patient infection status.  The expected result is negative.  Fact Sheet for Patients:   RoadLapTop.co.za   Fact Sheet for Healthcare Providers:   http://kim-miller.com/    This test is not yet approved or cleared by the [place] FDA and  has been authorized for detection and/or diagnosis of SARS-CoV-2 by FDA under an Emergency Use Authorization (EUA).  This EUA will remain in effect (meaning this test can be used) for the duration of  the COVID-19 declaration under Section 564(b)(1)  of the Act, 21 U.S.C. section 360-bbb-3(b)(1), unless the authorization is terminated or revoked sooner.   Performed at [facility], [address], [place], [place] [address]   Blood culture (routine x 2)     Status: None (Preliminary result)   Collection Time: 12/15/22  8:02 PM   Specimen: BLOOD RIGHT HAND  Result Value Ref Range Status   Specimen Description BLOOD RIGHT HAND BLOOD  Final   Special Requests   Final    BOTTLES DRAWN AEROBIC AND ANAEROBIC Blood Culture adequate volume   Culture   Final    NO GROWTH 3 DAYS Performed at [facility], [address], [place], [place] [address]    Report Status PENDING  Incomplete  MRSA Next Gen by PCR, Nasal     Status: None   Collection Time: 12/15/22  9:00 PM   Specimen: Nasal Mucosa; Nasal Swab  Result Value Ref Range Status   MRSA by PCR Next Gen NOT DETECTED NOT DETECTED Final    Comment: (NOTE) The GeneXpert MRSA Assay (FDA approved for NASAL specimens only), is one component of a comprehensive MRSA colonization surveillance program. It is not intended to diagnose MRSA infection nor to guide or monitor treatment for MRSA infections. Test performance is not  FDA approved in patients less than 6 years old. Performed at [facility], [address] [place], [place] [address]     Labs: CBC: Recent Labs  Lab 12/15/22 1544 12/16/22 0442 12/17/22 0451 12/18/22 0630  WBC 10.9* 14.1* 13.3* 13.2*  HGB 11.1* 10.9* 10.8* 10.8*  HCT 34.8* 34.1* 33.7* 33.2*  MCV 103.9* 99.4 98.5 97.6  PLT 141* 141* 157 177   Basic Metabolic Panel: Recent Labs  Lab 12/15/22 1548 12/16/22 0442 12/17/22 0451 12/18/22 0630  NA 136 136 136 135  K 4.0 3.7 4.1 4.0  CL 103 103 105 103  CO2 22 21* 23 23  GLUCOSE 242* 147* 186* 241*  BUN 29* 28* 33* 34*  CREATININE 1.97* 1.86* 1.47* 1.37*  CALCIUM 8.5* 8.4* 8.3* 8.3*  MG  --  1.5* 2.4  --   PHOS  --  2.8 3.3  --    Liver Function Tests: Recent Labs  Lab 12/15/22 1548 12/17/22 0451 12/18/22 0630  AST 20 38 28  ALT 12 50* 41  ALKPHOS 54 63 57  BILITOT 0.8 0.8 0.7  PROT 6.6 5.8* 5.6*  ALBUMIN 3.4* 2.7* 2.7*   CBG: Recent Labs  Lab 12/18/22 0030 12/18/22 0434 12/18/22 0806 12/18/22 0943 12/18/22 1202  GLUCAP 188* 219* 201* 224* 256*  Discharge time spent: greater than 30 minutes.  This record has been created using Conservation officer, historic buildings. Errors have been sought and corrected,but may not always be located. Such creation errors do not reflect on the standard of care.   Signed: Arnetha Courser, MD [facility] 12/18/2022

## 2022-12-18 NOTE — Progress Notes (Signed)
Due to his age and associated adverse effects with quinolones, we will change levaquin consult to doxy/cefuroxime x5 days after discussion with Dr. Shanda Bumps.  Ulyses Southward, PharmD, BCIDP, AAHIVP, CPP Infectious Disease Pharmacist 12/18/2022 8:53 AM

## 2022-12-18 NOTE — Progress Notes (Signed)
OT Cancellation Note  Patient Details Name: ITAY AVELLINO MRN: [id number] DOB: 1928-04-24   Cancelled Treatment:    Reason Eval/Treat Not Completed: Patient at procedure or test/ unavailable (OTF upon arrival - OT to f/u when pt returns to unit.)  Donia Pounds 12/18/2022, 8:24 AM

## 2022-12-18 NOTE — Assessment & Plan Note (Signed)
Patient was given Paxlovid as outpatient which was not continued. Received 3 doses of remdesivir in ICU CRP improving, lower extremity venous Doppler and VQ scan was negative for PE or DVT Overnight mild blood-tinged sputum, persistent leukocytosis and positive procalcitonin, likely a superadded bacterial conversion. -Started on doxycycline  and Ceftin for 5 days -Continue with Decadron -Continue with supportive care

## 2022-12-18 NOTE — Progress Notes (Signed)
Transition of Care [facility]) - Inpatient Brief Assessment   Patient Details  Name: Tim Henry MRN: [id number] Date of Birth: 1928/02/19  [facility] CM/SW Contact:    Janae Bridgeman, RN Phone Number: 12/18/2022, 1:37 PM   Clinical Narrative: CM called and spoke with the patient's daughter by phone and the patient is being discharged home with family today.  The patient's son is picking him up by car for transport home by car.  The patient's daughter was provided with Medicare choice regarding home health services and daughter asked for [facility].  I called Kandee Keen, CM with [facility] and he accepted for home health services for PT/OT.  Home health orders are active in EPIC.  Patient will be discharged home by bedside nursing.   Transition of Care Asessment: Insurance and Status: (P) Insurance coverage has been reviewed Patient has primary care physician: (P) Yes Home environment has been reviewed: (P) Yes - lives at home with daughter, son Prior level of function:: (P) Family assistance - care at home Prior/Current Home Services: (P) No current home services Social Determinants of Health Reivew: (P) SDOH reviewed interventions complete Readmission risk has been reviewed: (P) Yes Transition of care needs: (P) transition of care needs identified, TOC will continue to follow

## 2022-12-18 NOTE — Plan of Care (Signed)
  Problem: Education: Goal: Knowledge of General Education information will improve Description: Including pain rating scale, medication(s)/side effects and non-pharmacologic comfort measures Outcome: Progressing   Problem: Health Behavior/Discharge Planning: Goal: Ability to manage health-related needs will improve Outcome: Progressing   Problem: Clinical Measurements: Goal: Ability to maintain clinical measurements within normal limits will improve Outcome: Progressing Goal: Will remain free from infection Outcome: Progressing Goal: Diagnostic test results will improve Outcome: Progressing Goal: Respiratory complications will improve Outcome: Progressing Goal: Cardiovascular complication will be avoided Outcome: Progressing   Problem: Activity: Goal: Risk for activity intolerance will decrease Outcome: Progressing   Problem: Nutrition: Goal: Adequate nutrition will be maintained Outcome: Progressing   Problem: Coping: Goal: Level of anxiety will decrease Outcome: Progressing   Problem: Elimination: Goal: Will not experience complications related to bowel motility Outcome: Progressing Goal: Will not experience complications related to urinary retention Outcome: Progressing   Problem: Pain Managment: Goal: General experience of comfort will improve Outcome: Progressing   Problem: Safety: Goal: Ability to remain free from injury will improve Outcome: Progressing   Problem: Skin Integrity: Goal: Risk for impaired skin integrity will decrease Outcome: Progressing   Problem: Education: Goal: Knowledge of risk factors and measures for prevention of condition will improve Outcome: Progressing   Problem: Coping: Goal: Psychosocial and spiritual needs will be supported Outcome: Progressing   Problem: Respiratory: Goal: Will maintain a patent airway Outcome: Progressing Goal: Complications related to the disease process, condition or treatment will be avoided or  minimized Outcome: Progressing   Problem: Education: Goal: Ability to describe self-care measures that may prevent or decrease complications (Diabetes Survival Skills Education) will improve Outcome: Progressing Goal: Individualized Educational Video(s) Outcome: Progressing   Problem: Coping: Goal: Ability to adjust to condition or change in health will improve Outcome: Progressing   Problem: Fluid Volume: Goal: Ability to maintain a balanced intake and output will improve Outcome: Progressing   Problem: Health Behavior/Discharge Planning: Goal: Ability to identify and utilize available resources and services will improve Outcome: Progressing Goal: Ability to manage health-related needs will improve Outcome: Progressing   Problem: Metabolic: Goal: Ability to maintain appropriate glucose levels will improve Outcome: Progressing   Problem: Nutritional: Goal: Maintenance of adequate nutrition will improve Outcome: Progressing Goal: Progress toward achieving an optimal weight will improve Outcome: Progressing   Problem: Skin Integrity: Goal: Risk for impaired skin integrity will decrease Outcome: Progressing   Problem: Tissue Perfusion: Goal: Adequacy of tissue perfusion will improve Outcome: Progressing   

## 2022-12-18 NOTE — Progress Notes (Addendum)
Patient's bedside nurse reported that she has noticed some blood mixed with mucus and she is very concerned. At bedside patient is hemodynamically stable.  He denies any chest pain and shortness of breath.  Per chart review patient has been admitted for septic shock in the setting of COVID-19 pneumonia.  Currently treating with Decadron and remdesivir.  He also has elevated D-dimer 4.09 which could be due to inflammatory response in the setting of COVID-19 pneumonia.  Bilateral lower extremity ultrasound to rule out DVT. - However as patient has some bloody sputum production it is obvious later to rule out PE or development of any necrosis in the lung in the setting of COVID-19 pneumonia. - Patient has baseline CKD stage IIIb cannot do CTA chest at this time.  Obtaining nuclear medicine VQ scan.   -Patient is on heparin 5000 twice daily for DVT prophylaxis.  Holding in the setting of development of bloody sputum production.      Media Information   Document Information  Photos  Blood sputum  12/18/2022 05:01  Attached To:  Hospital Encounter on 12/15/22  Source Information  Shanon Rosser, RN  [facility]  Document History    Tereasa Coop, MD [facility] 12/18/2022, 5:17 AM

## 2022-12-19 LAB — QUANTIFERON-TB GOLD PLUS: QuantiFERON-TB Gold Plus: UNDETERMINED — AB

## 2022-12-19 LAB — QUANTIFERON-TB GOLD PLUS (RQFGPL)
QuantiFERON Mitogen Value: 0.18 [IU]/mL
QuantiFERON Nil Value: 0.03 [IU]/mL
QuantiFERON TB1 Ag Value: 0 IU/mL
QuantiFERON TB2 Ag Value: 0 [IU]/mL

## 2022-12-20 LAB — CULTURE, BLOOD (ROUTINE X 2)
Culture: NO GROWTH
Special Requests: ADEQUATE

## 2023-03-27 ENCOUNTER — Other Ambulatory Visit: Payer: Self-pay | Admitting: Urology

## 2023-03-27 DIAGNOSIS — N401 Enlarged prostate with lower urinary tract symptoms: Secondary | ICD-10-CM

## 2023-04-05 ENCOUNTER — Ambulatory Visit: Payer: Medicare PPO | Admitting: Urology

## 2023-04-05 ENCOUNTER — Encounter: Payer: Self-pay | Admitting: Urology

## 2023-04-05 VITALS — BP 143/74 | HR 86 | Ht 73.0 in | Wt 220.0 lb

## 2023-04-05 DIAGNOSIS — N401 Enlarged prostate with lower urinary tract symptoms: Secondary | ICD-10-CM

## 2023-04-05 DIAGNOSIS — R350 Frequency of micturition: Secondary | ICD-10-CM

## 2023-04-05 LAB — BLADDER SCAN AMB NON-IMAGING: Scan Result: 0

## 2023-04-05 MED ORDER — TAMSULOSIN HCL 0.4 MG PO CAPS: 0.4000 mg | ORAL_CAPSULE | Freq: Every day | ORAL | 3 refills | Status: DC

## 2023-04-05 MED ORDER — FUROSEMIDE 20 MG PO TABS: 20.0000 mg | ORAL_TABLET | Freq: Every day | ORAL | 1 refills | Status: AC

## 2023-04-05 NOTE — Progress Notes (Signed)
I, Tim Henry, acting as a scribe for Tim Altes, MD., have documented all relevant documentation on the behalf of Tim Altes, MD, as directed by Tim Altes, MD while in the presence of Tim Altes, MD.  04/05/2023 3:20 PM   Tim Henry [birth year] [id number]  Referring provider: Aniceto Boss, MD [address] [place],  [place] [address]  Chief Complaint  Patient presents with   Benign Prostatic Hypertrophy   Urologic history: 1.  BPH with lower urinary tract symptoms Followed since 2015; initially treated with tamsulosin with significant improvement in his symptoms Finasteride added for worsening LUTS   2.  Microhematuria evaluation September 2017 Renal ultrasound bilateral simple cysts Cystoscopy with trilobar enlargement/prominent hypervascularity  HPI: Tim Henry is a 89 y.o. male presents for annual follow-up. He presents today with his daughter.  No problems since last year's visit and he has no complaints.  Stable lower urinary tract symptoms.  Remains on tamsulosin/finasteride.  Denies dysuria, gross hematuria.  Denies flank, abdominal, or pelvic pain.   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;  Service: Ophthalmology;  Laterality: Left;  DIABETIC PT WOULD LIKE TO HAVE ARRIVAL TIME LATE AM   JOINT REPLACEMENT Left    knee   PHOTOCOAGULATION WITH LASER Right 01/23/2016   Procedure: PHOTOCOAGULATION WITH LASER;  Surgeon: Tim Hess, MD;  Location: [facility];  Service: Ophthalmology;  Laterality: Right;  DIABETIC - oral meds RIGHT Requests arrival 10AM or after    Home Medications:  Allergies as of 04/05/2023   No Known Allergies      Medication List        Accurate as of April 05, 2023  3:20 PM. If you have any questions, ask your nurse or doctor.          acetaminophen 500 MG tablet Commonly known as: TYLENOL Take 500 mg by mouth 2 (two) times daily as needed for fever, headache, moderate pain or mild pain.   ALPRAZolam 0.5 MG tablet Commonly known as: XANAX Take 0.5 mg by mouth 2 (two) times daily.   aspirin EC 81 MG tablet Take 1 tablet (81 mg total) by mouth daily with breakfast. Swallow whole. What changed: additional instructions   benzonatate 100 MG capsule Commonly known as: TESSALON Take 1 capsule (100 mg total) by mouth 3 (three) times daily as needed for cough.   BLACK ELDERBERRY PO Take 1 capsule by mouth at bedtime.   carvedilol 6.25 MG tablet Commonly known as: COREG Take 6.25 mg by mouth 2 (two) times daily  with a meal.   diphenhydrAMINE 25 MG tablet Commonly known as: BENADRYL Take 25 mg by mouth at bedtime.   ferrous sulfate 325 (65 FE) MG tablet Take 325 mg by mouth at bedtime.   finasteride 5 MG tablet Commonly known as: PROSCAR TAKE ONE TABLET BY MOUTH EVERY DAY   furosemide 20 MG tablet Commonly known as: LASIX Take 1 tablet (20 mg total) by mouth daily. What changed: how much to take   gabapentin 300 MG capsule Commonly known as: NEURONTIN Take 300 mg by mouth 2 (two) times daily with a meal. 1300, 2300   GLUCOSAMINE PO Take 2 tablets by mouth at  bedtime.   guaiFENesin 600 MG 12 hr tablet Commonly known as: MUCINEX Take 1 tablet (600 mg total) by mouth 2 (two) times daily as needed for cough or to loosen phlegm.   metFORMIN 500 MG tablet Commonly known as: GLUCOPHAGE Take 1 tablet (500 mg total) by mouth 2 (two) times daily with a meal. 1 PM, 11 PM What changed: additional instructions   multivitamin with minerals tablet Take 1 tablet by mouth at bedtime.   OIL OF OREGANO PO Take 1 capsule by mouth at bedtime.   omeprazole 40 MG capsule Commonly known as: PRILOSEC Take 40 mg by mouth daily.   PHOSPHATIDYL CHOLINE PO Take 2 tablets by mouth at bedtime.   potassium chloride 10 MEQ tablet Commonly known as: KLOR-CON Take 20 mEq by mouth daily.   PROBIOTIC DAILY PO Take 1 capsule by mouth at bedtime.   sertraline 100 MG tablet Commonly known as: ZOLOFT Take 100 mg by mouth daily.   simvastatin 10 MG tablet Commonly known as: ZOCOR Take 10 mg by mouth at bedtime.   sitaGLIPtin 100 MG tablet Commonly known as: JANUVIA Take 100 mg by mouth daily.   SPIRULINA PO Take 1 tablet by mouth at bedtime.   sucralfate 1 g tablet Commonly known as: CARAFATE Take 1 g by mouth 2 (two) times daily.   tamsulosin 0.4 MG Caps capsule Commonly known as: FLOMAX Take 1 capsule (0.4 mg total) by mouth daily after breakfast. 1300   telmisartan 20 MG tablet Commonly known as: MICARDIS Take 20 mg by mouth daily.   verapamil 240 MG CR tablet Commonly known as: CALAN-SR Take 240 mg by mouth daily.   Vitamin D (Ergocalciferol) 1.25 MG (50000 UNIT) Caps capsule Commonly known as: DRISDOL Take 50,000 Units by mouth every Wednesday.        Allergies: No Known Allergies  Family History: Family History  Problem Relation Age of Onset   Heart attack Mother    Diabetes Mother    Heart attack Father    Diabetes Sister     Social History:  reports that he quit smoking about 56 years ago. His smoking use included  cigarettes. He has never used smokeless tobacco. He reports that he does not drink alcohol and does not use drugs.   Physical Exam: BP (!) 143/74   Pulse 86   Ht 6\' 1"  (1.854 m)   Wt 220 lb (99.8 kg)   BMI 29.03 kg/m   Constitutional:  Alert and oriented, No acute distress. HEENT: [place] AT, moist mucus membranes.  Trachea midline, no masses. Cardiovascular: No clubbing, cyanosis, or edema. Respiratory: Normal respiratory effort, no increased work of breathing. GI: Abdomen is soft, nontender, nondistended, no abdominal masses Skin: No rashes, bruises or suspicious lesions. Neurologic: Grossly intact, no focal deficits, moving all 4 extremities. Psychiatric: Normal mood and affect.  Assessment & Plan:    1. BPH with LUTS Stable on tamsulosin/finasteride which were refilled.  PVR today 0 mL.  Continue annual follow-up.  [facility] [address], [address] [place], [place] [address] [phone]

## 2023-05-09 ENCOUNTER — Other Ambulatory Visit: Payer: Self-pay | Admitting: Urology

## 2023-05-09 NOTE — Telephone Encounter (Signed)
 Notified patient as instructed, patient will call PCP>

## 2023-06-11 ENCOUNTER — Ambulatory Visit (INDEPENDENT_AMBULATORY_CARE_PROVIDER_SITE_OTHER): Payer: Medicare PPO | Admitting: Vascular Surgery

## 2023-08-02 ENCOUNTER — Ambulatory Visit (INDEPENDENT_AMBULATORY_CARE_PROVIDER_SITE_OTHER): Payer: Medicare PPO | Admitting: Vascular Surgery

## 2023-09-13 ENCOUNTER — Encounter (INDEPENDENT_AMBULATORY_CARE_PROVIDER_SITE_OTHER): Payer: Self-pay | Admitting: Vascular Surgery

## 2023-09-13 ENCOUNTER — Ambulatory Visit (INDEPENDENT_AMBULATORY_CARE_PROVIDER_SITE_OTHER): Admitting: Vascular Surgery

## 2023-09-13 VITALS — BP 125/71 | HR 73 | Resp 16 | Wt 219.0 lb

## 2023-09-13 DIAGNOSIS — E118 Type 2 diabetes mellitus with unspecified complications: Secondary | ICD-10-CM

## 2023-09-13 DIAGNOSIS — I890 Lymphedema, not elsewhere classified: Secondary | ICD-10-CM | POA: Diagnosis not present

## 2023-09-13 DIAGNOSIS — I509 Heart failure, unspecified: Secondary | ICD-10-CM

## 2023-09-13 DIAGNOSIS — E785 Hyperlipidemia, unspecified: Secondary | ICD-10-CM

## 2023-09-13 DIAGNOSIS — I10 Essential (primary) hypertension: Secondary | ICD-10-CM

## 2023-09-13 NOTE — Assessment & Plan Note (Signed)
 Symptom control is excellent.  He is doing quite well.  Continue current measures which seem to be working well.  He had multiple episodes of previous ulceration and marked swelling previously, but he and his family have done wearing that the last couple of years.  Follow-up in 6 months.

## 2023-09-13 NOTE — Progress Notes (Signed)
 MRN : [id number]  Tim Henry is a 89 y.o. (1927-06-20) male who presents with chief complaint of  Chief Complaint  Patient presents with   Follow-up    6 month follow up  .  History of Present Illness: Patient returns today in follow up of his lymphedema and leg swelling.  His legs are looking quite well.  Swelling is very mild not that bothersome at this point.  He and his family have done an excellent job of him continuing to exercise, elevate his legs, and use moisturizers frequently.  Current Outpatient Medications  Medication Sig Dispense Refill   acetaminophen  (TYLENOL ) 500 MG tablet Take 500 mg by mouth 2 (two) times daily as needed for fever, headache, moderate pain or mild pain.     ALPRAZolam  (XANAX ) 0.5 MG tablet Take 0.5 mg by mouth 2 (two) times daily.     aspirin  EC 81 MG tablet Take 1 tablet (81 mg total) by mouth daily with breakfast. Swallow whole. (Patient taking differently: Take 81 mg by mouth daily with breakfast.) 30 tablet 11   benzonatate  (TESSALON ) 100 MG capsule Take 1 capsule (100 mg total) by mouth 3 (three) times daily as needed for cough. 20 capsule 0   BLACK ELDERBERRY PO Take 1 capsule by mouth at bedtime.     carvedilol  (COREG ) 6.25 MG tablet Take 6.25 mg by mouth 2 (two) times daily with a meal.   10   diphenhydrAMINE (BENADRYL) 25 MG tablet Take 25 mg by mouth at bedtime.     ferrous sulfate  325 (65 FE) MG tablet Take 325 mg by mouth at bedtime.     finasteride  (PROSCAR ) 5 MG tablet TAKE ONE TABLET BY MOUTH EVERY DAY 90 tablet 3   furosemide  (LASIX ) 20 MG tablet Take 1 tablet (20 mg total) by mouth daily. 30 tablet 1   gabapentin  (NEURONTIN ) 300 MG capsule Take 300 mg by mouth 2 (two) times daily with a meal. 1300, 2300     Glucosamine HCl (GLUCOSAMINE PO) Take 2 tablets by mouth at bedtime.     guaiFENesin  (MUCINEX ) 600 MG 12 hr tablet Take 1 tablet (600 mg total) by mouth 2 (two) times daily as needed for cough or to loosen phlegm. 30 tablet 0    metFORMIN  (GLUCOPHAGE ) 500 MG tablet Take 1 tablet (500 mg total) by mouth 2 (two) times daily with a meal. 1 PM, 11 PM (Patient taking differently: Take 500 mg by mouth daily with breakfast. 1300, 2300.) 60 tablet 3   Multiple Vitamins-Minerals (MULTIVITAMIN WITH MINERALS) tablet Take 1 tablet by mouth at bedtime.     OIL OF OREGANO PO Take 1 capsule by mouth at bedtime.     omeprazole (PRILOSEC) 40 MG capsule Take 40 mg by mouth daily.  5   PHOSPHATIDYL CHOLINE PO Take 2 tablets by mouth at bedtime.     potassium chloride  (K-DUR) 10 MEQ tablet Take 20 mEq by mouth daily.     Probiotic Product (PROBIOTIC DAILY PO) Take 1 capsule by mouth at bedtime.     sertraline  (ZOLOFT ) 100 MG tablet Take 100 mg by mouth daily.     simvastatin  (ZOCOR ) 10 MG tablet Take 10 mg by mouth at bedtime.     sitaGLIPtin (JANUVIA) 100 MG tablet Take 100 mg by mouth daily.     SPIRULINA PO Take 1 tablet by mouth at bedtime.     sucralfate (CARAFATE) 1 g tablet Take 1 g by mouth 2 (two) times daily.  tamsulosin  (FLOMAX ) 0.4 MG CAPS capsule Take 1 capsule (0.4 mg total) by mouth daily after breakfast. 1300 90 capsule 3   telmisartan (MICARDIS) 20 MG tablet Take 20 mg by mouth daily.  5   verapamil  (CALAN -SR) 240 MG CR tablet Take 240 mg by mouth daily.     Vitamin D, Ergocalciferol, (DRISDOL) 1.25 MG (50000 UT) CAPS capsule Take 50,000 Units by mouth every Wednesday.  5   No current facility-administered medications for this visit.    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)    none  since release from hosp    Past Surgical History:  Procedure Laterality Date   APPENDECTOMY     childhood   BACK SURGERY  1963   CATARACT EXTRACTION W/PHACO Left 09/15/2014   Procedure: CATARACT EXTRACTION PHACO AND INTRAOCULAR LENS PLACEMENT (IOC);  Surgeon: Annell Kidney, MD;  Location: [facility];  Service: Ophthalmology;  Laterality: Left;  DIABETIC PT WOULD LIKE TO HAVE ARRIVAL TIME LATE AM   JOINT REPLACEMENT Left    knee   PHOTOCOAGULATION WITH LASER Right 01/23/2016   Procedure: PHOTOCOAGULATION WITH LASER;  Surgeon: Billee Buddle, MD;  Location: [facility];  Service: Ophthalmology;  Laterality: Right;  DIABETIC - oral meds RIGHT Requests arrival 10AM or after     Social History   Tobacco Use   Smoking status: Former    Current packs/day: 0.00    Types: Cigarettes    Quit date: 04/30/1966    Years since quitting: 57.4   Smokeless tobacco: Never  Substance Use Topics   Alcohol use: No   Drug use: No      Family History  Problem Relation Age of Onset   Heart attack Mother    Diabetes Mother    Heart attack Father    Diabetes Sister      No Known Allergies   REVIEW OF SYSTEMS (Negative unless checked)   Constitutional: [] Weight loss  [] Fever  [] Chills Cardiac: [] Chest pain   [] Chest pressure   [] Palpitations   [] Shortness of breath when laying flat   [] Shortness of breath at rest   [] Shortness of breath with exertion. Vascular:  [] Pain in legs with walking   [] Pain in legs at rest   [] Pain in legs when laying flat   [] Claudication   [] Pain in feet when walking  [] Pain in feet at rest  [] Pain in feet when laying flat   [] History of DVT   [] Phlebitis   [x] Swelling in legs   [] Varicose veins   [x] Non-healing ulcers Pulmonary:   [] Uses home oxygen   [] Productive cough   [] Hemoptysis   [] Wheeze  [] COPD   [] Asthma Neurologic:  [] Dizziness  [] Blackouts   [] Seizures   [] History of stroke   [] History of TIA  [] Aphasia   [] Temporary  blindness   [] Dysphagia   [] Weakness or numbness in arms   [] Weakness or numbness in legs Musculoskeletal:  [x] Arthritis   [] Joint swelling   []   Joint pain   [] Low back pain Hematologic:  [] Easy bruising  [] Easy bleeding   [] Hypercoagulable state   [] Anemic   Gastrointestinal:  [] Blood in stool   [] Vomiting blood  [] Gastroesophageal reflux/heartburn   [] Abdominal pain Genitourinary:  [x] Chronic kidney disease   [] Difficult urination  [] Frequent urination  [] Burning with urination   [] Hematuria Skin:  [] Rashes   [x] Ulcers   [x] Wounds Psychological:  [] History of anxiety   []  History of major depression.  Physical Examination  BP 125/71   Pulse 73   Resp 16   Wt 219 lb (99.3 kg)   BMI 28.89 kg/m  Gen:  WD/WN, NAD. Appears younger than stated age Head: [place]/AT, No temporalis wasting. Ear/Nose/Throat: Hearing grossly intact, nares w/o erythema or drainage Eyes: Conjunctiva clear. Sclera non-icteric Neck: Supple.  Trachea midline Pulmonary:  Good air movement, no use of accessory muscles.  Cardiac: irregular Vascular:  Vessel Right Left  Radial Palpable Palpable               Musculoskeletal: M/S 5/5 throughout.  No deformity or atrophy.  Marked stasis changes are present bilaterally.  No ulceration.  Quite mild bilateral lower extremity edema. Neurologic: Sensation grossly intact in extremities.  Symmetrical.  Speech is fluent.  Psychiatric: Judgment intact, Mood & affect appropriate for pt's clinical situation. Dermatologic: No rashes or ulcers noted.  No cellulitis or open wounds.      Labs No results found for this or any previous visit (from the past 2160 hours).  Radiology No results found.  Assessment/Plan  Lymphedema Symptom control is excellent.  He is doing quite well.  Continue current measures which seem to be working well.  He had multiple episodes of previous ulceration and marked swelling previously, but he and his family have done wearing that the last couple  of years.  Follow-up in 6 months.  Essential hypertension blood pressure control important in reducing the progression of atherosclerotic disease. On appropriate oral medications.     Type 2 diabetes mellitus with complication (HCC) blood glucose control important in reducing the progression of atherosclerotic disease. Also, involved in wound healing. On appropriate medications.     Hyperlipidemia lipid control important in reducing the progression of atherosclerotic disease. Continue statin therapy   Heart failure (HCC) Can worsen LE swelling  Mikki Alexander, MD  09/13/2023 12:28 PM    This note was created with Dragon medical transcription system.  Any errors from dictation are purely unintentional

## 2023-09-17 ENCOUNTER — Encounter (INDEPENDENT_AMBULATORY_CARE_PROVIDER_SITE_OTHER): Payer: Self-pay

## 2024-01-21 ENCOUNTER — Emergency Department (HOSPITAL_COMMUNITY)

## 2024-01-21 ENCOUNTER — Emergency Department (HOSPITAL_COMMUNITY)
Admission: EM | Admit: 2024-01-21 | Discharge: 2024-01-22 | Disposition: A | Discharge status: Home / Self Care | Attending: Emergency Medicine | Admitting: Emergency Medicine

## 2024-01-21 ENCOUNTER — Encounter (HOSPITAL_COMMUNITY): Payer: Self-pay

## 2024-01-21 ENCOUNTER — Other Ambulatory Visit: Payer: Self-pay

## 2024-01-21 DIAGNOSIS — N281 Cyst of kidney, acquired: Secondary | ICD-10-CM | POA: Insufficient documentation

## 2024-01-21 DIAGNOSIS — K8080 Other cholelithiasis without obstruction: Secondary | ICD-10-CM | POA: Diagnosis not present

## 2024-01-21 DIAGNOSIS — I700 Atherosclerosis of aorta: Secondary | ICD-10-CM | POA: Diagnosis not present

## 2024-01-21 DIAGNOSIS — R55 Syncope and collapse: Secondary | ICD-10-CM

## 2024-01-21 DIAGNOSIS — K5730 Diverticulosis of large intestine without perforation or abscess without bleeding: Secondary | ICD-10-CM | POA: Insufficient documentation

## 2024-01-21 DIAGNOSIS — I130 Hypertensive heart and chronic kidney disease with heart failure and stage 1 through stage 4 chronic kidney disease, or unspecified chronic kidney disease: Secondary | ICD-10-CM | POA: Diagnosis not present

## 2024-01-21 DIAGNOSIS — R944 Abnormal results of kidney function studies: Secondary | ICD-10-CM | POA: Insufficient documentation

## 2024-01-21 DIAGNOSIS — Z7982 Long term (current) use of aspirin: Secondary | ICD-10-CM | POA: Insufficient documentation

## 2024-01-21 DIAGNOSIS — I509 Heart failure, unspecified: Secondary | ICD-10-CM | POA: Diagnosis not present

## 2024-01-21 DIAGNOSIS — K5790 Diverticulosis of intestine, part unspecified, without perforation or abscess without bleeding: Secondary | ICD-10-CM

## 2024-01-21 DIAGNOSIS — F0390 Unspecified dementia without behavioral disturbance: Secondary | ICD-10-CM | POA: Diagnosis not present

## 2024-01-21 DIAGNOSIS — E1122 Type 2 diabetes mellitus with diabetic chronic kidney disease: Secondary | ICD-10-CM | POA: Diagnosis not present

## 2024-01-21 DIAGNOSIS — I7121 Aneurysm of the ascending aorta, without rupture: Secondary | ICD-10-CM | POA: Insufficient documentation

## 2024-01-21 DIAGNOSIS — R112 Nausea with vomiting, unspecified: Secondary | ICD-10-CM

## 2024-01-21 DIAGNOSIS — N189 Chronic kidney disease, unspecified: Secondary | ICD-10-CM | POA: Insufficient documentation

## 2024-01-21 DIAGNOSIS — R791 Abnormal coagulation profile: Secondary | ICD-10-CM | POA: Insufficient documentation

## 2024-01-21 DIAGNOSIS — R1030 Lower abdominal pain, unspecified: Secondary | ICD-10-CM | POA: Diagnosis present

## 2024-01-21 DIAGNOSIS — R001 Bradycardia, unspecified: Secondary | ICD-10-CM | POA: Diagnosis not present

## 2024-01-21 LAB — URINALYSIS, W/ REFLEX TO CULTURE (INFECTION SUSPECTED)
Bacteria, UA: NONE SEEN
Bilirubin Urine: NEGATIVE
Glucose, UA: NEGATIVE mg/dL
Hgb urine dipstick: NEGATIVE
Ketones, ur: NEGATIVE mg/dL
Leukocytes,Ua: NEGATIVE
Nitrite: NEGATIVE
Protein, ur: NEGATIVE mg/dL
Specific Gravity, Urine: 1.016 (ref 1.005–1.030)
pH: 5 (ref 5.0–8.0)

## 2024-01-21 LAB — COMPREHENSIVE METABOLIC PANEL WITH GFR
ALT: 15 U/L (ref 0–44)
AST: 21 U/L (ref 15–41)
Albumin: 3.6 g/dL (ref 3.5–5.0)
Alkaline Phosphatase: 62 U/L (ref 38–126)
Anion gap: 13 (ref 5–15)
BUN: 29 mg/dL — ABNORMAL HIGH (ref 8–23)
CO2: 24 mmol/L (ref 22–32)
Calcium: 8.9 mg/dL (ref 8.9–10.3)
Chloride: 105 mmol/L (ref 98–111)
Creatinine, Ser: 1.57 mg/dL — ABNORMAL HIGH (ref 0.61–1.24)
GFR, Estimated: 41 mL/min — ABNORMAL LOW (ref >60)
Glucose, Bld: 160 mg/dL — ABNORMAL HIGH (ref 70–99)
Potassium: 4 mmol/L (ref 3.5–5.1)
Sodium: 142 mmol/L (ref 135–145)
Total Bilirubin: 0.7 mg/dL (ref 0.0–1.2)
Total Protein: 6.6 g/dL (ref 6.5–8.1)

## 2024-01-21 LAB — LIPASE, BLOOD: Lipase: 51 U/L (ref 11–51)

## 2024-01-21 LAB — CBC WITH DIFFERENTIAL/PLATELET
Abs Immature Granulocytes: 0.05 K/uL (ref 0.00–0.07)
Basophils Absolute: 0 K/uL (ref 0.0–0.1)
Basophils Relative: 0 %
Eosinophils Absolute: 0.1 K/uL (ref 0.0–0.5)
Eosinophils Relative: 1 %
HCT: 37.5 % — ABNORMAL LOW (ref 39.0–52.0)
Hemoglobin: 11.7 g/dL — ABNORMAL LOW (ref 13.0–17.0)
Immature Granulocytes: 1 %
Lymphocytes Relative: 12 %
Lymphs Abs: 1.3 K/uL (ref 0.7–4.0)
MCH: 32.9 pg (ref 26.0–34.0)
MCHC: 31.2 g/dL (ref 30.0–36.0)
MCV: 105.3 fL — ABNORMAL HIGH (ref 80.0–100.0)
Monocytes Absolute: 0.8 K/uL (ref 0.1–1.0)
Monocytes Relative: 8 %
Neutro Abs: 8.2 K/uL — ABNORMAL HIGH (ref 1.7–7.7)
Neutrophils Relative %: 78 %
Platelets: 168 K/uL (ref 150–400)
RBC: 3.56 MIL/uL — ABNORMAL LOW (ref 4.22–5.81)
RDW: 12.6 % (ref 11.5–15.5)
WBC: 10.5 K/uL (ref 4.0–10.5)
nRBC: 0 % (ref 0.0–0.2)

## 2024-01-21 LAB — RESP PANEL BY RT-PCR (RSV, FLU A&B, COVID)  RVPGX2
Influenza A by PCR: NEGATIVE
Influenza B by PCR: NEGATIVE
Resp Syncytial Virus by PCR: NEGATIVE
SARS Coronavirus 2 by RT PCR: NEGATIVE

## 2024-01-21 LAB — TROPONIN I (HIGH SENSITIVITY)
Troponin I (High Sensitivity): 3 ng/L (ref <18)
Troponin I (High Sensitivity): 2 ng/L (ref <18)

## 2024-01-21 LAB — DIC (DISSEMINATED INTRAVASCULAR COAGULATION)PANEL
D-Dimer, Quant: 2.59 ug{FEU}/mL — ABNORMAL HIGH (ref 0.00–0.50)
Fibrinogen: 397 mg/dL (ref 210–475)
INR: 1 (ref 0.8–1.2)
Platelets: 159 K/uL (ref 150–400)
Prothrombin Time: 14.1 s (ref 11.4–15.2)
aPTT: 26 s (ref 24–36)

## 2024-01-21 LAB — CBG MONITORING, ED: Glucose-Capillary: 229 mg/dL — ABNORMAL HIGH (ref 70–99)

## 2024-01-21 LAB — LACTIC ACID, PLASMA
Lactic Acid, Venous: 1.4 mmol/L (ref 0.5–1.9)
Lactic Acid, Venous: 1.2 mmol/L (ref 0.5–1.9)

## 2024-01-21 LAB — MAGNESIUM: Magnesium: 1.9 mg/dL (ref 1.7–2.4)

## 2024-01-21 MED ORDER — IOHEXOL 350 MG/ML SOLN
75.0000 mL | Freq: Once | INTRAVENOUS | Status: AC | PRN
  Administered 2024-01-21: 75 mL via INTRAVENOUS

## 2024-01-21 MED ORDER — LACTATED RINGERS IV BOLUS
500.0000 mL | Freq: Once | INTRAVENOUS | Status: AC
  Administered 2024-01-21: 500 mL via INTRAVENOUS

## 2024-01-21 MED ORDER — ONDANSETRON HCL 4 MG/2ML IJ SOLN: 4.0000 mg | Freq: Four times a day (QID) | INTRAMUSCULAR | Status: DC | PRN

## 2024-01-21 NOTE — ED Provider Notes (Signed)
 Provider Note MRN:  [id number]  Arrival date & time: 01/22/24    ED Course and Medical Decision Making  Assumed care from Dr Melvenia at shift change.  See note from prior team for complete details, in brief:  Clinical Course as of 01/22/24 0749  Tue Jan 21, 2024  [address] 89 yo/m Here w/ emesis, hypotensive on arrival, bradycardic > improving w/ fluids Labs stable, similar to prior   [SG]  1908 D-dimer noted to be elevated, he was noted to be hypotensive but is not complaining of any acute dyspnea,CP.  Will get ctpe given complaint syncope [SG]  2328 Creatinine(!): 1.57 Similar to prior [SG]  2328 Hemoglobin(!): 11.7 Similar to prior [SG]  2332 Echocardiogram 8/24 with LVEF 60-65%, G1 DD [SG]  2344 Eleni List Daughter Emergency Contact [phone]  [SG]    Clinical Course User Index [SG] Elnor Savant A, DO   Pt with syncopal symptoms with prodrome, he was hypotensive / bradycardia noted on arrival.   Pt had extensive workup, workup was stable. He is feeling much better, tolerating PO, symptoms have not re-occurred. Bradycardia has resolved, likely once coreg  washed out of his system. Attribute symptoms today likely to coreg  and mild dehydration. Discussed treatment options w/ pt and family, pt would prefer to go home. Will have him hold his coreg , family/daughter will coordinate follow up appointment and will help him stay hydrated. He actually had appt with primary team for tomorrow but they called to reschedule since he was in the ED today. They will call back in the AM to see about getting the appointment back  Discussed incidental findings w/ pt, advised o/p f/u  Patient in no distress and overall condition is stable. Detailed discussions were had with the patient/guardian regarding current findings, and need for close f/u with PCP or on call doctor. The patient/guardian has been instructed to return immediately if the symptoms worsen in any way for re-evaluation.  Patient/guardian verbalized understanding and is in agreement with current care plan. All questions answered prior to discharge.     Procedures  Final Clinical Impressions(s) / ED Diagnoses     ICD-10-CM   1. Nausea and vomiting, unspecified vomiting type  R11.2     2. Bradycardia  R00.1     3. Aortic atherosclerosis  I70.0     4. Aneurysm of ascending aorta without rupture  I71.21     5. Diverticulosis  K57.90     6. Biliary calculus of other site without obstruction  K80.80     7. Renal cyst  N28.1     8. Syncope, unspecified syncope type  R55       ED Discharge Orders     None         Discharge Instructions      It was a pleasure caring for you today in the emergency department.  Please stop taking your carvedilol /coreg  as your heart rate has become low with this medication   Drink plenty of fluids, get plenty of rest over the next few days  There was a cyst noted on your kidney,  please follow up with pcp for ultrasound for further evaluation   Have someone stay with you until you feel stable. Do not drive, operate machinery, or play sports until your caregiver says it is okay. Keep all follow-up appointments as directed by your caregiver. Lie down right away if you start feeling like you might faint. Breathe deeply and steadily. Wait until all the symptoms have  passed.Drink enough fluids to keep your urine clear or pale yellow. If you are taking blood pressure or heart medicine, get up slowly, taking several minutes to sit and then stand. This can reduce dizziness. SEEK IMMEDIATE MEDICAL CARE IF: You have a severe headache. You have unusual pain in the chest, abdomen, or back. You are bleeding from the mouth or rectum, or you have a black or tarry stool. You have an irregular or very fast heartbeat. You have pain with breathing. You have repeated fainting or seizure-like jerking during an episode. You faint when sitting or lying down. You have confusion. You have  difficulty walking. You have severe weakness. You have vision problems. If you fainted, call your local emergency services - do not drive yourself to the hospital.   Please return to the emergency department immediately for any new or concerning symptoms, or if you get worse.            Elnor [name] LABOR, DO 01/22/24 [phone]

## 2024-01-21 NOTE — ED Triage Notes (Signed)
 Pt BIB [name] for hypotension and hyperglycemia. Pt BP 96/46 and CBG 256. Per EMS pt vomited one time before calling EMS because he was feeling sick to his stomach. Pt states stomach feeling quizzy.

## 2024-01-21 NOTE — Discharge Instructions (Addendum)
 It was a pleasure caring for you today in the emergency department.  Please stop taking your carvedilol /coreg  as your heart rate has become low with this medication   Drink plenty of fluids, get plenty of rest over the next few days  There was a cyst noted on your kidney,  please follow up with pcp for ultrasound for further evaluation   Have someone stay with you until you feel stable. Do not drive, operate machinery, or play sports until your caregiver says it is okay. Keep all follow-up appointments as directed by your caregiver. Lie down right away if you start feeling like you might faint. Breathe deeply and steadily. Wait until all the symptoms have passed.Drink enough fluids to keep your urine clear or pale yellow. If you are taking blood pressure or heart medicine, get up slowly, taking several minutes to sit and then stand. This can reduce dizziness. SEEK IMMEDIATE MEDICAL CARE IF: You have a severe headache. You have unusual pain in the chest, abdomen, or back. You are bleeding from the mouth or rectum, or you have a black or tarry stool. You have an irregular or very fast heartbeat. You have pain with breathing. You have repeated fainting or seizure-like jerking during an episode. You faint when sitting or lying down. You have confusion. You have difficulty walking. You have severe weakness. You have vision problems. If you fainted, call your local emergency services - do not drive yourself to the hospital.   Please return to the emergency department immediately for any new or concerning symptoms, or if you get worse.

## 2024-01-21 NOTE — ED Notes (Signed)
 PC from pt's daughter, Eleni, to check on pt. Explained he is still being evaluated.
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
## 2024-01-22 NOTE — ED Notes (Signed)
 Called [facility] for transport. Tim Henry

## 2024-01-22 NOTE — ED Notes (Addendum)
 Tim Henry

## 2024-01-26 LAB — CULTURE, BLOOD (ROUTINE X 2)
Culture: NO GROWTH
Culture: NO GROWTH
Special Requests: ADEQUATE

## 2024-02-20 ENCOUNTER — Other Ambulatory Visit: Payer: Self-pay | Admitting: Urology

## 2024-02-20 DIAGNOSIS — N401 Enlarged prostate with lower urinary tract symptoms: Secondary | ICD-10-CM

## 2024-02-26 ENCOUNTER — Other Ambulatory Visit: Payer: Self-pay | Admitting: Urology

## 2024-02-26 DIAGNOSIS — N401 Enlarged prostate with lower urinary tract symptoms: Secondary | ICD-10-CM

## 2024-03-05 ENCOUNTER — Ambulatory Visit: Admitting: Podiatry

## 2024-03-05 ENCOUNTER — Encounter: Payer: Self-pay | Admitting: Podiatry

## 2024-03-05 DIAGNOSIS — M79675 Pain in left toe(s): Secondary | ICD-10-CM

## 2024-03-05 DIAGNOSIS — Z0189 Encounter for other specified special examinations: Secondary | ICD-10-CM

## 2024-03-05 DIAGNOSIS — E119 Type 2 diabetes mellitus without complications: Secondary | ICD-10-CM

## 2024-03-05 DIAGNOSIS — M79674 Pain in right toe(s): Secondary | ICD-10-CM | POA: Diagnosis not present

## 2024-03-05 DIAGNOSIS — L309 Dermatitis, unspecified: Secondary | ICD-10-CM | POA: Insufficient documentation

## 2024-03-05 DIAGNOSIS — E1142 Type 2 diabetes mellitus with diabetic polyneuropathy: Secondary | ICD-10-CM | POA: Diagnosis not present

## 2024-03-05 DIAGNOSIS — H5702 Anisocoria: Secondary | ICD-10-CM | POA: Insufficient documentation

## 2024-03-05 DIAGNOSIS — S81801A Unspecified open wound, right lower leg, initial encounter: Secondary | ICD-10-CM | POA: Insufficient documentation

## 2024-03-05 DIAGNOSIS — Z87891 Personal history of nicotine dependence: Secondary | ICD-10-CM | POA: Insufficient documentation

## 2024-03-05 DIAGNOSIS — Z8616 Personal history of COVID-19: Secondary | ICD-10-CM | POA: Insufficient documentation

## 2024-03-05 DIAGNOSIS — M79672 Pain in left foot: Secondary | ICD-10-CM

## 2024-03-05 DIAGNOSIS — Z872 Personal history of diseases of the skin and subcutaneous tissue: Secondary | ICD-10-CM | POA: Diagnosis not present

## 2024-03-05 DIAGNOSIS — Z9181 History of falling: Secondary | ICD-10-CM | POA: Insufficient documentation

## 2024-03-05 DIAGNOSIS — I890 Lymphedema, not elsewhere classified: Secondary | ICD-10-CM | POA: Diagnosis not present

## 2024-03-05 DIAGNOSIS — M79671 Pain in right foot: Secondary | ICD-10-CM

## 2024-03-05 DIAGNOSIS — B351 Tinea unguium: Secondary | ICD-10-CM | POA: Diagnosis not present

## 2024-03-05 DIAGNOSIS — K5900 Constipation, unspecified: Secondary | ICD-10-CM | POA: Insufficient documentation

## 2024-03-05 NOTE — Patient Instructions (Signed)
 Apply Neosporin Cream to both great toes once daily for one week. Call office if you have any redness, drainage or swelling.  Heel Protectors $25.00 each You may purchase them on [employer].   Place your legs on one or two pillows which will allow your heels to avoid touching the bed.  Ingrown Toenail  An ingrown toenail occurs when the corner or sides of a toenail grow into the surrounding skin. This causes discomfort and pain. The big toe is most commonly affected, but any of the toes can be affected. If an ingrown toenail is not treated, it can become infected. What are the causes? This condition may be caused by: Wearing shoes that are too small or tight. An injury, such as stubbing your toe or having your toe stepped on. Improper cutting or care of your toenails. Having nail or foot abnormalities that were present from birth (congenital abnormalities), such as having a nail that is too big for your toe. What increases the risk? The following factors may make you more likely to develop ingrown toenails: Age. Nails tend to get thicker with age, so ingrown nails are more common among older people. Cutting your toenails incorrectly, such as cutting them very short or cutting them unevenly. An ingrown toenail is more likely to get infected if you have: Diabetes. Blood flow (circulation) problems. What are the signs or symptoms? Symptoms of an ingrown toenail may include: Pain, soreness, or tenderness. Redness. Swelling. Hardening of the skin that surrounds the toenail. Signs that an ingrown toenail may be infected include: Fluid or pus. Symptoms that get worse. How is this diagnosed? Ingrown toenails may be diagnosed based on: Your symptoms and medical history. A physical exam. Labs or tests. If you have fluid or blood coming from your toenail, a sample may be collected to test for the specific type of bacteria that is causing the infection. How is this treated? Treatment depends on  the severity of your symptoms. You may be able to care for your toenail at home. If you have an infection, you may be prescribed antibiotic medicines. If you have fluid or pus draining from your toenail, your health care provider may drain it. If you have trouble walking, you may be given crutches to use. If you have a severe or infected ingrown toenail, you may need a procedure to remove part or all of the nail. Follow these instructions at home: Foot care  Check your wound every day for signs of infection, or as often as told by your health care provider. Check for: More redness, swelling, or pain. More fluid or blood. Warmth. Pus or a bad smell. Do not pick at your toenail or try to remove it yourself. Soak your foot in warm, soapy water. Do this for 20 minutes, 3 times a day, or as often as told by your health care provider. This helps to keep your toe clean and your skin soft. Wear shoes that fit well and are not too tight. Your health care provider may recommend that you wear open-toed shoes while you heal. Trim your toenails regularly and carefully. Cut your toenails straight across to prevent injury to the skin at the corners of the toenail. Do not cut your nails in a curved shape. Keep your feet clean and dry to help prevent infection. General instructions Take over-the-counter and prescription medicines only as told by your health care provider. If you were prescribed an antibiotic, take it as told by your health care provider. Do  not stop taking the antibiotic even if you start to feel better. If your health care provider told you to use crutches to help you move around, use them as instructed. Return to your normal activities as told by your health care provider. Ask your health care provider what activities are safe for you. Keep all follow-up visits. This is important. Contact a health care provider if: You have more redness, swelling, pain, or other symptoms that do not  improve with treatment. You have fluid, blood, or pus coming from your toenail. You have a red streak on your skin that starts at your foot and spreads up your leg. You have a fever. Summary An ingrown toenail occurs when the corner or sides of a toenail grow into the surrounding skin. This causes discomfort and pain. The big toe is most commonly affected, but any of the toes can be affected. If an ingrown toenail is not treated, it can become infected. Fluid or pus draining from your toenail is a sign of infection. Your health care provider may need to drain it. You may be given antibiotics to treat the infection. Trimming your toenails regularly and properly can help you prevent an ingrown toenail. This information is not intended to replace advice given to you by your health care provider. Make sure you discuss any questions you have with your health care provider. Document Revised: 08/16/2020 Document Reviewed: 08/16/2020 [employer] Patient Education  2024 [employer]  Pressure Injury  A pressure injury, also called a pressure ulcer or bedsore, is an injury to skin and the tissue under the skin that is caused by pressure. It often affects people who must spend a long time in a bed or chair because of a medical condition. Pressure injuries often occur: Over bony parts of the body, such as the tailbone, shoulders, elbows, hips, heels, spine, ankles, and back of the head. Under medical devices that touch the body. These include stockings, equipment to help with breathing, tubes, and splints. Inside the mouth or nose from dentures or tubes. Pressure injuries start as red areas on the skin and can lead to pain and an open wound. What are the causes? This condition is caused by frequent or constant pressure to an area of the body. Less blood flow to the skin can make the tissue die and break down over time, causing a wound. What increases the risk? You are more likely to develop this condition  if: You are in the hospital or an extended care facility. You are bedridden or in a wheelchair. You have an injury or disease that keeps you from moving well and feeling pain or pressure. You have a condition that: Makes you sleepy or less alert. Causes poor blood flow. You need to wear a medical device. You have poor control of your bladder or bowel movements (incontinence). You are not getting enough fluid or nutrients (malnutrition). Your health care provider may recommend certain types of mattresses, mattress covers, pillows, cushions, or boots to help prevent a pressure injury. These may include products filled with air, foam, gel, or sand. What are the signs or symptoms? Symptoms of this condition depend on how severe your injury is. Symptoms may include: Red or dark areas of the skin. Pain or a change in skin texture. Your skin may feel warmer, cooler, softer, or firmer. Blisters. An open wound. How is this diagnosed? This condition is diagnosed based on a medical history and physical exam. You may also have tests, such as:  Blood tests. Imaging tests. Blood flow tests. Your injury will be staged based on how severe it is. Staging is based on: How deep the tissue injury is. This includes whether muscle, bone, tendon, or dead tissue is exposed. The cause of the injury. How is this treated? This condition may be treated by: Reducing pressure on your skin. You may need to: Change your position often. Avoid positions that caused the wound or that may make the wound worse. Use certain mattresses, overlays, chair cushions, or protective boots. Move medical devices from an area of pressure, or place padding between the skin and the device. Use foams, creams, or powders to protect your skin from sweat, urine, and stool and reduce rubbing (friction) on the skin. Keeping your skin clean and dry. This may include using a skin cleanser or barrier as told by your health care  provider. Cleaning your injury and getting rid of any dead tissue from the wound (debridement). Placing a protective medicine, such as a cream, or bandage (dressing) over your injury. Using medicines for pain or to prevent or treat infection. Surgery may be needed if other treatments are not working or if your injury is very deep. Follow these instructions at home: Medicines Take over-the-counter and prescription medicines only as told by your health care provider. If you were prescribed antibiotics, take or apply them as told by your health care provider. Do not stop using the antibiotic even if you start to feel better. Eating and drinking Drink enough fluid to keep your urine pale yellow. Eat a healthy diet with lots of protein, as told by your health care provider. Do not use drugs or drink alcohol. Wound care Follow instructions from your health care provider about how to take care of your wound. Make sure you: Wash your hands with soap and water before and after you change your dressing or apply medicine to your skin. If soap and water are not available, use hand sanitizer. Change your dressing as told by your health care provider. Check your wound every day for signs of infection. Have a caregiver do this for you if you are not able. Check for: Redness, swelling, or more pain. More fluid or blood. Warmth. Pus or a bad smell. Skin care Keep your skin clean and dry. Gently pat your skin dry. Do not rub or massage your skin. Check your skin every day for any changes in color or any new blisters or sores (ulcers). Reducing pressure Do not lie or sit in one position for a long time. Move or change position every 1-2 hours, or as told by your health care provider. Use pillows or cushions to reduce pressure. Ask your health care provider what cushions or pads you should use. General instructions Do not use any products that contain nicotine or tobacco. These products include cigarettes,  chewing tobacco, and vaping devices, such as e-cigarettes. If you need help quitting, ask your health care provider. Try to be active every day. Ask your health care provider what exercises or activities are safe for you. Keep all follow-up visits. Your health care provider will check if your injury is healing. Contact a health care provider if: You have a fever or chills. You have pain that does not get better with medicine. Your skin changes color. You have new blisters or sores. You have signs of infection. Your wound does not get better after 1-2 weeks of treatment. This information is not intended to replace advice given to you by your  health care provider. Make sure you discuss any questions you have with your health care provider. Document Revised: 10/10/2021 Document Reviewed: 09/15/2021 Else[employer] Patient Education  2024 [employer]  Diabetes Mellitus and Foot Care Diabetes, also called diabetes mellitus, may cause problems with your feet and legs because of poor blood flow (circulation). Poor circulation may make your skin: Become thinner and drier. Break more easily. Heal more slowly. Peel and crack. You may also have nerve damage (neuropathy). This can cause decreased feeling in your legs and feet. This means that you may not notice minor injuries to your feet that could lead to more serious problems. Finding and treating problems early is the best way to prevent future foot problems. How to care for your feet Foot hygiene  Wash your feet daily with warm water and mild soap. Do not use hot water. Then, pat your feet and the areas between your toes until they are fully dry. Do not soak your feet. This can dry your skin. Trim your toenails straight across. Do not dig under them or around the cuticle. File the edges of your nails with an emery board or nail file. Apply a moisturizing lotion or petroleum jelly to the skin on your feet and to dry, brittle toenails. Use lotion that  does not contain alcohol and is unscented. Do not apply lotion between your toes. Shoes and socks Wear clean socks or stockings every day. Make sure they are not too tight. Do not wear knee-high stockings. These may decrease blood flow to your legs. Wear shoes that fit well and have enough cushioning. Always look in your shoes before you put them on to be sure there are no objects inside. To break in new shoes, wear them for just a few hours a day. This prevents injuries on your feet. Wounds, scrapes, corns, and calluses  Check your feet daily for blisters, cuts, bruises, sores, and redness. If you cannot see the bottom of your feet, use a mirror or ask someone for help. Do not cut off corns or calluses or try to remove them with medicine. If you find a minor scrape, cut, or break in the skin on your feet, keep it and the skin around it clean and dry. You may clean these areas with mild soap and water. Do not clean the area with peroxide, alcohol, or iodine . If you have a wound, scrape, corn, or callus on your foot, look at it several times a day to make sure it is healing and not infected. Check for: Redness, swelling, or pain. Fluid or blood. Warmth. Pus or a bad smell. General tips Do not cross your legs. This may decrease blood flow to your feet. Do not use heating pads or hot water bottles on your feet. They may burn your skin. If you have lost feeling in your feet or legs, you may not know this is happening until it is too late. Protect your feet from hot and cold by wearing shoes, such as at the beach or on hot pavement. Schedule a complete foot exam at least once a year or more often if you have foot problems. Report any cuts, sores, or bruises to your health care provider right away. Where to find more information American Diabetes Association: diabetes.org Association [employer] Education Specialists: diabeteseducator.org Contact a health care provider if: You have a  condition that increases your risk of infection, and you have any cuts, sores, or bruises on your feet. You have an  injury that is not healing. You have redness on your legs or feet. You feel burning or tingling in your legs or feet. You have pain or cramps in your legs and feet. Your legs or feet are numb. Your feet always feel cold. You have pain around any toenails. Get help right away if: You have a wound, scrape, corn, or callus on your foot and: You have signs of infection. You have a fever. You have a red line going up your leg. This information is not intended to replace advice given to you by your health care provider. Make sure you discuss any questions you have with your health care provider. Document Revised: 10/18/2021 Document Reviewed: 10/18/2021 Elsevier Patient Education  2024 [employer].

## 2024-03-15 NOTE — Progress Notes (Signed)
 Subjective: Tim Henry presents today for for diabetic foot evaluation, at risk foot care with history of diabetic neuropathy, and painful mycotic toenails of both feet that are difficult to trim. Pain interferes with daily activities and wearing enclosed shoe gear comfortably. He is accompanied by his son on today's visit. Chief Complaint  Patient presents with   Nail Problem    New Patient - thick, painful toenails   Diabetes    Diabetes with Neuropathy - A1C 6.3   Patient relates painful heels b/l. He does sleep on his back most of the night.  Patient has been diagnosed with neuropathy. He has h/o lower extremity ulcerations.  PCP is Tim Deward BROCKS, MD.  Past Medical History:  Diagnosis Date   Anemia    in past   Anxiety    Dementia (HCC)    able to sign own papers   Depression    Diabetes mellitus without complication (HCC)    Full dentures    upper and lower   GERD (gastroesophageal reflux disease)    Hypercholesteremia    Hypertension    SDH (subdural hematoma) (HCC) 2015   S/p fall, admitted to [facility] and underwent crani, pint of blood removed from head, no residual deficits   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)    none since release from hosp    Patient Active Problem List   Diagnosis Date Noted   Anisocoria 03/05/2024   Constipated 03/05/2024   Former smoker 03/05/2024   History of COVID-19 03/05/2024   History of recent fall 03/05/2024   Perianal dermatitis 03/05/2024   Wound of right leg 03/05/2024   CKD stage G3b/A1, GFR 30-44 and albumin  creatinine ratio <30 mg/g (HCC) 12/17/2022   Hypotension 12/15/2022   COVID-19 virus infection 05/19/2022   Septic shock (HCC) 05/19/2022   UTI (urinary tract infection) 03/04/2022   AKI (acute kidney injury) 03/04/2022   Type 2 diabetes mellitus with hyperglycemia (HCC) 03/04/2022   Dehydration 03/04/2022   Hypoalbuminemia due to protein-calorie malnutrition 03/04/2022   Chronic venous stasis 03/04/2022    Obesity (BMI 30-39.9) 03/04/2022   Generalized weakness 03/03/2022   Left hydrocele 04/13/2020   Anxiety 12/18/2019   Arthritis of knee 12/18/2019   At maximum risk for fall 12/18/2019   Depression 12/18/2019   Dry cough 12/18/2019   Edema 12/18/2019   GERD (gastroesophageal reflux disease) 12/18/2019   Heart failure (HCC) 12/18/2019   Medicare annual wellness visit, subsequent 12/18/2019   Pleural effusion 12/18/2019   Hypertension 12/18/2019   Neck pain 12/18/2019   History of BPH 12/18/2019   Subdural hematoma (HCC) 12/18/2019   Diabetes (HCC) 12/18/2019   Varicose veins of bilateral lower extremities with other complications 12/18/2019   Edema of lower extremity 09/24/2019   Ankle ulcer (HCC) 12/22/2018   Varicose veins of lower extremities with ulcer (HCC) 05/30/2018   Numbness 05/13/2018   Diabetic peripheral neuropathy (HCC) 04/15/2018   Hallux valgus 04/15/2018   Onychomycosis 04/15/2018   Tibialis posterior tendinitis 04/15/2018   Ulcers of both lower extremities, limited to breakdown of skin (HCC) 12/15/2017   Lymphedema 04/10/2017   Bilateral lower extremity edema 03/01/2017   Lower extremity pain, bilateral 03/01/2017   Cellulitis of lower extremity 03/01/2017   Essential hypertension 03/01/2017   Hyperlipidemia 03/01/2017   Type 2 diabetes mellitus with complication (HCC) 03/01/2017   Headache disorder 02/13/2017   Loss of memory 02/13/2017   Unstable gait 02/13/2017   Borderline diabetes 02/07/2017   Microhematuria 11/19/2015   Hx  of hydrocele 01/25/2014   Impaired glucose tolerance 01/25/2014   Indigestion 01/25/2014   Arthrosis of hip 09/11/2013   Pain of left femur 07/09/2013   SDH (subdural hematoma) (HCC) 07/07/2013   Seizures (HCC) 07/07/2013   ED (erectile dysfunction) of organic origin 03/24/2012   BPH (benign prostatic hyperplasia) 03/24/2012    Past Surgical History:  Procedure Laterality Date   APPENDECTOMY     childhood   BACK SURGERY   1963   CATARACT EXTRACTION W/PHACO Left 09/15/2014   Procedure: CATARACT EXTRACTION PHACO AND INTRAOCULAR LENS PLACEMENT (IOC);  Surgeon: Dene Etienne, MD;  Location: [facility];  Service: Ophthalmology;  Laterality: Left;  DIABETIC PT WOULD LIKE TO HAVE ARRIVAL TIME LATE AM   JOINT REPLACEMENT Left    knee   PHOTOCOAGULATION WITH LASER Right 01/23/2016   Procedure: PHOTOCOAGULATION WITH LASER;  Surgeon: Donzell Arlyce Budd, MD;  Location: [facility];  Service: Ophthalmology;  Laterality: Right;  DIABETIC - oral meds RIGHT Requests arrival 10AM or after    Current Outpatient Medications on File Prior to Visit  Medication Sig Dispense Refill   acetaminophen  (TYLENOL ) 500 MG tablet Take 500 mg by mouth 2 (two) times daily as needed for fever, headache, moderate pain or mild pain.     ALPRAZolam  (XANAX ) 0.5 MG tablet Take 0.5 mg by mouth 2 (two) times daily.     aspirin  EC 81 MG tablet Take 1 tablet (81 mg total) by mouth daily with breakfast. Swallow whole. (Patient taking differently: Take 81 mg by mouth daily with breakfast.) 30 tablet 11   benzonatate  (TESSALON ) 100 MG capsule Take 1 capsule (100 mg total) by mouth 3 (three) times daily as needed for cough. 20 capsule 0   BLACK ELDERBERRY PO Take 1 capsule by mouth at bedtime.     diphenhydrAMINE (BENADRYL) 25 MG tablet Take 25 mg by mouth at bedtime.     ferrous sulfate  325 (65 FE) MG tablet Take 325 mg by mouth at bedtime.     finasteride  (PROSCAR ) 5 MG tablet TAKE ONE TABLET BY MOUTH EVERY DAY 90 tablet 3   furosemide  (LASIX ) 20 MG tablet Take 1 tablet (20 mg total) by mouth daily. 30 tablet 1   gabapentin  (NEURONTIN ) 300 MG capsule Take 300 mg by mouth 2 (two) times daily with a meal. 1300, 2300     Glucosamine HCl (GLUCOSAMINE PO) Take 2 tablets by mouth at bedtime.     guaiFENesin  (MUCINEX ) 600 MG 12 hr tablet Take 1 tablet (600 mg total) by mouth 2 (two) times daily as needed for cough or to loosen phlegm.  30 tablet 0   latanoprost  (XALATAN ) 0.005 % ophthalmic solution Place 1 drop into both eyes at bedtime.     metFORMIN  (GLUCOPHAGE ) 500 MG tablet Take 1 tablet (500 mg total) by mouth 2 (two) times daily with a meal. 1 PM, 11 PM (Patient taking differently: Take 500 mg by mouth daily with breakfast. 1300, 2300.) 60 tablet 3   Multiple Vitamins-Minerals (MULTIVITAMIN WITH MINERALS) tablet Take 1 tablet by mouth at bedtime.     OIL OF OREGANO PO Take 1 capsule by mouth at bedtime.     omeprazole (PRILOSEC) 40 MG capsule Take 40 mg by mouth daily.  5   PHOSPHATIDYL CHOLINE PO Take 2 tablets by mouth at bedtime.     potassium chloride  (MICRO-K ) 10 MEQ CR capsule Take 20 mEq by mouth daily.     Probiotic Product (PROBIOTIC DAILY PO) Take 1 capsule by  mouth at bedtime.     sertraline  (ZOLOFT ) 100 MG tablet Take 100 mg by mouth daily.     simvastatin  (ZOCOR ) 10 MG tablet Take 10 mg by mouth at bedtime.     sitaGLIPtin (JANUVIA) 100 MG tablet Take 100 mg by mouth daily.     SPIRULINA PO Take 1 tablet by mouth at bedtime.     sucralfate (CARAFATE) 1 g tablet Take 1 g by mouth 2 (two) times daily.     tamsulosin  (FLOMAX ) 0.4 MG CAPS capsule Take 1 capsule (0.4 mg total) by mouth daily after breakfast. 1300 90 capsule 3   telmisartan (MICARDIS) 20 MG tablet Take 20 mg by mouth daily.  5   verapamil  (CALAN -SR) 240 MG CR tablet Take 240 mg by mouth daily.     Vitamin D, Ergocalciferol, (DRISDOL) 1.25 MG (50000 UT) CAPS capsule Take 50,000 Units by mouth every Wednesday.  5   No current facility-administered medications on file prior to visit.     No Known Allergies  Social History   Occupational History   Not on file  Tobacco Use   Smoking status: Former    Current packs/day: 0.00    Types: Cigarettes    Quit date: 04/30/1966    Years since quitting: 57.9   Smokeless tobacco: Never  Substance and Sexual Activity   Alcohol use: No   Drug use: No   Sexual activity: Not Currently    Family  History  Problem Relation Age of Onset   Heart attack Mother    Diabetes Mother    Heart attack Father    Diabetes Sister     Immunization History  Administered Date(s) Administered   Influenza,inj,quad, With Preservative 03/17/2019   Pneumococcal Conjugate PCV 7 05/12/2014   Pneumococcal Polysaccharide-23 04/16/2011    Objective: There were no vitals filed for this visit.  Tim Henry is a pleasant 89 y.o. male in NAD. AAO X 3.   Diabetic foot exam was performed with the following findings:    Vascular Examination: CFT <4 seconds b/l. DP pulses diminished b/l. PT pulses diminished b/l. Digital hair absent. Skin temperature gradient warm to cool b/l. No ischemia or gangrene. No cyanosis or clubbing noted b/l. Lymphedema present BLE.   Neurological Examination: Sensation grossly intact b/l with 10 gram monofilament. Vibratory sensation intact b/l. Pt has subjective symptoms of neuropathy.  Dermatological Examination: Pedal skin thin, shiny and atrophic b/l. No open wounds. No interdigital macerations.   Toenails 1-5 b/l thick, discolored, elongated with subungual debris and pain on dorsal palpation.   Incurvated nailplate both borders of left hallux and both borders of right hallux with tenderness to palpation. No erythema, no edema, no drainage noted. No fluctuance.   No hyperkeratotic nor porokeratotic lesions.  Musculoskeletal Examination: Muscle strength 5/5 to all lower extremity muscle groups bilaterally. No pain, crepitus or joint limitation noted with ROM bilateral LE. Pain on palpation posterior aspect of both heels. No open wounds and no blistering. HAV with bunion deformity noted b/l LE.  Radiographs: None  Lab Results  Component Value Date   HGBA1C 6.3 (H) 12/15/2022   Assessment: 1. Pain due to onychomycosis of toenails of both feet   2. History of ulcer of lower extremity   3. Lymphedema   4. Heel pain, bilateral   5. Diabetic peripheral neuropathy  (HCC)   6. Encounter for diabetic foot exam (HCC)      ADA Risk Categorization: High Risk:  Patient has one or more of  the following: Loss of protective sensation Absent pedal pulses Severe Foot deformity History of foot ulcer  Plan: Diabetic foot examination performed today. All patient's and/or POA's questions/concerns addressed on today's visit. Toenails 1-5 b/l debrided in length and girth without incident. Continue foot and shoe inspections daily. Monitor blood glucose per PCP/Endocrinologist's recommendations. Continue soft, supportive shoe gear daily. Report any pedal injuries to medical professional. Call office if there are any questions/concerns. -Discussed heel pain and patient is exhibiting pain from constant pressure. Recommended heel protectors which may be purchased on [employer]. Patient/son advised he must remove these when attempting to go to the bathroom at night. Until he gets the heel protectors, I recommended placing 1-2 pillows under ankles which will allow heels to float. -No invasive procedure(s) performed. Offending nail border debrided and curretaged bilateral great toes utilizing sterile nail nipper and currette. Border(s) cleansed with alcohol and triple antibiotic ointment applied. Patient/POA/Caregiver/Facility instructed to apply Neosporin Cream  to bilateral great toes once daily for 7 days. Call office if there are any concerns. -Patient/POA to call should there be question/concern in the interim.  Return in about 3 months (around 06/05/2024).  Tim Henry, DPM      [place] LOCATION: [address], [place] [address]                   Office [phone]   [facility] LOCATION: [address] [place], [place] [address] Office [phone]

## 2024-03-17 ENCOUNTER — Ambulatory Visit (INDEPENDENT_AMBULATORY_CARE_PROVIDER_SITE_OTHER): Admitting: Vascular Surgery

## 2024-03-17 ENCOUNTER — Encounter (INDEPENDENT_AMBULATORY_CARE_PROVIDER_SITE_OTHER): Payer: Self-pay | Admitting: Vascular Surgery

## 2024-03-17 VITALS — BP 106/61 | HR 58 | Resp 16

## 2024-03-17 DIAGNOSIS — I10 Essential (primary) hypertension: Secondary | ICD-10-CM

## 2024-03-17 DIAGNOSIS — I509 Heart failure, unspecified: Secondary | ICD-10-CM

## 2024-03-17 DIAGNOSIS — E118 Type 2 diabetes mellitus with unspecified complications: Secondary | ICD-10-CM

## 2024-03-17 DIAGNOSIS — E785 Hyperlipidemia, unspecified: Secondary | ICD-10-CM

## 2024-03-17 DIAGNOSIS — I890 Lymphedema, not elsewhere classified: Secondary | ICD-10-CM | POA: Diagnosis not present

## 2024-03-17 NOTE — Assessment & Plan Note (Signed)
 The patient's lymphedema seems to be under good control with compression, elevation, and the moisturizers to his skin.  I doubt his pain is predominantly due to lymphedema and sounds more neuropathic in nature.  I think a low-dose of gabapentin  could be considered at night, but given his advanced age I am concerned about the sedating side effects of this.  He is fairly adamant that he wants something done.  His son says they have ordered a new pressure boot they are going to try to use not asked him to try that for several weeks.  If this still does not give him any relief, we can likely do a very small dose of Neurontin  at night.  Otherwise, I will plan to see him back in 6 months.

## 2024-03-17 NOTE — Progress Notes (Signed)
 MRN : [id number]  Tim Henry is a 89 y.o. (03-13-1928) male who presents with chief complaint of  Chief Complaint  Patient presents with   Follow-up    6 month follow up  .  History of Present Illness: Patient returns today in follow up of his longstanding lymphedema and leg swelling.  His children have done an amazing job of keeping his legs moisturized and they have softened much more than I would have expected.  His swelling is quite mild at this point.  He does still have quite significant stasis dermatitis changes, but the skin has been intact for years now.  His recent problem is more of a neuropathic pain in the left toes and heel area.  He seen a podiatrist and they are working on ways to offload pressure and avoid pain in this area.  So far, these have not worked particularly well.  This is keeping him from sleeping and bothering him quite significantly.  Current Outpatient Medications  Medication Sig Dispense Refill   acetaminophen  (TYLENOL ) 500 MG tablet Take 500 mg by mouth 2 (two) times daily as needed for fever, headache, moderate pain or mild pain.     ALPRAZolam  (XANAX ) 0.5 MG tablet Take 0.5 mg by mouth 2 (two) times daily.     aspirin  EC 81 MG tablet Take 1 tablet (81 mg total) by mouth daily with breakfast. Swallow whole. (Patient taking differently: Take 81 mg by mouth daily with breakfast.) 30 tablet 11   benzonatate  (TESSALON ) 100 MG capsule Take 1 capsule (100 mg total) by mouth 3 (three) times daily as needed for cough. 20 capsule 0   BLACK ELDERBERRY PO Take 1 capsule by mouth at bedtime.     diphenhydrAMINE (BENADRYL) 25 MG tablet Take 25 mg by mouth at bedtime.     ferrous sulfate  325 (65 FE) MG tablet Take 325 mg by mouth at bedtime.     finasteride  (PROSCAR ) 5 MG tablet TAKE ONE TABLET BY MOUTH EVERY DAY 90 tablet 3   furosemide  (LASIX ) 20 MG tablet Take 1 tablet (20 mg total) by mouth daily. 30 tablet 1   gabapentin  (NEURONTIN ) 300 MG capsule Take 300 mg  by mouth 2 (two) times daily with a meal. 1300, 2300     Glucosamine HCl (GLUCOSAMINE PO) Take 2 tablets by mouth at bedtime.     guaiFENesin  (MUCINEX ) 600 MG 12 hr tablet Take 1 tablet (600 mg total) by mouth 2 (two) times daily as needed for cough or to loosen phlegm. 30 tablet 0   latanoprost  (XALATAN ) 0.005 % ophthalmic solution Place 1 drop into both eyes at bedtime.     metFORMIN  (GLUCOPHAGE ) 500 MG tablet Take 1 tablet (500 mg total) by mouth 2 (two) times daily with a meal. 1 PM, 11 PM (Patient taking differently: Take 500 mg by mouth daily with breakfast. 1300, 2300.) 60 tablet 3   Multiple Vitamins-Minerals (MULTIVITAMIN WITH MINERALS) tablet Take 1 tablet by mouth at bedtime.     OIL OF OREGANO PO Take 1 capsule by mouth at bedtime.     omeprazole (PRILOSEC) 40 MG capsule Take 40 mg by mouth daily.  5   PHOSPHATIDYL CHOLINE PO Take 2 tablets by mouth at bedtime.     potassium chloride  (MICRO-K ) 10 MEQ CR capsule Take 20 mEq by mouth daily.     Probiotic Product (PROBIOTIC DAILY PO) Take 1 capsule by mouth at bedtime.     sertraline  (ZOLOFT ) 100 MG tablet Take  100 mg by mouth daily.     simvastatin  (ZOCOR ) 10 MG tablet Take 10 mg by mouth at bedtime.     sitaGLIPtin (JANUVIA) 100 MG tablet Take 100 mg by mouth daily.     SPIRULINA PO Take 1 tablet by mouth at bedtime.     sucralfate (CARAFATE) 1 g tablet Take 1 g by mouth 2 (two) times daily.     tamsulosin  (FLOMAX ) 0.4 MG CAPS capsule Take 1 capsule (0.4 mg total) by mouth daily after breakfast. 1300 90 capsule 3   telmisartan (MICARDIS) 20 MG tablet Take 20 mg by mouth daily.  5   verapamil  (CALAN -SR) 240 MG CR tablet Take 240 mg by mouth daily.     Vitamin D, Ergocalciferol, (DRISDOL) 1.25 MG (50000 UT) CAPS capsule Take 50,000 Units by mouth every Wednesday.  5   No current facility-administered medications for this visit.    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)    none since release from hosp    Past Surgical History:  Procedure Laterality Date   APPENDECTOMY     childhood   BACK SURGERY  1963   CATARACT EXTRACTION W/PHACO Left 09/15/2014   Procedure: CATARACT EXTRACTION PHACO AND INTRAOCULAR LENS PLACEMENT (IOC);  Surgeon: Dene Etienne, MD;  Location: [facility];  Service: Ophthalmology;  Laterality: Left;  DIABETIC PT WOULD LIKE TO HAVE ARRIVAL TIME LATE AM   JOINT REPLACEMENT Left    knee   PHOTOCOAGULATION WITH LASER Right 01/23/2016   Procedure: PHOTOCOAGULATION WITH LASER;  Surgeon: Donzell Arlyce Budd, MD;  Location: [facility];  Service: Ophthalmology;  Laterality: Right;  DIABETIC - oral meds RIGHT Requests arrival 10AM or after     Social History   Tobacco Use   Smoking status: Former    Current packs/day: 0.00    Types: Cigarettes    Quit date: 04/30/1966    Years since quitting: 57.9   Smokeless tobacco: Never  Substance Use Topics   Alcohol use: No   Drug use: No      Family History  Problem Relation Age of Onset   Heart attack Mother    Diabetes Mother    Heart attack Father    Diabetes Sister      No Known Allergies    REVIEW OF SYSTEMS (Negative unless checked)   Constitutional: [] Weight loss  [] Fever  [] Chills Cardiac: [] Chest pain   [] Chest pressure   [] Palpitations   [] Shortness of breath when laying flat   [] Shortness of breath at rest   [] Shortness of breath with exertion. Vascular:  [] Pain in legs with walking   [] Pain in legs at rest   [] Pain in legs when laying flat   [] Claudication   [] Pain in  feet when walking  [] Pain in feet at rest  [] Pain in feet when laying flat   [] History of DVT   [] Phlebitis   [x] Swelling in legs   [] Varicose veins   [x] Non-healing  ulcers Pulmonary:   [] Uses home oxygen   [] Productive cough   [] Hemoptysis   [] Wheeze  [] COPD   [] Asthma Neurologic:  [] Dizziness  [] Blackouts   [] Seizures   [] History of stroke   [] History of TIA  [] Aphasia   [] Temporary blindness   [] Dysphagia   [] Weakness or numbness in arms   [] Weakness or numbness in legs Musculoskeletal:  [x] Arthritis   [] Joint swelling   [] Joint pain   [] Low back pain Hematologic:  [] Easy bruising  [] Easy bleeding   [] Hypercoagulable state   [] Anemic   Gastrointestinal:  [] Blood in stool   [] Vomiting blood  [] Gastroesophageal reflux/heartburn   [] Abdominal pain Genitourinary:  [x] Chronic kidney disease   [] Difficult urination  [] Frequent urination  [] Burning with urination   [] Hematuria Skin:  [] Rashes   [x] Ulcers   [x] Wounds Psychological:  [] History of anxiety   []  History of major depression.   Physical Examination  BP 106/61 (BP Location: Left Arm)   Pulse (!) 58   Resp 16  Gen:  WD/WN, NAD.  Appears far younger than stated age Head: Rule/AT, No temporalis wasting. Ear/Nose/Throat: Hearing grossly intact, nares w/o erythema or drainage Eyes: Conjunctiva clear. Sclera non-icteric Neck: Supple.  Trachea midline Pulmonary:  Good air movement, no use of accessory muscles.  Cardiac: Irregular and bradycardic Vascular:  Vessel Right Left  Radial Palpable Palpable                   Musculoskeletal: M/S 5/5 throughout.  No deformity or atrophy.  Marked stasis dermatitis changes although the skin is very soft and pliable at this point and no longer thickened.  Trace bilateral lower extremity edema. Neurologic: Sensation grossly intact in extremities.  Symmetrical.  Speech is fluent.  Psychiatric: Judgment intact, Mood & affect appropriate for pt's clinical situation. Dermatologic: No rashes or ulcers  noted.  No cellulitis or open wounds.      Labs Recent Results (from the past 2160 hours)  CBG monitoring, ED     Status: Abnormal   Collection Time: 01/21/24  1:58 PM  Result Value Ref Range   Glucose-Capillary 229 (H) 70 - 99 mg/dL    Comment: Glucose reference range applies only to samples taken after fasting for at least 8 hours.  Blood culture (routine x 2)     Status: None   Collection Time: 01/21/24  3:00 PM   Specimen: BLOOD  Result Value Ref Range   Specimen Description BLOOD BLOOD LEFT ARM    Special Requests      BOTTLES DRAWN AEROBIC AND ANAEROBIC Blood Culture adequate volume   Culture      NO GROWTH 5 DAYS Performed at [facility], [address] [place], [place] [address]    Report Status 01/26/2024 FINAL   Blood culture (routine x 2)     Status: None   Collection Time: 01/21/24  3:00 PM   Specimen: BLOOD  Result Value Ref Range   Specimen Description BLOOD BLOOD LEFT HAND    Special Requests      BOTTLES DRAWN AEROBIC ONLY Blood Culture results may not be optimal due to an inadequate volume of blood received in culture bottles   Culture      NO GROWTH 5 DAYS Performed at [facility], [address] [place], [place] [address]    Report Status 01/26/2024 FINAL   Comprehensive metabolic panel     Status: Abnormal   Collection Time: 01/21/24  3:27 PM  Result Value Ref Range   Sodium 142 135 -  145 mmol/L   Potassium 4.0 3.5 - 5.1 mmol/L   Chloride 105 98 - 111 mmol/L   CO2 24 22 - 32 mmol/L   Glucose, Bld 160 (H) 70 - 99 mg/dL    Comment: Glucose reference range applies only to samples taken after fasting for at least 8 hours.   BUN 29 (H) 8 - 23 mg/dL   Creatinine, Ser 8.42 (H) 0.61 - 1.24 mg/dL   Calcium 8.9 8.9 - 89.6 mg/dL   Total Protein 6.6 6.5 - 8.1 g/dL   Albumin  3.6 3.5 - 5.0 g/dL   AST 21 15 - 41 U/L   ALT 15 0 - 44 U/L   Alkaline Phosphatase 62 38 - 126 U/L   Total Bilirubin 0.7 0.0 - 1.2 mg/dL   GFR, Estimated 41 (L) >60 mL/min     Comment: (NOTE) Calculated using the CKD-EPI Creatinine Equation (2021)    Anion gap 13 5 - 15    Comment: Performed at [facility], [address], [place], [place] [address]  Lipase, blood     Status: None   Collection Time: 01/21/24  3:27 PM  Result Value Ref Range   Lipase 51 11 - 51 U/L    Comment: Performed at [facility], [address] [place], [place] [address]  CBC with Diff     Status: Abnormal   Collection Time: 01/21/24  3:27 PM  Result Value Ref Range   WBC 10.5 4.0 - 10.5 K/uL   RBC 3.56 (L) 4.22 - 5.81 MIL/uL   Hemoglobin 11.7 (L) 13.0 - 17.0 g/dL   HCT 62.4 (L) 60.9 - 47.9 %   MCV 105.3 (H) 80.0 - 100.0 fL   MCH 32.9 26.0 - 34.0 pg   MCHC 31.2 30.0 - 36.0 g/dL   RDW 87.3 88.4 - 84.4 %   Platelets 168 150 - 400 K/uL   nRBC 0.0 0.0 - 0.2 %   Neutrophils Relative % 78 %   Neutro Abs 8.2 (H) 1.7 - 7.7 K/uL   Lymphocytes Relative 12 %   Lymphs Abs 1.3 0.7 - 4.0 K/uL   Monocytes Relative 8 %   Monocytes Absolute 0.8 0.1 - 1.0 K/uL   Eosinophils Relative 1 %   Eosinophils Absolute 0.1 0.0 - 0.5 K/uL   Basophils Relative 0 %   Basophils Absolute 0.0 0.0 - 0.1 K/uL   Immature Granulocytes 1 %   Abs Immature Granulocytes 0.05 0.00 - 0.07 K/uL    Comment: Performed at [facility], [address] [place], [place] [address]  Magnesium      Status: None   Collection Time: 01/21/24  3:27 PM  Result Value Ref Range   Magnesium  1.9 1.7 - 2.4 mg/dL    Comment: Performed at [facility], [address] [place], [place] [address]  Troponin I (High Sensitivity)     Status: None   Collection Time: 01/21/24  3:27 PM  Result Value Ref Range   Troponin I (High Sensitivity) 3 <18 ng/L    Comment: (NOTE) Elevated high sensitivity troponin I (hsTnI) values and significant  changes across serial measurements may suggest ACS but many other  chronic and acute conditions are known to elevate hsTnI results.  Refer to the Links section for chest pain algorithms and additional   guidance. Performed at [facility], [address] [place], [place] [address]   Lactic acid, plasma     Status: None   Collection Time: 01/21/24  3:27 PM  Result Value Ref  Range   Lactic Acid, Venous 1.4 0.5 - 1.9 mmol/L    Comment: Performed at [facility], [address] [place], [place] [address]  DIC Panel Once     Status: Abnormal   Collection Time: 01/21/24  3:27 PM  Result Value Ref Range   Prothrombin Time 14.1 11.4 - 15.2 seconds   INR 1.0 0.8 - 1.2    Comment: (NOTE) INR goal varies based on device and disease states.    aPTT 26 24 - 36 seconds   Fibrinogen  397 210 - 475 mg/dL    Comment: (NOTE) Fibrinogen  results may be underestimated in patients receiving thrombolytic therapy.    D-Dimer, Quant 2.59 (H) 0.00 - 0.50 ug/mL-FEU    Comment: (NOTE) At the manufacturer cut-off value of 0.5 g/mL FEU, this assay has a negative predictive value of 95-100%.This assay is intended for use in conjunction with a clinical pretest probability (PTP) assessment model to exclude pulmonary embolism (PE) and deep venous thrombosis (DVT) in outpatients suspected of PE or DVT. Results should be correlated with clinical presentation.    Platelets 159 150 - 400 K/uL   Smear Review Schistocytes present     Comment: Performed at [facility], [address], [place], [place] [address]  Urinalysis, w/ Reflex to Culture (Infection Suspected) -Urine, Clean Catch     Status: None   Collection Time: 01/21/24  3:30 PM  Result Value Ref Range   Specimen Source URINE, CLEAN CATCH    Color, Urine YELLOW YELLOW   APPearance CLEAR CLEAR   Specific Gravity, Urine 1.016 1.005 - 1.030   pH 5.0 5.0 - 8.0   Glucose, UA NEGATIVE NEGATIVE mg/dL   Hgb urine dipstick NEGATIVE NEGATIVE   Bilirubin Urine NEGATIVE NEGATIVE   Ketones, ur NEGATIVE NEGATIVE mg/dL   Protein, ur NEGATIVE NEGATIVE mg/dL   Nitrite NEGATIVE NEGATIVE   Leukocytes,Ua NEGATIVE NEGATIVE   RBC / HPF 0-5 0 - 5 RBC/hpf   WBC, UA  0-5 0 - 5 WBC/hpf    Comment:        Reflex urine culture not performed if WBC <=10, OR if Squamous epithelial cells >5. If Squamous epithelial cells >5 suggest recollection.    Bacteria, UA NONE SEEN NONE SEEN   Squamous Epithelial / HPF 0-5 0 - 5 /HPF   Mucus PRESENT    Hyaline Casts, UA PRESENT     Comment: Performed at [facility], [address], [place], [place] [address]  Lactic acid, plasma     Status: None   Collection Time: 01/21/24  5:16 PM  Result Value Ref Range   Lactic Acid, Venous 1.2 0.5 - 1.9 mmol/L    Comment: Performed at [facility], [address] [place], [place] [address]  Troponin I (High Sensitivity)     Status: None   Collection Time: 01/21/24  5:16 PM  Result Value Ref Range   Troponin I (High Sensitivity) 2 <18 ng/L    Comment: (NOTE) Elevated high sensitivity troponin I (hsTnI) values and significant  changes across serial measurements may suggest ACS but many other  chronic and acute conditions are known to elevate hsTnI results.  Refer to the Links section for chest pain algorithms and additional  guidance. Performed at [facility], [address], [place], [place] [address]   Resp panel by RT-PCR (RSV, Flu A&B, Covid) Anterior Nasal Swab     Status: None   Collection Time: 01/21/24  6:11 PM   Specimen: Anterior Nasal Swab  Result  Value Ref Range   SARS Coronavirus 2 by RT PCR NEGATIVE NEGATIVE    Comment: (NOTE) SARS-CoV-2 target nucleic acids are NOT DETECTED.  The SARS-CoV-2 RNA is generally detectable in upper respiratory specimens during the acute phase of infection. The lowest concentration of SARS-CoV-2 viral copies this assay can detect is 138 copies/mL. A negative result does not preclude SARS-Cov-2 infection and should not be used as the sole basis for treatment or other patient management decisions. A negative result may occur with  improper specimen collection/handling, submission of specimen other than nasopharyngeal swab,  presence of viral mutation(s) within the areas targeted by this assay, and inadequate number of viral copies(<138 copies/mL). A negative result must be combined with clinical observations, patient history, and epidemiological information. The expected result is Negative.  Fact Sheet for Patients:  bloggercourse.com  Fact Sheet for Healthcare Providers:  seriousbroker.it  This test is no t yet approved or cleared by the [place]  FDA and  has been authorized for detection and/or diagnosis of SARS-CoV-2 by FDA under an Emergency Use Authorization (EUA). This EUA will remain  in effect (meaning this test can be used) for the duration of the COVID-19 declaration under Section 564(b)(1) of the Act, 21 U.S.C.section 360bbb-3(b)(1), unless the authorization is terminated  or revoked sooner.       Influenza A by PCR NEGATIVE NEGATIVE   Influenza B by PCR NEGATIVE NEGATIVE    Comment: (NOTE) The Xpert Xpress SARS-CoV-2/FLU/RSV plus assay is intended as an aid in the diagnosis of influenza from Nasopharyngeal swab specimens and should not be used as a sole basis for treatment. Nasal washings and aspirates are unacceptable for Xpert Xpress SARS-CoV-2/FLU/RSV testing.  Fact Sheet for Patients: bloggercourse.com  Fact Sheet for Healthcare Providers: seriousbroker.it  This test is not yet approved or cleared by the [place]  FDA and has been authorized for detection and/or diagnosis of SARS-CoV-2 by FDA under an Emergency Use Authorization (EUA). This EUA will remain in effect (meaning this test can be used) for the duration of the COVID-19 declaration under Section 564(b)(1) of the Act, 21 U.S.C. section 360bbb-3(b)(1), unless the authorization is terminated or revoked.     Resp Syncytial Virus by PCR NEGATIVE NEGATIVE    Comment: (NOTE) Fact Sheet for  Patients: bloggercourse.com  Fact Sheet for Healthcare Providers: seriousbroker.it  This test is not yet approved or cleared by the [place]  FDA and has been authorized for detection and/or diagnosis of SARS-CoV-2 by FDA under an Emergency Use Authorization (EUA). This EUA will remain in effect (meaning this test can be used) for the duration of the COVID-19 declaration under Section 564(b)(1) of the Act, 21 U.S.C. section 360bbb-3(b)(1), unless the authorization is terminated or revoked.  Performed at [facility], [address], [place], [place] [address]     Radiology No results found.  Assessment/Plan  Lymphedema The patient's lymphedema seems to be under good control with compression, elevation, and the moisturizers to his skin.  I doubt his pain is predominantly due to lymphedema and sounds more neuropathic in nature.  I think a low-dose of gabapentin  could be considered at night, but given his advanced age I am concerned about the sedating side effects of this.  He is fairly adamant that he wants something done.  His son says they have ordered a new pressure boot they are going to try to use not asked him to try that for several weeks.  If this still does not give him any relief, we  can likely do a very small dose of Neurontin  at night.  Otherwise, I will plan to see him back in 6 months.  Essential hypertension blood pressure control important in reducing the progression of atherosclerotic disease. On appropriate oral medications.     Type 2 diabetes mellitus with complication (HCC) blood glucose control important in reducing the progression of atherosclerotic disease. Also, involved in wound healing. On appropriate medications.     Hyperlipidemia lipid control important in reducing the progression of atherosclerotic disease. Continue statin therapy   Heart failure (HCC) Can worsen LE swelling  Selinda Gu,  MD  03/17/2024 1:01 PM    This note was created with Dragon medical transcription system.  Any errors from dictation are purely unintentional

## 2024-04-08 ENCOUNTER — Ambulatory Visit: Payer: Self-pay | Admitting: Urology

## 2024-04-08 ENCOUNTER — Encounter: Payer: Self-pay | Admitting: Urology

## 2024-04-08 DIAGNOSIS — N401 Enlarged prostate with lower urinary tract symptoms: Secondary | ICD-10-CM

## 2024-04-08 DIAGNOSIS — R350 Frequency of micturition: Secondary | ICD-10-CM

## 2024-04-08 LAB — BLADDER SCAN AMB NON-IMAGING: Scan Result: 8

## 2024-04-08 MED ORDER — FINASTERIDE 5 MG PO TABS: 5.0000 mg | ORAL_TABLET | Freq: Every day | ORAL | 3 refills | Status: AC

## 2024-04-08 MED ORDER — TAMSULOSIN HCL 0.4 MG PO CAPS: 0.4000 mg | ORAL_CAPSULE | Freq: Every day | ORAL | 3 refills | Status: AC

## 2024-04-08 NOTE — Progress Notes (Signed)
 04/08/2024 10:51 AM   Tim Henry 02-21-1928 [id number]  Referring provider: Nancee Deward [name], MD [address] [place],  [place] [address]  No chief complaint on file.  Urologic history: 1.  BPH with lower urinary tract symptoms Followed since 2015; initially treated with tamsulosin  with significant improvement in his symptoms Finasteride  added for worsening LUTS   2.  Microhematuria evaluation September 2017 Renal ultrasound bilateral simple cysts Cystoscopy with trilobar enlargement/prominent hypervascularity   HPI: Tim Henry is a 89 y.o. male who presents for annual follow-up.  He presents today with his daughter  No problems since last year's visit Stable lower urinary tract symptoms; notes occasional urge and occasional split urinary stream Remains on tamsulosin /finasteride .  Denies dysuria, gross hematuria.  Denies flank, abdominal, or pelvic pain.   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)    none since release from hosp    Surgical History: Past Surgical History:  Procedure Laterality Date   APPENDECTOMY     childhood   BACK SURGERY  1963   CATARACT EXTRACTION W/PHACO Left 09/15/2014   Procedure: CATARACT EXTRACTION PHACO AND INTRAOCULAR LENS PLACEMENT (IOC);  Surgeon: Dene Etienne, MD;  Location: [facility];  Service: Ophthalmology;  Laterality: Left;  DIABETIC PT WOULD LIKE TO HAVE ARRIVAL TIME LATE AM   JOINT REPLACEMENT Left    knee   PHOTOCOAGULATION WITH LASER Right 01/23/2016   Procedure:  PHOTOCOAGULATION WITH LASER;  Surgeon: Donzell Arlyce Budd, MD;  Location: [facility];  Service: Ophthalmology;  Laterality: Right;  DIABETIC - oral meds RIGHT Requests arrival 10AM or after    Home Medications:  Allergies as of 04/08/2024   No Known Allergies      Medication List        Accurate as of April 08, 2024 10:51 AM. If you have any questions, ask your nurse or doctor.          acetaminophen  500 MG tablet Commonly known as: TYLENOL  Take 500 mg by mouth 2 (two) times daily as needed for fever, headache, moderate pain or mild pain.   ALPRAZolam  0.5 MG tablet Commonly known as: XANAX  Take 0.5 mg by mouth 2 (two) times daily.   aspirin  EC 81 MG tablet Take 1 tablet (81 mg total) by mouth daily with breakfast. Swallow whole. What changed: additional instructions   benzonatate  100 MG capsule Commonly known as: TESSALON  Take 1 capsule (100 mg total) by mouth 3 (three) times daily as needed for cough.   BLACK ELDERBERRY PO Take 1 capsule by mouth at bedtime.   diphenhydrAMINE 25 MG tablet Commonly known as: BENADRYL Take 25 mg by mouth at bedtime.   ferrous sulfate  325 (65 FE) MG tablet Take 325 mg by mouth at bedtime.   finasteride  5 MG tablet Commonly known as: PROSCAR  TAKE ONE TABLET BY MOUTH EVERY DAY   furosemide  20 MG tablet Commonly known as: LASIX  Take 1 tablet (20 mg total) by  mouth daily.   gabapentin  300 MG capsule Commonly known as: NEURONTIN  Take 300 mg by mouth 2 (two) times daily with a meal. 1300, 2300   GLUCOSAMINE PO Take 2 tablets by mouth at bedtime.   guaiFENesin  600 MG 12 hr tablet Commonly known as: MUCINEX  Take 1 tablet (600 mg total) by mouth 2 (two) times daily as needed for cough or to loosen phlegm.   latanoprost  0.005 % ophthalmic solution Commonly known as: XALATAN  Place 1 drop into both eyes at bedtime.   metFORMIN  500 MG tablet Commonly known as: GLUCOPHAGE  Take 1 tablet (500 mg total) by mouth  2 (two) times daily with a meal. 1 PM, 11 PM What changed:  when to take this additional instructions   multivitamin with minerals tablet Take 1 tablet by mouth at bedtime.   OIL OF OREGANO PO Take 1 capsule by mouth at bedtime.   omeprazole 40 MG capsule Commonly known as: PRILOSEC Take 40 mg by mouth daily.   PHOSPHATIDYL CHOLINE PO Take 2 tablets by mouth at bedtime.   potassium chloride  10 MEQ CR capsule Commonly known as: MICRO-K  Take 20 mEq by mouth daily.   PROBIOTIC DAILY PO Take 1 capsule by mouth at bedtime.   sertraline  100 MG tablet Commonly known as: ZOLOFT  Take 100 mg by mouth daily.   simvastatin  10 MG tablet Commonly known as: ZOCOR  Take 10 mg by mouth at bedtime.   sitaGLIPtin 100 MG tablet Commonly known as: JANUVIA Take 100 mg by mouth daily.   SPIRULINA PO Take 1 tablet by mouth at bedtime.   sucralfate 1 g tablet Commonly known as: CARAFATE Take 1 g by mouth 2 (two) times daily.   tamsulosin  0.4 MG Caps capsule Commonly known as: FLOMAX  Take 1 capsule (0.4 mg total) by mouth daily after breakfast. 1300   telmisartan 20 MG tablet Commonly known as: MICARDIS Take 20 mg by mouth daily.   verapamil  240 MG CR tablet Commonly known as: CALAN -SR Take 240 mg by mouth daily.   Vitamin D (Ergocalciferol) 1.25 MG (50000 UNIT) Caps capsule Commonly known as: DRISDOL Take 50,000 Units by mouth every Wednesday.        Allergies: No Known Allergies  Family History: Family History  Problem Relation Age of Onset   Heart attack Mother    Diabetes Mother    Heart attack Father    Diabetes Sister     Social History:  reports that he quit smoking about 57 years ago. His smoking use included cigarettes. He has never used smokeless tobacco. He reports that he does not drink alcohol and does not use drugs.   Physical Exam: BP 90/60   Pulse 81   Ht 5' 11 (1.803 m)   Wt 220 lb (99.8 kg)   BMI 30.68 kg/m   Constitutional:  Alert, No  acute distress. HEENT: [place] AT Respiratory: Normal respiratory effort, no increased work of breathing. Psychiatric: Normal mood and affect.   Assessment & Plan:    1. Benign prostatic hyperplasia with LUTS PVR today 8 mL Tamsulosin /finasteride  refilled Continue annual follow-up   Glendia Tim Henry Barba, MD  [facility] [address] [place], [place] [address] [phone]

## 2024-05-13 ENCOUNTER — Ambulatory Visit: Payer: Self-pay

## 2024-05-13 ENCOUNTER — Other Ambulatory Visit: Payer: Self-pay

## 2024-05-13 NOTE — Telephone Encounter (Signed)
 FYI Only or Action Required?: Action required by provider: medication refill request.  Patient was last seen in primary care on n/a.  Called Nurse Triage reporting Medication Refill.  Symptoms began n/a.  Interventions attempted: Other: n/a.  Symptoms are: n/a.  Triage Disposition: Call PCP Now  Patient/caregiver understands and will follow disposition?: Yes     Copied from CRM [id number]. Topic: Clinical - Medication Question >> May 13, 2024 11:54 AM Tim Henry wrote: Reason for CRM: Tim Henry from [facility] has input a pres for pt as he will be establishing care in April and has been released from prev PCP Tim Henry. Pharm would like to know if a courtesy fill can be made until pt visits Tim in April. Transferred to nurse Reason for Disposition  [1] Pharmacy calling with prescription questions AND [2] triager unable to answer question  Answer Assessment - Initial Assessment Questions Tim Henry from pharmacy called. Pt is establishing with Tim Henry. Pt's previous provider is refusing to send in any more refills for patient as he was discharged from the practice. Pharmacy is requesting if Tim Henry would be willing to refill them until his appt date. He is one of their complaince packaging patients so the medications come in blister packs for him. He will be out of medications on 1.26.26  Medications pharmacy is requesting. Verapamil  240mg  daily Sertraline  100mg  daily Sucralfate 1 gram bid Potassium 10mEq 2 caps daily Telmisartan 20mg  daily Omeprazole 40mg  daily Januvia 100mg  daily Lasix  20mg  BID Baby aspirin  81mg  daily Gabapentin  300mg  BID Alprazolam  0.5mg  bid    1. DRUG NAME: What medicine do you need to have refilled?     See above 2. REFILLS REMAINING: How many refills are remaining? Notes: The label on the medicine or pill bottle will show how many refills are remaining. If there are no refills remaining, then a renewal may be needed.     non 3.  EXPIRATION DATE: What is the expiration date? Note: The label states when the prescription will expire, and thus can no longer be refilled.)      4. PRESCRIBER: Who prescribed it? Note: The prescribing doctor or group is responsible for refill approvals..     Previous provider 5. PHARMACY: Have you contacted your pharmacy (drugstore)? Note: Some pharmacies will contact the doctor (or NP/PA).      Call from pharmacy  Protocols used: Medication Refill and Renewal Call-A-AH

## 2024-06-03 ENCOUNTER — Telehealth: Payer: Self-pay

## 2024-06-03 NOTE — Telephone Encounter (Signed)
 Spoke to daughter, has been took care of

## 2024-06-03 NOTE — Telephone Encounter (Signed)
 Copied from CRM #[id number]. Topic: Clinical - Medication Question >> Jun 02, 2024  4:42 PM Roselie BROCKS wrote: Reason for CRM: Patients daughter , Eleni, calling states [facility] [place] requests a list of patients medications to confirm which needs refilled. Patient does have a new patient appnt with Laura Huenink 08-26-24 [facility] requests a return call concerning this.

## 2024-06-15 ENCOUNTER — Ambulatory Visit: Admitting: Podiatry

## 2024-07-28 ENCOUNTER — Ambulatory Visit: Admitting: Cardiology

## 2024-08-26 ENCOUNTER — Ambulatory Visit: Payer: Self-pay

## 2024-09-15 ENCOUNTER — Ambulatory Visit (INDEPENDENT_AMBULATORY_CARE_PROVIDER_SITE_OTHER): Admitting: Vascular Surgery

## 2025-04-09 ENCOUNTER — Ambulatory Visit: Admitting: Urology
